# Patient Record
Sex: Male | Born: 1945 | Race: White | Hispanic: No | Marital: Married | Smoking: Never smoker
Health system: Southern US, Community
[De-identification: ages and names within clinical notes are randomized; demographics above are authoritative.]

## PROBLEM LIST (undated history)

## (undated) DIAGNOSIS — I10 Essential (primary) hypertension: Secondary | ICD-10-CM

## (undated) DIAGNOSIS — G4730 Sleep apnea, unspecified: Secondary | ICD-10-CM

## (undated) DIAGNOSIS — M1990 Unspecified osteoarthritis, unspecified site: Secondary | ICD-10-CM

## (undated) DIAGNOSIS — E119 Type 2 diabetes mellitus without complications: Secondary | ICD-10-CM

## (undated) DIAGNOSIS — J45909 Unspecified asthma, uncomplicated: Secondary | ICD-10-CM

## (undated) DIAGNOSIS — E785 Hyperlipidemia, unspecified: Secondary | ICD-10-CM

## (undated) HISTORY — DX: Essential (primary) hypertension: I10

## (undated) HISTORY — DX: Type 2 diabetes mellitus without complications: E11.9

## (undated) HISTORY — PX: COLONOSCOPY: SHX174

## (undated) HISTORY — DX: Sleep apnea, unspecified: G47.30

## (undated) HISTORY — PX: HERNIA REPAIR: SHX51

## (undated) HISTORY — DX: Unspecified asthma, uncomplicated: J45.909

## (undated) HISTORY — DX: Hyperlipidemia, unspecified: E78.5

---

## 2011-06-29 ENCOUNTER — Ambulatory Visit: Payer: Self-pay | Admitting: Gastroenterology

## 2011-06-29 LAB — HM COLONOSCOPY

## 2013-01-19 DIAGNOSIS — Z23 Encounter for immunization: Secondary | ICD-10-CM | POA: Diagnosis not present

## 2013-02-12 DIAGNOSIS — E7800 Pure hypercholesterolemia, unspecified: Secondary | ICD-10-CM | POA: Diagnosis not present

## 2013-02-12 DIAGNOSIS — Z0000 Encounter for general adult medical examination without abnormal findings: Secondary | ICD-10-CM | POA: Diagnosis not present

## 2013-02-12 DIAGNOSIS — Z125 Encounter for screening for malignant neoplasm of prostate: Secondary | ICD-10-CM | POA: Diagnosis not present

## 2013-02-12 DIAGNOSIS — I10 Essential (primary) hypertension: Secondary | ICD-10-CM | POA: Diagnosis not present

## 2013-02-12 DIAGNOSIS — E119 Type 2 diabetes mellitus without complications: Secondary | ICD-10-CM | POA: Diagnosis not present

## 2013-02-26 DIAGNOSIS — J019 Acute sinusitis, unspecified: Secondary | ICD-10-CM | POA: Diagnosis not present

## 2013-06-03 DIAGNOSIS — E119 Type 2 diabetes mellitus without complications: Secondary | ICD-10-CM | POA: Diagnosis not present

## 2013-06-03 DIAGNOSIS — E785 Hyperlipidemia, unspecified: Secondary | ICD-10-CM | POA: Diagnosis not present

## 2013-12-29 DIAGNOSIS — R062 Wheezing: Secondary | ICD-10-CM | POA: Diagnosis not present

## 2013-12-29 DIAGNOSIS — Z23 Encounter for immunization: Secondary | ICD-10-CM | POA: Diagnosis not present

## 2013-12-29 DIAGNOSIS — J209 Acute bronchitis, unspecified: Secondary | ICD-10-CM | POA: Diagnosis not present

## 2014-05-13 DIAGNOSIS — I10 Essential (primary) hypertension: Secondary | ICD-10-CM | POA: Diagnosis not present

## 2014-05-13 DIAGNOSIS — E785 Hyperlipidemia, unspecified: Secondary | ICD-10-CM | POA: Diagnosis not present

## 2014-05-13 DIAGNOSIS — E119 Type 2 diabetes mellitus without complications: Secondary | ICD-10-CM | POA: Diagnosis not present

## 2014-08-31 ENCOUNTER — Other Ambulatory Visit: Payer: Self-pay | Admitting: Family Medicine

## 2014-09-01 NOTE — Telephone Encounter (Signed)
Shouldn't be due until September- can we check with pharmacy?

## 2014-09-01 NOTE — Telephone Encounter (Signed)
Called and spoke with pharmacy. Patients prescription had [facility], they are calling to get it from the other pharmacy.

## 2014-11-13 ENCOUNTER — Telehealth: Payer: Self-pay

## 2014-11-13 DIAGNOSIS — I152 Hypertension secondary to endocrine disorders: Secondary | ICD-10-CM | POA: Insufficient documentation

## 2014-11-13 DIAGNOSIS — E1169 Type 2 diabetes mellitus with other specified complication: Secondary | ICD-10-CM | POA: Insufficient documentation

## 2014-11-13 DIAGNOSIS — E1165 Type 2 diabetes mellitus with hyperglycemia: Secondary | ICD-10-CM | POA: Insufficient documentation

## 2014-11-13 DIAGNOSIS — E785 Hyperlipidemia, unspecified: Secondary | ICD-10-CM | POA: Insufficient documentation

## 2014-11-13 DIAGNOSIS — E119 Type 2 diabetes mellitus without complications: Secondary | ICD-10-CM | POA: Insufficient documentation

## 2014-11-13 DIAGNOSIS — I10 Essential (primary) hypertension: Secondary | ICD-10-CM | POA: Insufficient documentation

## 2014-11-13 NOTE — Telephone Encounter (Signed)
[facility] [address] requesting refill Benazepril 40mg  Tab

## 2014-11-13 NOTE — Telephone Encounter (Signed)
Needs an appointment. Will get him enough medicine to make it to appointment when it's booked.   

## 2014-11-13 NOTE — Telephone Encounter (Signed)
No answer, voicemail left for patient to call and schedule an appointment, tell person scheduling to notify clinical staff that medication has been sent

## 2014-11-16 NOTE — Telephone Encounter (Signed)
Spoke with patients wife, she told patient to call and schedule an appointment.

## 2014-11-17 MED ORDER — BENAZEPRIL HCL 40 MG PO TABS: 40.0000 mg | ORAL_TABLET | Freq: Every day | ORAL | Status: DC

## 2014-11-17 NOTE — Telephone Encounter (Signed)
Rx sent to his pharmacy

## 2014-11-17 NOTE — Addendum Note (Signed)
Addended by: Valerie Roys on: 11/17/2014 02:37 PM   Modules accepted: Orders

## 2014-11-17 NOTE — Telephone Encounter (Signed)
Appointment made for 12/17/14

## 2014-12-17 ENCOUNTER — Encounter: Payer: Medicare Other | Admitting: Family Medicine

## 2014-12-22 ENCOUNTER — Other Ambulatory Visit: Payer: Self-pay | Admitting: Family Medicine

## 2014-12-22 NOTE — Telephone Encounter (Signed)
Your patient 

## 2014-12-30 ENCOUNTER — Encounter: Payer: Self-pay | Admitting: Family Medicine

## 2014-12-30 ENCOUNTER — Ambulatory Visit (INDEPENDENT_AMBULATORY_CARE_PROVIDER_SITE_OTHER): Payer: Medicare Other | Admitting: Family Medicine

## 2014-12-30 VITALS — BP 118/77 | HR 76 | Temp 98.1°F | Ht 70.0 in | Wt 245.0 lb

## 2014-12-30 DIAGNOSIS — E785 Hyperlipidemia, unspecified: Secondary | ICD-10-CM

## 2014-12-30 DIAGNOSIS — E119 Type 2 diabetes mellitus without complications: Secondary | ICD-10-CM | POA: Diagnosis not present

## 2014-12-30 DIAGNOSIS — N400 Enlarged prostate without lower urinary tract symptoms: Secondary | ICD-10-CM | POA: Diagnosis not present

## 2014-12-30 DIAGNOSIS — Z23 Encounter for immunization: Secondary | ICD-10-CM

## 2014-12-30 DIAGNOSIS — I10 Essential (primary) hypertension: Secondary | ICD-10-CM

## 2014-12-30 DIAGNOSIS — L821 Other seborrheic keratosis: Secondary | ICD-10-CM | POA: Diagnosis not present

## 2014-12-30 DIAGNOSIS — Z0000 Encounter for general adult medical examination without abnormal findings: Secondary | ICD-10-CM

## 2014-12-30 DIAGNOSIS — Z125 Encounter for screening for malignant neoplasm of prostate: Secondary | ICD-10-CM

## 2014-12-30 DIAGNOSIS — L570 Actinic keratosis: Secondary | ICD-10-CM | POA: Diagnosis not present

## 2014-12-30 LAB — URINALYSIS, ROUTINE W REFLEX MICROSCOPIC
Bilirubin, UA: NEGATIVE
Ketones, UA: NEGATIVE
Leukocytes, UA: NEGATIVE
Nitrite, UA: NEGATIVE
PH UA: 5 (ref 5.0–7.5)
Protein, UA: NEGATIVE
RBC, UA: NEGATIVE
Specific Gravity, UA: 1.025 (ref 1.005–1.030)
Urobilinogen, Ur: 0.2 mg/dL (ref 0.2–1.0)

## 2014-12-30 LAB — MICROALBUMIN, URINE WAIVED
CREATININE, URINE WAIVED: 200 mg/dL (ref 10–300)
Microalb, Ur Waived: 30 mg/L — ABNORMAL HIGH (ref 0–19)
Microalb/Creat Ratio: <30 mg/g (ref <30)

## 2014-12-30 LAB — BAYER DCA HB A1C WAIVED: HB A1C (BAYER DCA - WAIVED): 11.9 % — ABNORMAL HIGH (ref <7.0)

## 2014-12-30 MED ORDER — CANAGLIFLOZIN 300 MG PO TABS: 300.0000 mg | ORAL_TABLET | Freq: Every day | ORAL | Status: DC

## 2014-12-30 MED ORDER — BENAZEPRIL HCL 40 MG PO TABS: ORAL_TABLET | ORAL | Status: DC

## 2014-12-30 MED ORDER — SITAGLIPTIN PHOSPHATE 100 MG PO TABS: 100.0000 mg | ORAL_TABLET | Freq: Every day | ORAL | Status: DC

## 2014-12-30 MED ORDER — SAXAGLIPTIN HCL 5 MG PO TABS: 5.0000 mg | ORAL_TABLET | Freq: Every day | ORAL | Status: DC

## 2014-12-30 MED ORDER — ATORVASTATIN CALCIUM 20 MG PO TABS: 20.0000 mg | ORAL_TABLET | Freq: Every day | ORAL | Status: DC

## 2014-12-30 MED ORDER — METFORMIN HCL 500 MG PO TABS: 1000.0000 mg | ORAL_TABLET | Freq: Two times a day (BID) | ORAL | Status: DC

## 2014-12-30 MED ORDER — DAPAGLIFLOZIN PROPANEDIOL 10 MG PO TABS: 10.0000 mg | ORAL_TABLET | Freq: Every day | ORAL | Status: DC

## 2014-12-30 NOTE — Assessment & Plan Note (Addendum)
Discuss hemoglobin A1c terribly high at 11.9 extreme danger. Patient will begin aggressive dieting We'll start Onglyza and [place] Patient education given on May need to use alternative brands depending on cost patient will check with pharmacy Patient will check blood sugar once a day very and times for the next month Will recheck 1 month to assess improvement and control.

## 2014-12-30 NOTE — Addendum Note (Signed)
Addended byGolden Pop on: 12/30/2014 11:34 AM   Modules accepted: Orders, Medications, SmartSet

## 2014-12-30 NOTE — Addendum Note (Signed)
Addended byGolden Pop on: 12/30/2014 09:24 AM   Modules accepted: Orders, SmartSet

## 2014-12-30 NOTE — Addendum Note (Signed)
Addended byGolden Pop on: 12/30/2014 01:58 PM   Modules accepted: Orders, SmartSet

## 2014-12-30 NOTE — Progress Notes (Addendum)
BP 118/77 mmHg  Pulse 76  Temp(Src) 98.1 F (36.7 C)  Ht 5\' 10"  (1.778 m)  Wt 245 lb (111.131 kg)  BMI 35.15 kg/m2  SpO2 96%   Subjective:    Patient ID: Matthew [name], male    DOB: 04/05/1945, 69 y.o.   MRN: [id number]  HPI: Matthew [name] is a 69 y.o. male  Chief Complaint  Patient presents with  . Annual Exam   patient also for recheck of hypercholesterol taking Lipitor with no complaints takes medicines every day Takes Benzapril for hypertension doing well with good control of blood pressure no side effects Takes metformin to 500 mg tablets twice a day with no side effects noted low blood sugar spells did not get Januvia because it was too expensive and has noticed blood sugars have been elevated. Patient is lost 20 pounds but gained some back and is overall 11 pounds down so far this year.   Relevant past medical, surgical, family and social history reviewed and updated as indicated. Interim medical history since our last visit reviewed. Allergies and medications reviewed and updated.  Review of Systems  Constitutional: Negative.   HENT: Negative.        Wife complaints of patient not hearing well Patient uses Q-tips to clean ears  Eyes: Negative.   Respiratory: Negative.   Cardiovascular: Negative.   Gastrointestinal: Negative.   Endocrine: Negative.   Genitourinary: Negative.   Musculoskeletal: Negative.   Skin: Negative.        Patient with multiple actinic keratosis and seborrheic keratosis will occasionally scratching bleed primarily on patient's face  Allergic/Immunologic: Negative.   Neurological: Negative.   Hematological: Negative.   Psychiatric/Behavioral: Negative.     Per HPI unless specifically indicated above     Objective:    BP 118/77 mmHg  Pulse 76  Temp(Src) 98.1 F (36.7 C)  Ht 5\' 10"  (1.778 m)  Wt 245 lb (111.131 kg)  BMI 35.15 kg/m2  SpO2 96%  Wt Readings from Last 3 Encounters:  12/30/14 245 lb (111.131 kg)  05/13/14 254  lb (115.214 kg)    Physical Exam  Constitutional: He is oriented to person, place, and time. He appears well-developed and well-nourished.  HENT:  Head: Normocephalic and atraumatic.  Right Ear: External ear normal.  Left Ear: External ear normal.  Patient with impacted cerumen both ears Removed with water and instruments revealing normal canal and TM and relief of hearing symptoms.  Eyes: Conjunctivae and EOM are normal. Pupils are equal, round, and reactive to light.  Neck: Normal range of motion. Neck supple.  Cardiovascular: Normal rate, regular rhythm, normal heart sounds and intact distal pulses.   Pulmonary/Chest: Effort normal and breath sounds normal.  Abdominal: Soft. Bowel sounds are normal. There is no splenomegaly or hepatomegaly.  Genitourinary: Rectum normal and penis normal.  Prostate enlarged  Musculoskeletal: Normal range of motion.  Neurological: He is alert and oriented to person, place, and time. He has normal reflexes.  Skin: No rash noted. No erythema.  20+ actinic keratosis seborrheic keratosis destroyed on patient's face cryo-was used for destruction   Psychiatric: He has a normal mood and affect. His behavior is normal. Judgment and thought content normal.    No results found for this or any previous visit.    Assessment & Plan:   Problem List Items Addressed This Visit      Cardiovascular and Mediastinum   Hypertension   Relevant Medications   aspirin EC 325 MG tablet  atorvastatin (LIPITOR) 20 MG tablet   benazepril (LOTENSIN) 40 MG tablet   Other Relevant Orders   Comprehensive metabolic panel   CBC with Differential/Platelet   TSH     Endocrine   Diabetes mellitus without complication (HCC)    Discuss hemoglobin A1c terribly high at 11.9 extreme danger. Patient will begin aggressive dieting We'll start Onglyza and [place] Patient education given on May need to use alternative brands depending on cost patient will check with  pharmacy Patient will check blood sugar once a day very and times for the next month Will recheck 1 month to assess improvement and control.      Relevant Medications   aspirin EC 325 MG tablet   saxagliptin HCl (ONGLYZA) 5 MG TABS tablet   dapagliflozin propanediol (FARXIGA) 10 MG TABS tablet   atorvastatin (LIPITOR) 20 MG tablet   benazepril (LOTENSIN) 40 MG tablet   metFORMIN (GLUCOPHAGE) 500 MG tablet   Other Relevant Orders   Bayer DCA Hb A1c Waived   Comprehensive metabolic panel   CBC with Differential/Platelet   Urinalysis, Routine w reflex microscopic (not at [facility])   TSH   Microalbumin, Urine Waived     Genitourinary   BPH (benign prostatic hyperplasia)   Relevant Orders   PSA   Urinalysis, Routine w reflex microscopic (not at [facility])     Other   Hyperlipidemia   Relevant Medications   aspirin EC 325 MG tablet   atorvastatin (LIPITOR) 20 MG tablet   benazepril (LOTENSIN) 40 MG tablet   Other Relevant Orders   Comprehensive metabolic panel   Lipid panel   CBC with Differential/Platelet   Urinalysis, Routine w reflex microscopic (not at [facility])   TSH    Other Visit Diagnoses    Immunization due    -  Primary    Relevant Orders    Flu Vaccine QUAD 36+ mos PF IM (Fluarix & Fluzone Quad PF) (Completed)    Pneumococcal conjugate vaccine 13-valent IM (Completed)    PE (physical exam), annual            Follow up plan: Return in about 4 weeks (around 01/27/2015), or if symptoms worsen or fail to improve, for Recheck glucose log.

## 2014-12-30 NOTE — Addendum Note (Signed)
Addended byGolden Pop on: 12/30/2014 10:38 AM   Modules accepted: Miquel Dunn

## 2014-12-31 ENCOUNTER — Encounter: Payer: Self-pay | Admitting: Family Medicine

## 2014-12-31 LAB — CBC WITH DIFFERENTIAL/PLATELET
BASOS ABS: 0 10*3/uL (ref 0.0–0.2)
Basos: 0 %
EOS (ABSOLUTE): 0.2 10*3/uL (ref 0.0–0.4)
Eos: 3 %
Hematocrit: 43.5 % (ref 37.5–51.0)
Hemoglobin: 14.4 g/dL (ref 12.6–17.7)
IMMATURE GRANS (ABS): 0 10*3/uL (ref 0.0–0.1)
IMMATURE GRANULOCYTES: 0 %
LYMPHS: 32 %
Lymphocytes Absolute: 2 10*3/uL (ref 0.7–3.1)
MCH: 28.3 pg (ref 26.6–33.0)
MCHC: 33.1 g/dL (ref 31.5–35.7)
MCV: 86 fL (ref 79–97)
Monocytes Absolute: 0.6 10*3/uL (ref 0.1–0.9)
Monocytes: 10 %
NEUTROS PCT: 55 %
Neutrophils Absolute: 3.4 10*3/uL (ref 1.4–7.0)
PLATELETS: 232 10*3/uL (ref 150–379)
RBC: 5.09 x10E6/uL (ref 4.14–5.80)
RDW: 13.6 % (ref 12.3–15.4)
WBC: 6.2 10*3/uL (ref 3.4–10.8)

## 2014-12-31 LAB — LIPID PANEL
CHOL/HDL RATIO: 3.3 ratio (ref 0.0–5.0)
CHOLESTEROL TOTAL: 151 mg/dL (ref 100–199)
HDL: 46 mg/dL (ref >39)
LDL CALC: 81 mg/dL (ref 0–99)
Triglycerides: 118 mg/dL (ref 0–149)
VLDL Cholesterol Cal: 24 mg/dL (ref 5–40)

## 2014-12-31 LAB — COMPREHENSIVE METABOLIC PANEL
A/G RATIO: 2 (ref 1.1–2.5)
ALK PHOS: 75 IU/L (ref 39–117)
ALT: 28 IU/L (ref 0–44)
AST: 19 IU/L (ref 0–40)
Albumin: 4.3 g/dL (ref 3.6–4.8)
BILIRUBIN TOTAL: 0.4 mg/dL (ref 0.0–1.2)
BUN/Creatinine Ratio: 20 (ref 10–22)
BUN: 19 mg/dL (ref 8–27)
CHLORIDE: 98 mmol/L (ref 97–106)
CO2: 22 mmol/L (ref 18–29)
Calcium: 9.5 mg/dL (ref 8.6–10.2)
Creatinine, Ser: 0.94 mg/dL (ref 0.76–1.27)
GFR calc Af Amer: 97 mL/min/{1.73_m2} (ref >59)
GFR calc non Af Amer: 84 mL/min/{1.73_m2} (ref >59)
GLUCOSE: 265 mg/dL — AB (ref 65–99)
Globulin, Total: 2.2 g/dL (ref 1.5–4.5)
POTASSIUM: 4.5 mmol/L (ref 3.5–5.2)
Sodium: 136 mmol/L (ref 136–144)
Total Protein: 6.5 g/dL (ref 6.0–8.5)

## 2014-12-31 LAB — PSA: PROSTATE SPECIFIC AG, SERUM: 1 ng/mL (ref 0.0–4.0)

## 2014-12-31 LAB — TSH: TSH: 2.1 u[IU]/mL (ref 0.450–4.500)

## 2015-01-26 ENCOUNTER — Encounter: Payer: Self-pay | Admitting: Family Medicine

## 2015-01-26 ENCOUNTER — Ambulatory Visit (INDEPENDENT_AMBULATORY_CARE_PROVIDER_SITE_OTHER): Payer: Medicare Other | Admitting: Family Medicine

## 2015-01-26 VITALS — BP 115/69 | HR 76 | Temp 98.6°F | Ht 70.0 in | Wt 238.0 lb

## 2015-01-26 DIAGNOSIS — E785 Hyperlipidemia, unspecified: Secondary | ICD-10-CM

## 2015-01-26 DIAGNOSIS — I10 Essential (primary) hypertension: Secondary | ICD-10-CM | POA: Diagnosis not present

## 2015-01-26 DIAGNOSIS — E119 Type 2 diabetes mellitus without complications: Secondary | ICD-10-CM

## 2015-01-26 NOTE — Progress Notes (Signed)
BP 115/69 mmHg  Pulse 76  Temp(Src) 98.6 F (37 C)  Ht 5\' 10"  (1.778 m)  Wt 238 lb (107.956 kg)  BMI 34.15 kg/m2  SpO2 96%   Subjective:    Patient ID: Matthew [name], male    DOB: [birth year], 69 y.o.   MRN: [id number]  HPI: Matthew [name] is a 69 y.o. male  Chief Complaint  Patient presents with  . Diabetes   agents been working with pharmacist gets list of medicines he can afford has bought insurance that will start January 1 in the meantime she is to free coupon but is running out Has lost 7 pounds and blood sugars dropped dramatically into the low 200s and mid 100s. No complaints from medications Patient also getting blood pressure cholesterol medicine and has a savings on that.   Relevant past medical, surgical, family and social history reviewed and updated as indicated. Interim medical history since our last visit reviewed. Allergies and medications reviewed and updated.  Review of Systems  Constitutional: Negative.   Respiratory: Negative.   Cardiovascular: Negative.     Per HPI unless specifically indicated above     Objective:    BP 115/69 mmHg  Pulse 76  Temp(Src) 98.6 F (37 C)  Ht 5\' 10"  (1.778 m)  Wt 238 lb (107.956 kg)  BMI 34.15 kg/m2  SpO2 96%  Wt Readings from Last 3 Encounters:  01/26/15 238 lb (107.956 kg)  12/30/14 245 lb (111.131 kg)  05/13/14 254 lb (115.214 kg)    Physical Exam  Constitutional: He is oriented to person, place, and time. He appears well-developed and well-nourished. No distress.  HENT:  Head: Normocephalic and atraumatic.  Right Ear: Hearing normal.  Left Ear: Hearing normal.  Nose: Nose normal.  Eyes: Conjunctivae and lids are normal. Right eye exhibits no discharge. Left eye exhibits no discharge. No scleral icterus.  Cardiovascular: Normal rate, regular rhythm and normal heart sounds.   Pulmonary/Chest: Effort normal and breath sounds normal. No respiratory distress.  Musculoskeletal: Normal range of motion.   Neurological: He is alert and oriented to person, place, and time.  Skin: Skin is intact. No rash noted.  Psychiatric: He has a normal mood and affect. His speech is normal and behavior is normal. Judgment and thought content normal. Cognition and memory are normal.    Results for orders placed or performed in visit on 12/30/14  Bayer DCA Hb A1c Waived  Result Value Ref Range   Bayer DCA Hb A1c Waived 11.9 (H) <7.0 %  Comprehensive metabolic panel  Result Value Ref Range   Glucose 265 (H) 65 - 99 mg/dL   BUN 19 8 - 27 mg/dL   Creatinine, Ser 0.94 0.76 - 1.27 mg/dL   GFR calc non Af Amer 84 >59 mL/min/1.73   GFR calc Af Amer 97 >59 mL/min/1.73   BUN/Creatinine Ratio 20 10 - 22   Sodium 136 136 - 144 mmol/L   Potassium 4.5 3.5 - 5.2 mmol/L   Chloride 98 97 - 106 mmol/L   CO2 22 18 - 29 mmol/L   Calcium 9.5 8.6 - 10.2 mg/dL   Total Protein 6.5 6.0 - 8.5 g/dL   Albumin 4.3 3.6 - 4.8 g/dL   Globulin, Total 2.2 1.5 - 4.5 g/dL   Albumin/Globulin Ratio 2.0 1.1 - 2.5   Bilirubin Total 0.4 0.0 - 1.2 mg/dL   Alkaline Phosphatase 75 39 - 117 IU/L   AST 19 0 - 40 IU/L   ALT 28  0 - 44 IU/L  Lipid panel  Result Value Ref Range   Cholesterol, Total 151 100 - 199 mg/dL   Triglycerides 118 0 - 149 mg/dL   HDL 46 >39 mg/dL   VLDL Cholesterol Cal 24 5 - 40 mg/dL   LDL Calculated 81 0 - 99 mg/dL   Chol/HDL Ratio 3.3 0.0 - 5.0 ratio units  CBC with Differential/Platelet  Result Value Ref Range   WBC 6.2 3.4 - 10.8 x10E3/uL   RBC 5.09 4.14 - 5.80 x10E6/uL   Hemoglobin 14.4 12.6 - 17.7 g/dL   Hematocrit 43.5 37.5 - 51.0 %   MCV 86 79 - 97 fL   MCH 28.3 26.6 - 33.0 pg   MCHC 33.1 31.5 - 35.7 g/dL   RDW 13.6 12.3 - 15.4 %   Platelets 232 150 - 379 x10E3/uL   Neutrophils 55 %   Lymphs 32 %   Monocytes 10 %   Eos 3 %   Basos 0 %   Neutrophils Absolute 3.4 1.4 - 7.0 x10E3/uL   Lymphocytes Absolute 2.0 0.7 - 3.1 x10E3/uL   Monocytes Absolute 0.6 0.1 - 0.9 x10E3/uL   EOS (ABSOLUTE) 0.2  0.0 - 0.4 x10E3/uL   Basophils Absolute 0.0 0.0 - 0.2 x10E3/uL   Immature Granulocytes 0 %   Immature Grans (Abs) 0.0 0.0 - 0.1 x10E3/uL  PSA  Result Value Ref Range   Prostate Specific Ag, Serum 1.0 0.0 - 4.0 ng/mL  Urinalysis, Routine w reflex microscopic (not at [facility])  Result Value Ref Range   Specific Gravity, UA 1.025 1.005 - 1.030   pH, UA 5.0 5.0 - 7.5   Color, UA Yellow Yellow   Appearance Ur Clear Clear   Leukocytes, UA Negative Negative   Protein, UA Negative Negative/Trace   Glucose, UA 3+ (A) Negative   Ketones, UA Negative Negative   RBC, UA Negative Negative   Bilirubin, UA Negative Negative   Urobilinogen, Ur 0.2 0.2 - 1.0 mg/dL   Nitrite, UA Negative Negative  TSH  Result Value Ref Range   TSH 2.100 0.450 - 4.500 uIU/mL  Microalbumin, Urine Waived  Result Value Ref Range   Microalb, Ur Waived 30 (H) 0 - 19 mg/L   Creatinine, Urine Waived 200 10 - 300 mg/dL   Microalb/Creat Ratio <30 <30 mg/g      Assessment & Plan:   Problem List Items Addressed This Visit      Cardiovascular and Mediastinum   Essential hypertension    The current medical regimen is effective;  continue present plan and medications.         Endocrine   Diabetes mellitus without complication ([place]) - Primary    Discuss due to free [place] running out insurance not starting to January 1 will start Bydureon pen today 4 samples        Other   Hyperlipidemia    The current medical regimen is effective;  continue present plan and medications.           Follow up plan: Return in about 2 months (around 03/28/2015) for Repeat hemoglobin A1c to evaluate weight loss and medications.

## 2015-01-26 NOTE — Assessment & Plan Note (Signed)
Discuss due to free Invok[employer] running out insurance not starting to January 1 will start Bydureon pen today 4 samples

## 2015-01-26 NOTE — Assessment & Plan Note (Signed)
The current medical regimen is effective;  continue present plan and medications.  

## 2015-03-11 DIAGNOSIS — M25512 Pain in left shoulder: Secondary | ICD-10-CM | POA: Diagnosis not present

## 2015-03-11 DIAGNOSIS — R05 Cough: Secondary | ICD-10-CM | POA: Diagnosis not present

## 2015-03-19 DIAGNOSIS — M25512 Pain in left shoulder: Secondary | ICD-10-CM | POA: Diagnosis not present

## 2015-03-20 DIAGNOSIS — M25512 Pain in left shoulder: Secondary | ICD-10-CM | POA: Diagnosis not present

## 2015-03-29 DIAGNOSIS — M7582 Other shoulder lesions, left shoulder: Secondary | ICD-10-CM | POA: Diagnosis not present

## 2015-03-29 DIAGNOSIS — M75122 Complete rotator cuff tear or rupture of left shoulder, not specified as traumatic: Secondary | ICD-10-CM | POA: Diagnosis not present

## 2015-04-06 ENCOUNTER — Ambulatory Visit: Payer: Medicare Other | Admitting: Family Medicine

## 2015-05-06 ENCOUNTER — Encounter: Payer: Self-pay | Admitting: *Deleted

## 2015-05-25 ENCOUNTER — Ambulatory Visit: Payer: Medicare HMO | Admitting: Family Medicine

## 2015-06-23 ENCOUNTER — Ambulatory Visit: Payer: Medicare HMO | Admitting: Family Medicine

## 2015-10-01 ENCOUNTER — Telehealth: Payer: Self-pay | Admitting: Family Medicine

## 2015-10-01 NOTE — Telephone Encounter (Signed)
Called pt stated he is supposed to be getting lancets and a meter from the a diabetic supply company. Pt scheduled an appt for 10/2015. Please advise if pt needs to be seen sooner to get these orders signed. Thanks.

## 2015-10-01 NOTE — Telephone Encounter (Signed)
As patient has confirmed request and has appointment scheduled we can sign order.

## 2015-11-15 ENCOUNTER — Encounter: Payer: Self-pay | Admitting: Family Medicine

## 2015-11-15 ENCOUNTER — Ambulatory Visit (INDEPENDENT_AMBULATORY_CARE_PROVIDER_SITE_OTHER): Payer: Medicare HMO | Admitting: Family Medicine

## 2015-11-15 VITALS — BP 113/71 | HR 80 | Temp 98.1°F | Ht 70.2 in | Wt 239.0 lb

## 2015-11-15 DIAGNOSIS — E119 Type 2 diabetes mellitus without complications: Secondary | ICD-10-CM

## 2015-11-15 DIAGNOSIS — E785 Hyperlipidemia, unspecified: Secondary | ICD-10-CM | POA: Diagnosis not present

## 2015-11-15 DIAGNOSIS — I10 Essential (primary) hypertension: Secondary | ICD-10-CM

## 2015-11-15 DIAGNOSIS — Z23 Encounter for immunization: Secondary | ICD-10-CM

## 2015-11-15 LAB — LP+ALT+AST PICCOLO, WAIVED
ALT (SGPT) Piccolo, Waived: 30 U/L (ref 10–47)
AST (SGOT) PICCOLO, WAIVED: 33 U/L (ref 11–38)
CHOL/HDL RATIO PICCOLO,WAIVE: 2.8 mg/dL
CHOLESTEROL PICCOLO, WAIVED: 154 mg/dL (ref <200)
HDL Chol Piccolo, Waived: 55 mg/dL — ABNORMAL LOW (ref >59)
LDL Chol Calc Piccolo Waived: 71 mg/dL (ref <100)
Triglycerides Piccolo,Waived: 145 mg/dL (ref <150)
VLDL Chol Calc Piccolo,Waive: 29 mg/dL (ref <30)

## 2015-11-15 LAB — BAYER DCA HB A1C WAIVED: HB A1C (BAYER DCA - WAIVED): 7.4 % — ABNORMAL HIGH (ref <7.0)

## 2015-11-15 NOTE — Assessment & Plan Note (Signed)
The current medical regimen is effective;  continue present plan and medications.  

## 2015-11-15 NOTE — Progress Notes (Signed)
BP 113/71 (BP Location: Left Arm, Patient Position: Sitting, Cuff Size: Normal)   Pulse 80   Temp 98.1 F (36.7 C)   Ht 5' 10.2" (1.783 m)   Wt 239 lb (108.4 kg)   SpO2 98%   BMI 34.10 kg/m    Subjective:    Patient ID: Matthew [name], male    DOB: 1945-08-13, 70 y.o.   MRN: [id number]  HPI: Matthew [name] is a 70 y.o. male  Chief Complaint  Patient presents with  . Diabetes  . Hypertension  . Hyperlipidemia  Patient follow-up doing well all in all no complaints with medications has had some compliance issues because had a 2 week trip and only had metformin with him. Blood sugar did go up during this time but was walking more. Blood pressure doing well no complaints Cholesterol is also been doing well.  Relevant past medical, surgical, family and social history reviewed and updated as indicated. Interim medical history since our last visit reviewed. Allergies and medications reviewed and updated.  Review of Systems  Constitutional: Negative.   Respiratory: Negative.   Cardiovascular: Negative.     Per HPI unless specifically indicated above     Objective:    BP 113/71 (BP Location: Left Arm, Patient Position: Sitting, Cuff Size: Normal)   Pulse 80   Temp 98.1 F (36.7 C)   Ht 5' 10.2" (1.783 m)   Wt 239 lb (108.4 kg)   SpO2 98%   BMI 34.10 kg/m   Wt Readings from Last 3 Encounters:  11/15/15 239 lb (108.4 kg)  01/26/15 238 lb (108 kg)  12/30/14 245 lb (111.1 kg)    Physical Exam  Constitutional: He is oriented to person, place, and time. He appears well-developed and well-nourished. No distress.  HENT:  Head: Normocephalic and atraumatic.  Right Ear: Hearing normal.  Left Ear: Hearing normal.  Nose: Nose normal.  Eyes: Conjunctivae and lids are normal. Right eye exhibits no discharge. Left eye exhibits no discharge. No scleral icterus.  Cardiovascular: Normal rate, regular rhythm and normal heart sounds.   Pulmonary/Chest: Effort normal and breath  sounds normal. No respiratory distress.  Musculoskeletal: Normal range of motion.  Neurological: He is alert and oriented to person, place, and time.  Skin: Skin is intact. No rash noted.  Psychiatric: He has a normal mood and affect. His speech is normal and behavior is normal. Judgment and thought content normal. Cognition and memory are normal.    Results for orders placed or performed in visit on 12/30/14  Bayer DCA Hb A1c Waived  Result Value Ref Range   Bayer DCA Hb A1c Waived 11.9 (H) <7.0 %  Comprehensive metabolic panel  Result Value Ref Range   Glucose 265 (H) 65 - 99 mg/dL   BUN 19 8 - 27 mg/dL   Creatinine, Ser 0.94 0.76 - 1.27 mg/dL   GFR calc non Af Amer 84 >59 mL/min/1.73   GFR calc Af Amer 97 >59 mL/min/1.73   BUN/Creatinine Ratio 20 10 - 22   Sodium 136 136 - 144 mmol/L   Potassium 4.5 3.5 - 5.2 mmol/L   Chloride 98 97 - 106 mmol/L   CO2 22 18 - 29 mmol/L   Calcium 9.5 8.6 - 10.2 mg/dL   Total Protein 6.5 6.0 - 8.5 g/dL   Albumin 4.3 3.6 - 4.8 g/dL   Globulin, Total 2.2 1.5 - 4.5 g/dL   Albumin/Globulin Ratio 2.0 1.1 - 2.5   Bilirubin Total 0.4 0.0 -  1.2 mg/dL   Alkaline Phosphatase 75 39 - 117 IU/L   AST 19 0 - 40 IU/L   ALT 28 0 - 44 IU/L  Lipid panel  Result Value Ref Range   Cholesterol, Total 151 100 - 199 mg/dL   Triglycerides 118 0 - 149 mg/dL   HDL 46 >39 mg/dL   VLDL Cholesterol Cal 24 5 - 40 mg/dL   LDL Calculated 81 0 - 99 mg/dL   Chol/HDL Ratio 3.3 0.0 - 5.0 ratio units  CBC with Differential/Platelet  Result Value Ref Range   WBC 6.2 3.4 - 10.8 x10E3/uL   RBC 5.09 4.14 - 5.80 x10E6/uL   Hemoglobin 14.4 12.6 - 17.7 g/dL   Hematocrit 43.5 37.5 - 51.0 %   MCV 86 79 - 97 fL   MCH 28.3 26.6 - 33.0 pg   MCHC 33.1 31.5 - 35.7 g/dL   RDW 13.6 12.3 - 15.4 %   Platelets 232 150 - 379 x10E3/uL   Neutrophils 55 %   Lymphs 32 %   Monocytes 10 %   Eos 3 %   Basos 0 %   Neutrophils Absolute 3.4 1.4 - 7.0 x10E3/uL   Lymphocytes Absolute 2.0 0.7 -  3.1 x10E3/uL   Monocytes Absolute 0.6 0.1 - 0.9 x10E3/uL   EOS (ABSOLUTE) 0.2 0.0 - 0.4 x10E3/uL   Basophils Absolute 0.0 0.0 - 0.2 x10E3/uL   Immature Granulocytes 0 %   Immature Grans (Abs) 0.0 0.0 - 0.1 x10E3/uL  PSA  Result Value Ref Range   Prostate Specific Ag, Serum 1.0 0.0 - 4.0 ng/mL  Urinalysis, Routine w reflex microscopic (not at [facility])  Result Value Ref Range   Specific Gravity, UA 1.025 1.005 - 1.030   pH, UA 5.0 5.0 - 7.5   Color, UA Yellow Yellow   Appearance Ur Clear Clear   Leukocytes, UA Negative Negative   Protein, UA Negative Negative/Trace   Glucose, UA 3+ (A) Negative   Ketones, UA Negative Negative   RBC, UA Negative Negative   Bilirubin, UA Negative Negative   Urobilinogen, Ur 0.2 0.2 - 1.0 mg/dL   Nitrite, UA Negative Negative  TSH  Result Value Ref Range   TSH 2.100 0.450 - 4.500 uIU/mL  Microalbumin, Urine Waived  Result Value Ref Range   Microalb, Ur Waived 30 (H) 0 - 19 mg/L   Creatinine, Urine Waived 200 10 - 300 mg/dL   Microalb/Creat Ratio <30 <30 mg/g      Assessment & Plan:   Problem List Items Addressed This Visit      Cardiovascular and Mediastinum   Essential hypertension    The current medical regimen is effective;  continue present plan and medications.       Relevant Orders   Bayer DCA Hb A1c Waived   LP+ALT+AST [place], Waived   Basic metabolic panel     Endocrine   Diabetes mellitus without complication ([place]) - Primary    Tremendously improved blood sugars Has had 6 pound weight loss Discuss compliance with medications discuss continued weight loss Give glucometer prescription and test strip prescription      Relevant Orders   Bayer DCA Hb A1c Waived   LP+ALT+AST [place], Waived   Basic metabolic panel     Other   Hyperlipidemia    The current medical regimen is effective;  continue present plan and medications.       Relevant Orders   Bayer DCA Hb A1c Waived   LP+ALT+AST [place], [place]   Basic  metabolic  panel    Other Visit Diagnoses    Immunization due       Relevant Orders   Flu vaccine HIGH DOSE PF (Fluzone High dose) (Completed)       Follow up plan: Return in about 3 months (around 02/14/2016) for Physical Exam, Hemoglobin A1c.

## 2015-11-15 NOTE — Assessment & Plan Note (Signed)
Tremendously improved blood sugars Has had 6 pound weight loss Discuss compliance with medications discuss continued weight loss Give glucometer prescription and test strip prescription

## 2015-11-15 NOTE — Patient Instructions (Addendum)

## 2015-11-16 ENCOUNTER — Encounter: Payer: Self-pay | Admitting: Family Medicine

## 2015-11-16 LAB — BASIC METABOLIC PANEL
BUN / CREAT RATIO: 22 (ref 10–24)
BUN: 23 mg/dL (ref 8–27)
CHLORIDE: 101 mmol/L (ref 96–106)
CO2: 20 mmol/L (ref 18–29)
Calcium: 9.5 mg/dL (ref 8.6–10.2)
Creatinine, Ser: 1.04 mg/dL (ref 0.76–1.27)
GFR, EST AFRICAN AMERICAN: 85 mL/min/{1.73_m2} (ref >59)
GFR, EST NON AFRICAN AMERICAN: 73 mL/min/{1.73_m2} (ref >59)
Glucose: 107 mg/dL — ABNORMAL HIGH (ref 65–99)
POTASSIUM: 4.9 mmol/L (ref 3.5–5.2)
Sodium: 139 mmol/L (ref 134–144)

## 2016-01-13 DIAGNOSIS — Z6831 Body mass index (BMI) 31.0-31.9, adult: Secondary | ICD-10-CM | POA: Diagnosis not present

## 2016-01-13 DIAGNOSIS — I10 Essential (primary) hypertension: Secondary | ICD-10-CM | POA: Diagnosis not present

## 2016-01-13 DIAGNOSIS — Z0000 Encounter for general adult medical examination without abnormal findings: Secondary | ICD-10-CM | POA: Diagnosis not present

## 2016-01-13 DIAGNOSIS — E119 Type 2 diabetes mellitus without complications: Secondary | ICD-10-CM | POA: Diagnosis not present

## 2016-01-13 DIAGNOSIS — E785 Hyperlipidemia, unspecified: Secondary | ICD-10-CM | POA: Diagnosis not present

## 2016-02-05 ENCOUNTER — Other Ambulatory Visit: Payer: Self-pay | Admitting: Family Medicine

## 2016-02-05 DIAGNOSIS — E119 Type 2 diabetes mellitus without complications: Secondary | ICD-10-CM

## 2016-02-07 NOTE — Telephone Encounter (Signed)
Routing to provider. Appt on 03/13/16.

## 2016-03-13 ENCOUNTER — Encounter: Payer: Medicare HMO | Admitting: Family Medicine

## 2016-04-19 ENCOUNTER — Encounter: Payer: Medicare HMO | Admitting: Family Medicine

## 2016-05-08 ENCOUNTER — Other Ambulatory Visit: Payer: Self-pay | Admitting: Family Medicine

## 2016-05-08 DIAGNOSIS — I10 Essential (primary) hypertension: Secondary | ICD-10-CM

## 2016-05-08 DIAGNOSIS — E785 Hyperlipidemia, unspecified: Secondary | ICD-10-CM

## 2016-05-08 DIAGNOSIS — E119 Type 2 diabetes mellitus without complications: Secondary | ICD-10-CM

## 2016-05-08 NOTE — Telephone Encounter (Signed)
Last OV: 11/15/15 Next OV: 05/23/16  BMP Latest Ref Rng & Units 11/15/2015 12/30/2014  Glucose 65 - 99 mg/dL 107(H) 265(H)  BUN 8 - 27 mg/dL 23 19  Creatinine 0.76 - 1.27 mg/dL 1.04 0.94  BUN/Creat Ratio 10 - 24 22 20   Sodium 134 - 144 mmol/L 139 136  Potassium 3.5 - 5.2 mmol/L 4.9 4.5  Chloride 96 - 106 mmol/L 101 98  CO2 18 - 29 mmol/L 20 22  Calcium 8.6 - 10.2 mg/dL 9.5 9.5    Lab Results  Component Value Date   CHOL 154 11/15/2015   HDL 46 12/30/2014   LDLCALC 81 12/30/2014   TRIG 145 11/15/2015   CHOLHDL 3.3 12/30/2014   Lab Results  Component Value Date   CREATININE 1.04 11/15/2015   BUN 23 11/15/2015   NA 139 11/15/2015   K 4.9 11/15/2015   CL 101 11/15/2015   CO2 20 11/15/2015

## 2016-05-23 ENCOUNTER — Ambulatory Visit (INDEPENDENT_AMBULATORY_CARE_PROVIDER_SITE_OTHER): Payer: Medicare HMO | Admitting: Family Medicine

## 2016-05-23 ENCOUNTER — Encounter: Payer: Self-pay | Admitting: Family Medicine

## 2016-05-23 VITALS — BP 124/74 | HR 95 | Ht 69.41 in | Wt 249.1 lb

## 2016-05-23 DIAGNOSIS — N400 Enlarged prostate without lower urinary tract symptoms: Secondary | ICD-10-CM

## 2016-05-23 DIAGNOSIS — E119 Type 2 diabetes mellitus without complications: Secondary | ICD-10-CM | POA: Diagnosis not present

## 2016-05-23 DIAGNOSIS — E7800 Pure hypercholesterolemia, unspecified: Secondary | ICD-10-CM | POA: Diagnosis not present

## 2016-05-23 DIAGNOSIS — Z0000 Encounter for general adult medical examination without abnormal findings: Secondary | ICD-10-CM | POA: Diagnosis not present

## 2016-05-23 DIAGNOSIS — E785 Hyperlipidemia, unspecified: Secondary | ICD-10-CM

## 2016-05-23 DIAGNOSIS — Z125 Encounter for screening for malignant neoplasm of prostate: Secondary | ICD-10-CM

## 2016-05-23 DIAGNOSIS — Z1329 Encounter for screening for other suspected endocrine disorder: Secondary | ICD-10-CM

## 2016-05-23 DIAGNOSIS — I10 Essential (primary) hypertension: Secondary | ICD-10-CM | POA: Diagnosis not present

## 2016-05-23 LAB — URINALYSIS, ROUTINE W REFLEX MICROSCOPIC
Bilirubin, UA: NEGATIVE
KETONES UA: NEGATIVE
Leukocytes, UA: NEGATIVE
Nitrite, UA: NEGATIVE
PROTEIN UA: NEGATIVE
RBC UA: NEGATIVE
SPEC GRAV UA: 1.015 (ref 1.005–1.030)
Urobilinogen, Ur: 0.2 mg/dL (ref 0.2–1.0)
pH, UA: 5.5 (ref 5.0–7.5)

## 2016-05-23 LAB — MICROSCOPIC EXAMINATION
BACTERIA UA: NONE SEEN
RBC MICROSCOPIC, UA: NONE SEEN /HPF (ref 0–?)
WBC, UA: NONE SEEN /hpf (ref 0–?)

## 2016-05-23 LAB — HEMOGLOBIN A1C
ESTIMATED AVERAGE GLUCOSE: 194 mg/dL
HEMOGLOBIN A1C: 8.4 % — AB (ref 4.8–5.6)

## 2016-05-23 MED ORDER — ATORVASTATIN CALCIUM 20 MG PO TABS: 20.0000 mg | ORAL_TABLET | Freq: Every day | ORAL | 4 refills | Status: DC

## 2016-05-23 MED ORDER — SITAGLIPTIN PHOSPHATE 100 MG PO TABS: 100.0000 mg | ORAL_TABLET | Freq: Every day | ORAL | 4 refills | Status: DC

## 2016-05-23 MED ORDER — CANAGLIFLOZIN 300 MG PO TABS: 300.0000 mg | ORAL_TABLET | Freq: Every day | ORAL | 4 refills | Status: DC

## 2016-05-23 MED ORDER — METFORMIN HCL 500 MG PO TABS: 1000.0000 mg | ORAL_TABLET | Freq: Two times a day (BID) | ORAL | 4 refills | Status: DC

## 2016-05-23 MED ORDER — BENAZEPRIL HCL 40 MG PO TABS: 40.0000 mg | ORAL_TABLET | Freq: Every day | ORAL | 4 refills | Status: DC

## 2016-05-23 NOTE — Progress Notes (Signed)
BP 124/74 (BP Location: Left Arm)   Pulse 95   Ht 5' 9.41" (1.763 m)   Wt 249 lb 1.6 oz (113 kg)   SpO2 96%   BMI 36.35 kg/m    Subjective:    Patient ID: Matthew [name], male    DOB: 06-Oct-1945, 71 y.o.   MRN: [id number]  HPI: Matthew [name] is a 71 y.o. male  Chief Complaint  Patient presents with  . Annual Exam  AWV metrics met Patient concerned has some itching type sensation in both the ears left ear especially Reviewed on patient's diabetes and A1c's backup at 8.4. Reviewed patient's weight and is up 10 pounds over this last winter. Patient taking medications faithfully. Cholesterol blood pressure doing well no complaints from medications.  Releva cholesterol blood pressure doing nt past medical, surgical, family and social history reviewed and updated as indicated. Interim medical history since our last visit reviewed. Allergies and medications reviewed and updated.  Review of Systems  Constitutional: Negative.   HENT: Negative.   Eyes: Negative.   Respiratory: Negative.   Cardiovascular: Negative.   Gastrointestinal: Negative.   Endocrine: Negative.   Genitourinary: Negative.   Musculoskeletal: Negative.   Skin: Negative.   Allergic/Immunologic: Negative.   Neurological: Negative.   Hematological: Negative.   Psychiatric/Behavioral: Negative.     Per HPI unless specifically indicated above     Objective:    BP 124/74 (BP Location: Left Arm)   Pulse 95   Ht 5' 9.41" (1.763 m)   Wt 249 lb 1.6 oz (113 kg)   SpO2 96%   BMI 36.35 kg/m   Wt Readings from Last 3 Encounters:  05/23/16 249 lb 1.6 oz (113 kg)  11/15/15 239 lb (108.4 kg)  01/26/15 238 lb (108 kg)    Physical Exam  Constitutional: He is oriented to person, place, and time. He appears well-developed and well-nourished.  HENT:  Head: Normocephalic and atraumatic.  Right Ear: External ear normal.  Left Ear: External ear normal.  Eyes: Conjunctivae and EOM are normal. Pupils are equal,  round, and reactive to light.  Neck: Normal range of motion. Neck supple.  Cardiovascular: Normal rate, regular rhythm, normal heart sounds and intact distal pulses.   Pulmonary/Chest: Effort normal and breath sounds normal.  Abdominal: Soft. Bowel sounds are normal. There is no splenomegaly or hepatomegaly.  Genitourinary: Rectum normal and penis normal.  Genitourinary Comments: BPH changes  Musculoskeletal: Normal range of motion.  Neurological: He is alert and oriented to person, place, and time. He has normal reflexes.  Skin: No rash noted. No erythema.  Psychiatric: He has a normal mood and affect. His behavior is normal. Judgment and thought content normal.    Results for orders placed or performed in visit on 11/15/15  Bayer DCA Hb A1c Waived  Result Value Ref Range   Bayer DCA Hb A1c Waived 7.4 (H) <7.0 %  LP+ALT+AST Piccolo, Waived  Result Value Ref Range   ALT (SGPT) Piccolo, Waived 30 10 - 47 U/L   AST (SGOT) Piccolo, Waived 33 11 - 38 U/L   Cholesterol Piccolo, Waived 154 <200 mg/dL   HDL Chol Piccolo, Waived 55 (L) >59 mg/dL   Triglycerides Piccolo,Waived 145 <150 mg/dL   Chol/HDL Ratio Piccolo,Waive 2.8 mg/dL   LDL Chol Calc Piccolo Waived 71 <100 mg/dL   VLDL Chol Calc Piccolo,Waive 29 <30 mg/dL  Basic metabolic panel  Result Value Ref Range   Glucose 107 (H) 65 - 99 mg/dL  BUN 23 8 - 27 mg/dL   Creatinine, Ser 1.04 0.76 - 1.27 mg/dL   GFR calc non Af Amer 73 >59 mL/min/1.73   GFR calc Af Amer 85 >59 mL/min/1.73   BUN/Creatinine Ratio 22 10 - 24   Sodium 139 134 - 144 mmol/L   Potassium 4.9 3.5 - 5.2 mmol/L   Chloride 101 96 - 106 mmol/L   CO2 20 18 - 29 mmol/L   Calcium 9.5 8.6 - 10.2 mg/dL      Assessment & Plan:   Problem List Items Addressed This Visit      Cardiovascular and Mediastinum   Essential hypertension    The current medical regimen is effective;  continue present plan and medications.       Relevant Medications   atorvastatin  (LIPITOR) 20 MG tablet   benazepril (LOTENSIN) 40 MG tablet   Other Relevant Orders   CBC with Differential/Platelet   Comprehensive metabolic panel   Urinalysis, Routine w reflex microscopic   Hemoglobin A1c     Endocrine   Diabetes mellitus without complication ([place])    Discussed diabetes poor control with especially weight gain. Patient wants to be given 3 months to lose weight and regained control of diabetes. Patient will also start walking exercising.      Relevant Medications   atorvastatin (LIPITOR) 20 MG tablet   benazepril (LOTENSIN) 40 MG tablet   canagliflozin (INVOKANA) 300 MG TABS tablet   metFORMIN (GLUCOPHAGE) 500 MG tablet   sitaGLIPtin (JANUVIA) 100 MG tablet   Other Relevant Orders   CBC with Differential/Platelet   Comprehensive metabolic panel   Urinalysis, Routine w reflex microscopic   Hemoglobin A1c     Genitourinary   BPH (benign prostatic hyperplasia)   Relevant Orders   PSA     Other   Hyperlipidemia    The current medical regimen is effective;  continue present plan and medications.       Relevant Medications   atorvastatin (LIPITOR) 20 MG tablet   benazepril (LOTENSIN) 40 MG tablet   Other Relevant Orders   CBC with Differential/Platelet   Comprehensive metabolic panel   Lipid panel   Urinalysis, Routine w reflex microscopic   Hemoglobin A1c    Other Visit Diagnoses    Annual physical exam    -  Primary   Relevant Orders   CBC with Differential/Platelet   Comprehensive metabolic panel   Lipid panel   TSH   PSA   Urinalysis, Routine w reflex microscopic   Hemoglobin A1c   Thyroid disorder screen       Relevant Orders   TSH   Prostate cancer screening       Relevant Orders   PSA       Follow up plan: Return in about 3 months (around 08/23/2016) for Hemoglobin A1c.

## 2016-05-23 NOTE — Assessment & Plan Note (Signed)
The current medical regimen is effective;  continue present plan and medications.  

## 2016-05-23 NOTE — Assessment & Plan Note (Signed)
Discussed diabetes poor control with especially weight gain. Patient wants to be given 3 months to lose weight and regained control of diabetes. Patient will also start walking exercising.

## 2016-05-24 LAB — LIPID PANEL
Chol/HDL Ratio: 3.1 ratio units (ref 0.0–5.0)
Cholesterol, Total: 138 mg/dL (ref 100–199)
HDL: 44 mg/dL (ref >39)
LDL Calculated: 72 mg/dL (ref 0–99)
Triglycerides: 110 mg/dL (ref 0–149)
VLDL Cholesterol Cal: 22 mg/dL (ref 5–40)

## 2016-05-24 LAB — CBC WITH DIFFERENTIAL/PLATELET
Basophils Absolute: 0 10*3/uL (ref 0.0–0.2)
Basos: 0 %
EOS (ABSOLUTE): 0.1 10*3/uL (ref 0.0–0.4)
EOS: 2 %
HEMATOCRIT: 44.2 % (ref 37.5–51.0)
Hemoglobin: 15.1 g/dL (ref 13.0–17.7)
IMMATURE GRANS (ABS): 0 10*3/uL (ref 0.0–0.1)
IMMATURE GRANULOCYTES: 0 %
Lymphocytes Absolute: 2.1 10*3/uL (ref 0.7–3.1)
Lymphs: 30 %
MCH: 29.4 pg (ref 26.6–33.0)
MCHC: 34.2 g/dL (ref 31.5–35.7)
MCV: 86 fL (ref 79–97)
MONOS ABS: 0.5 10*3/uL (ref 0.1–0.9)
Monocytes: 8 %
NEUTROS ABS: 4.1 10*3/uL (ref 1.4–7.0)
NEUTROS PCT: 60 %
Platelets: 243 10*3/uL (ref 150–379)
RBC: 5.13 x10E6/uL (ref 4.14–5.80)
RDW: 14.1 % (ref 12.3–15.4)
WBC: 6.9 10*3/uL (ref 3.4–10.8)

## 2016-05-24 LAB — COMPREHENSIVE METABOLIC PANEL
A/G RATIO: 1.7 (ref 1.2–2.2)
ALT: 42 IU/L (ref 0–44)
AST: 25 IU/L (ref 0–40)
Albumin: 4.3 g/dL (ref 3.6–4.8)
Alkaline Phosphatase: 58 IU/L (ref 39–117)
BILIRUBIN TOTAL: 0.4 mg/dL (ref 0.0–1.2)
BUN/Creatinine Ratio: 17 (ref 10–24)
BUN: 18 mg/dL (ref 8–27)
CALCIUM: 8.9 mg/dL (ref 8.6–10.2)
CHLORIDE: 98 mmol/L (ref 96–106)
CO2: 21 mmol/L (ref 18–29)
Creatinine, Ser: 1.09 mg/dL (ref 0.76–1.27)
GFR, EST AFRICAN AMERICAN: 80 mL/min/{1.73_m2} (ref >59)
GFR, EST NON AFRICAN AMERICAN: 69 mL/min/{1.73_m2} (ref >59)
GLOBULIN, TOTAL: 2.5 g/dL (ref 1.5–4.5)
Glucose: 123 mg/dL — ABNORMAL HIGH (ref 65–99)
POTASSIUM: 4.3 mmol/L (ref 3.5–5.2)
SODIUM: 136 mmol/L (ref 134–144)
TOTAL PROTEIN: 6.8 g/dL (ref 6.0–8.5)

## 2016-05-24 LAB — PSA: PROSTATE SPECIFIC AG, SERUM: 0.8 ng/mL (ref 0.0–4.0)

## 2016-05-24 LAB — TSH: TSH: 1.46 u[IU]/mL (ref 0.450–4.500)

## 2016-05-25 ENCOUNTER — Encounter: Payer: Self-pay | Admitting: Family Medicine

## 2016-08-24 ENCOUNTER — Ambulatory Visit: Payer: Medicare HMO | Admitting: Family Medicine

## 2016-08-25 DIAGNOSIS — Z7982 Long term (current) use of aspirin: Secondary | ICD-10-CM | POA: Diagnosis not present

## 2016-08-25 DIAGNOSIS — Z0000 Encounter for general adult medical examination without abnormal findings: Secondary | ICD-10-CM | POA: Diagnosis not present

## 2016-08-25 DIAGNOSIS — Z973 Presence of spectacles and contact lenses: Secondary | ICD-10-CM | POA: Diagnosis not present

## 2016-08-25 DIAGNOSIS — I10 Essential (primary) hypertension: Secondary | ICD-10-CM | POA: Diagnosis not present

## 2016-08-25 DIAGNOSIS — E119 Type 2 diabetes mellitus without complications: Secondary | ICD-10-CM | POA: Diagnosis not present

## 2016-08-25 DIAGNOSIS — Z7984 Long term (current) use of oral hypoglycemic drugs: Secondary | ICD-10-CM | POA: Diagnosis not present

## 2016-08-25 DIAGNOSIS — E785 Hyperlipidemia, unspecified: Secondary | ICD-10-CM | POA: Diagnosis not present

## 2016-08-25 DIAGNOSIS — E669 Obesity, unspecified: Secondary | ICD-10-CM | POA: Diagnosis not present

## 2016-08-25 DIAGNOSIS — Z6832 Body mass index (BMI) 32.0-32.9, adult: Secondary | ICD-10-CM | POA: Diagnosis not present

## 2016-11-21 ENCOUNTER — Ambulatory Visit: Payer: Medicare HMO | Admitting: Family Medicine

## 2016-12-15 DIAGNOSIS — Z23 Encounter for immunization: Secondary | ICD-10-CM | POA: Diagnosis not present

## 2016-12-24 ENCOUNTER — Other Ambulatory Visit: Payer: Self-pay | Admitting: Family Medicine

## 2016-12-24 DIAGNOSIS — E119 Type 2 diabetes mellitus without complications: Secondary | ICD-10-CM

## 2016-12-26 ENCOUNTER — Ambulatory Visit: Payer: Medicare HMO | Admitting: Family Medicine

## 2016-12-26 DIAGNOSIS — R69 Illness, unspecified: Secondary | ICD-10-CM | POA: Diagnosis not present

## 2017-05-17 ENCOUNTER — Ambulatory Visit: Payer: Self-pay

## 2017-05-17 ENCOUNTER — Ambulatory Visit: Payer: Self-pay | Admitting: Family Medicine

## 2017-07-18 ENCOUNTER — Ambulatory Visit: Payer: Self-pay

## 2017-07-31 ENCOUNTER — Ambulatory Visit: Payer: Self-pay | Admitting: Family Medicine

## 2017-08-08 ENCOUNTER — Ambulatory Visit (INDEPENDENT_AMBULATORY_CARE_PROVIDER_SITE_OTHER): Payer: Medicare HMO | Admitting: Family Medicine

## 2017-08-08 ENCOUNTER — Ambulatory Visit (INDEPENDENT_AMBULATORY_CARE_PROVIDER_SITE_OTHER): Payer: Medicare HMO

## 2017-08-08 ENCOUNTER — Encounter: Payer: Self-pay | Admitting: Family Medicine

## 2017-08-08 VITALS — BP 118/60 | HR 94 | Temp 97.9°F | Ht 70.0 in | Wt 211.0 lb

## 2017-08-08 VITALS — BP 118/60 | HR 94 | Temp 97.9°F | Resp 17 | Ht 70.0 in | Wt 211.0 lb

## 2017-08-08 DIAGNOSIS — E785 Hyperlipidemia, unspecified: Secondary | ICD-10-CM | POA: Diagnosis not present

## 2017-08-08 DIAGNOSIS — E7800 Pure hypercholesterolemia, unspecified: Secondary | ICD-10-CM | POA: Diagnosis not present

## 2017-08-08 DIAGNOSIS — Z0000 Encounter for general adult medical examination without abnormal findings: Secondary | ICD-10-CM | POA: Diagnosis not present

## 2017-08-08 DIAGNOSIS — E119 Type 2 diabetes mellitus without complications: Secondary | ICD-10-CM | POA: Diagnosis not present

## 2017-08-08 DIAGNOSIS — Z1159 Encounter for screening for other viral diseases: Secondary | ICD-10-CM | POA: Diagnosis not present

## 2017-08-08 DIAGNOSIS — Z23 Encounter for immunization: Secondary | ICD-10-CM

## 2017-08-08 DIAGNOSIS — I10 Essential (primary) hypertension: Secondary | ICD-10-CM | POA: Diagnosis not present

## 2017-08-08 LAB — BAYER DCA HB A1C WAIVED: HB A1C: 6.8 % (ref <7.0)

## 2017-08-08 MED ORDER — ATORVASTATIN CALCIUM 20 MG PO TABS: 20.0000 mg | ORAL_TABLET | Freq: Every day | ORAL | 2 refills | Status: DC

## 2017-08-08 MED ORDER — BENAZEPRIL HCL 40 MG PO TABS: 40.0000 mg | ORAL_TABLET | Freq: Every day | ORAL | 2 refills | Status: DC

## 2017-08-08 MED ORDER — METFORMIN HCL 500 MG PO TABS: 1000.0000 mg | ORAL_TABLET | Freq: Two times a day (BID) | ORAL | 2 refills | Status: DC

## 2017-08-08 NOTE — Assessment & Plan Note (Signed)
The current medical regimen is effective;  continue present plan and medications.  

## 2017-08-08 NOTE — Progress Notes (Signed)
Subjective:   Matthew [name] is a 72 y.o. male who presents for Medicare Annual/Subsequent preventive examination.  Review of Systems:  Cardiac Risk Factors include: diabetes mellitus;advanced age (>89men, >8 women);hypertension;dyslipidemia;obesity (BMI >30kg/m2)     Objective:    Vitals: BP 118/60 (BP Location: Left Arm, Patient Position: Sitting)   Pulse 94   Temp 97.9 F (36.6 C) (Temporal)   Resp 17   Ht 5\' 10"  (1.778 m)   Wt 211 lb (95.7 kg)   SpO2 98%   BMI 30.28 kg/m   Body mass index is 30.28 kg/m.  No flowsheet data found.  Tobacco Social History   Tobacco Use  Smoking Status Never Smoker  Smokeless Tobacco Never Used     Counseling given: Not Answered   Clinical Intake:  Pre-visit preparation completed: Yes  Pain : No/denies pain     Nutritional Status: BMI > 30  Obese Nutritional Risks: None Diabetes: Yes CBG done?: No Did pt. bring in CBG monitor from home?: No  How often do you need to have someone help you when you read instructions, pamphlets, or other written materials from your doctor or pharmacy?: 1 - Never What is the last grade level you completed in school?: college   Interpreter Needed?: No  Information entered by :: Keziah Avis,LPN  Past Medical History:  Diagnosis Date  . Asthma   . Diabetes mellitus without complication ([place])   . Hyperlipidemia   . Sleep apnea    Past Surgical History:  Procedure Laterality Date  . HERNIA REPAIR     Family History  Problem Relation Age of Onset  . Diabetes Mother   . Hypertension Father   . Diabetes Sister   . Diabetes Brother   . Diabetes Maternal Grandmother    Social History   Socioeconomic History  . Marital status: Married    Spouse name: Not on file  . Number of children: Not on file  . Years of education: Not on file  . Highest education level: Not on file  Occupational History  . Not on file  Social Needs  . Financial resource strain: Not hard at all  . Food  insecurity:    Worry: Never true    Inability: Never true  . Transportation needs:    Medical: No    Non-medical: No  Tobacco Use  . Smoking status: Never Smoker  . Smokeless tobacco: Never Used  Substance and Sexual Activity  . Alcohol use: No    Comment: rare  . Drug use: No  . Sexual activity: Not on file  Lifestyle  . Physical activity:    Days per week: 6 days    Minutes per session: 20 min  . Stress: Not at all  Relationships  . Social connections:    Talks on phone: More than three times a week    Gets together: More than three times a week    Attends religious service: More than 4 times per year    Active member of club or organization: Yes    Attends meetings of clubs or organizations: More than 4 times per year    Relationship status: Married  Other Topics Concern  . 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 aspirin EC 325 MG tablet Take 325 mg by mouth daily.  Marland Kitchen atorvastatin (LIPITOR) 20 MG  tablet Take 1 tablet (20 mg total) by mouth daily.  . benazepril (LOTENSIN) 40 MG tablet Take 1 tablet (40 mg total) by mouth daily.  . metFORMIN (GLUCOPHAGE) 500 MG tablet Take 2 tablets (1,000 mg total) by mouth 2 (two) times daily with a meal.  . Multiple Vitamins-Minerals (CENTRUM SILVER 50+MEN) TABS Take by mouth.  . canagliflozin (INVOKANA) 300 MG TABS tablet Take 1 tablet (300 mg total) by mouth daily before breakfast. (Patient not taking: Reported on 08/08/2017)  . JANUVIA 100 MG tablet Take 1 tablet (100 mg total) by mouth daily. (Patient not taking: Reported on 08/08/2017)   No facility-administered encounter medications on file as of 08/08/2017.     Activities of Daily Living In your present state of health, do you have any difficulty performing the following activities: 08/08/2017  Hearing? N  Vision? N  Difficulty concentrating  or making decisions? N  Walking or climbing stairs? N  Dressing or bathing? N  Doing errands, shopping? N  Preparing Food and eating ? N  Using the Toilet? N  In the past six months, have you accidently leaked urine? N  Do you have problems with loss of bowel control? N  Managing your Medications? N  Managing your Finances? N  Housekeeping or managing your Housekeeping? N  Some recent data might be hidden    Patient Care Team: Guadalupe Maple, MD as PCP - General (Family Medicine)   Assessment:   This is a routine wellness examination for Matthew [name].  Exercise Activities and Dietary recommendations Current Exercise Habits: Home exercise routine, Time (Minutes): 40, Frequency (Times/Week): 6(twice a day for 20 min each ), Weekly Exercise (Minutes/Week): 240, Intensity: Mild, Exercise limited by: None identified  Goals    . DIET - INCREASE WATER INTAKE     Recommend drinking at least 6-8 glasses of water a day        Fall Risk Fall Risk  08/08/2017 05/23/2016 02/19/2015 12/30/2014  Falls in the past year? No No No No   Is the patient's home free of loose throw rugs in walkways, pet beds, electrical cords, etc?   yes      Grab bars in the bathroom? no      Handrails on the stairs?   yes      Adequate lighting?   yes  Timed Get Up and Go Performed: Completed in 8 seconds with no use of assistive devices, steady gait. No intervention needed at this time.   Depression Screen PHQ 2/9 Scores 08/08/2017 05/23/2016 12/30/2014  PHQ - 2 Score 0 0 0    Cognitive Function     6CIT Screen 08/08/2017  What Year? 0 points  What month? 0 points  What time? 0 points  Count back from 20 0 points  Months in reverse 0 points  Repeat phrase 0 points  Total Score 0    Immunization History  Administered Date(s) Administered  . Influenza, High Dose Seasonal PF 12/29/2013, 11/15/2015  . Influenza,inj,Quad PF,6+ Mos 12/30/2014  . Influenza-Unspecified 12/25/2016  . Pneumococcal Conjugate-13  12/30/2014  . Pneumococcal-Unspecified 06/16/2011  . Tdap 03/15/2011  . Zoster 06/16/2011    Pneumovax 23 due, postponed due to not having it at pcp office today.  Qualifies for Shingles Vaccine? Yes, discussed shingrix vaccine   Screening Tests Health Maintenance  Topic Date Due  . Hepatitis C Screening  1945-08-21  . OPHTHALMOLOGY EXAM  12/02/2015  . PNA vac Low Risk Adult (2 of 2 - PPSV23) 06/15/2016  .  HEMOGLOBIN A1C  11/23/2016  . FOOT EXAM  05/23/2017  . INFLUENZA VACCINE  09/27/2017  . TETANUS/TDAP  03/14/2021  . COLONOSCOPY  06/28/2021   Cancer Screenings: Lung: Low Dose CT Chest recommended if Age 70-80 years, 30 pack-year currently smoking OR have quit w/in 15years. Patient does not qualify. Colorectal: completed 06/29/2011  Additional Screenings:  Hepatitis C Screening: due now, ordered      Plan:    I have personally reviewed and addressed the Medicare Annual Wellness questionnaire and have noted the following in the patient's chart:  A. Medical and social history B. Use of alcohol, tobacco or illicit drugs  C. Current medications and supplements D. Functional ability and status E.  Nutritional status F.  Physical activity G. Advance directives H. List of other physicians I.  Hospitalizations, surgeries, and ER visits in previous 12 months J.  Corder such as hearing and vision if needed, cognitive and depression L. Referrals and appointments   In addition, I have reviewed and discussed with patient certain preventive protocols, quality metrics, and best practice recommendations. A written personalized care plan for preventive services as well as general preventive health recommendations were provided to patient.   Signed,  Tyler Aas, LPN Nurse Health Advisor   Nurse Notes:none

## 2017-08-08 NOTE — Progress Notes (Signed)
BP 118/60   Pulse 94   Temp 97.9 F (36.6 C) (Oral)   Ht 5\' 10"  (1.778 m)   Wt 211 lb (95.7 kg)   SpO2 98%   BMI 30.28 kg/m    Subjective:    Patient ID: Matthew [name], male    DOB: [birth year], 72 y.o.   MRN: [id number]  HPI: Matthew [name] is a 72 y.o. male  Patient with diabetes concerns stopped all medication except for metformin.  This was in response to a change in his insurance which did not pay for any tier 3 medications in particular Invokana and Januvia. Patient is a consequence is decided to get serious about self-care and is lost 38 pounds. Patient still taking benazepril 40 mg 1 a day and atorvastatin 20 mg 1 a day taking these medications without problems or side effects and good control.  Relevant past medical, surgical, family and social history reviewed and updated as indicated. Interim medical history since our last visit reviewed. Allergies and medications reviewed and updated.  Review of Systems  Constitutional: Negative.   Respiratory: Negative.   Cardiovascular: Negative.     Per HPI unless specifically indicated above     Objective:    BP 118/60   Pulse 94   Temp 97.9 F (36.6 C) (Oral)   Ht 5\' 10"  (1.778 m)   Wt 211 lb (95.7 kg)   SpO2 98%   BMI 30.28 kg/m   Wt Readings from Last 3 Encounters:  08/08/17 211 lb (95.7 kg)  08/08/17 211 lb (95.7 kg)  05/23/16 249 lb 1.6 oz (113 kg)    Physical Exam  Constitutional: He is oriented to person, place, and time. He appears well-developed and well-nourished.  HENT:  Head: Normocephalic and atraumatic.  Eyes: Conjunctivae and EOM are normal.  Neck: Normal range of motion.  Cardiovascular: Normal rate, regular rhythm and normal heart sounds.  Pulmonary/Chest: Effort normal and breath sounds normal.  Musculoskeletal: Normal range of motion.  Neurological: He is alert and oriented to person, place, and time.  Skin: No erythema.  Psychiatric: He has a normal mood and affect. His behavior is  normal. Judgment and thought content normal.    Results for orders placed or performed in visit on 05/23/16  Microscopic Examination  Result Value Ref Range   WBC, UA None seen 0 - 5 /hpf   RBC, UA None seen 0 - 2 /hpf   Epithelial Cells (non renal) CANCELED    Bacteria, UA None seen None seen/Few  CBC with Differential/Platelet  Result Value Ref Range   WBC 6.9 3.4 - 10.8 x10E3/uL   RBC 5.13 4.14 - 5.80 x10E6/uL   Hemoglobin 15.1 13.0 - 17.7 g/dL   Hematocrit 44.2 37.5 - 51.0 %   MCV 86 79 - 97 fL   MCH 29.4 26.6 - 33.0 pg   MCHC 34.2 31.5 - 35.7 g/dL   RDW 14.1 12.3 - 15.4 %   Platelets 243 150 - 379 x10E3/uL   Neutrophils 60 Not Estab. %   Lymphs 30 Not Estab. %   Monocytes 8 Not Estab. %   Eos 2 Not Estab. %   Basos 0 Not Estab. %   Neutrophils Absolute 4.1 1.4 - 7.0 x10E3/uL   Lymphocytes Absolute 2.1 0.7 - 3.1 x10E3/uL   Monocytes Absolute 0.5 0.1 - 0.9 x10E3/uL   EOS (ABSOLUTE) 0.1 0.0 - 0.4 x10E3/uL   Basophils Absolute 0.0 0.0 - 0.2 x10E3/uL   Immature Granulocytes 0 Not  Estab. %   Immature Grans (Abs) 0.0 0.0 - 0.1 x10E3/uL  Comprehensive metabolic panel  Result Value Ref Range   Glucose 123 (H) 65 - 99 mg/dL   BUN 18 8 - 27 mg/dL   Creatinine, Ser 1.09 0.76 - 1.27 mg/dL   GFR calc non Af Amer 69 >59 mL/min/1.73   GFR calc Af Amer 80 >59 mL/min/1.73   BUN/Creatinine Ratio 17 10 - 24   Sodium 136 134 - 144 mmol/L   Potassium 4.3 3.5 - 5.2 mmol/L   Chloride 98 96 - 106 mmol/L   CO2 21 18 - 29 mmol/L   Calcium 8.9 8.6 - 10.2 mg/dL   Total Protein 6.8 6.0 - 8.5 g/dL   Albumin 4.3 3.6 - 4.8 g/dL   Globulin, Total 2.5 1.5 - 4.5 g/dL   Albumin/Globulin Ratio 1.7 1.2 - 2.2   Bilirubin Total 0.4 0.0 - 1.2 mg/dL   Alkaline Phosphatase 58 39 - 117 IU/L   AST 25 0 - 40 IU/L   ALT 42 0 - 44 IU/L  Lipid panel  Result Value Ref Range   Cholesterol, Total 138 100 - 199 mg/dL   Triglycerides 110 0 - 149 mg/dL   HDL 44 >39 mg/dL   VLDL Cholesterol Cal 22 5 - 40  mg/dL   LDL Calculated 72 0 - 99 mg/dL   Chol/HDL Ratio 3.1 0.0 - 5.0 ratio units  TSH  Result Value Ref Range   TSH 1.460 0.450 - 4.500 uIU/mL  PSA  Result Value Ref Range   Prostate Specific Ag, Serum 0.8 0.0 - 4.0 ng/mL  Urinalysis, Routine w reflex microscopic  Result Value Ref Range   Specific Gravity, UA 1.015 1.005 - 1.030   pH, UA 5.5 5.0 - 7.5   Color, UA Yellow Yellow   Appearance Ur Clear Clear   Leukocytes, UA Negative Negative   Protein, UA Negative Negative/Trace   Glucose, UA 3+ (A) Negative   Ketones, UA Negative Negative   RBC, UA Negative Negative   Bilirubin, UA Negative Negative   Urobilinogen, Ur 0.2 0.2 - 1.0 mg/dL   Nitrite, UA Negative Negative   Microscopic Examination See below:   Hemoglobin A1c  Result Value Ref Range   Hgb A1c MFr Bld 8.4 (H) 4.8 - 5.6 %   Est. average glucose Bld gHb Est-mCnc 194 mg/dL      Assessment & Plan:   Problem List Items Addressed This Visit      Cardiovascular and Mediastinum   Essential hypertension    The current medical regimen is effective;  continue present plan and medications.       Relevant Medications   benazepril (LOTENSIN) 40 MG tablet   atorvastatin (LIPITOR) 20 MG tablet     Endocrine   Diabetes mellitus without complication ([facility]) - Primary   Relevant Medications   metFORMIN (GLUCOPHAGE) 500 MG tablet   benazepril (LOTENSIN) 40 MG tablet   atorvastatin (LIPITOR) 20 MG tablet   Other Relevant Orders   Bayer DCA Hb A1c Waived     Other   Hyperlipidemia    The current medical regimen is effective;  continue present plan and medications.       Relevant Medications   benazepril (LOTENSIN) 40 MG tablet   atorvastatin (LIPITOR) 20 MG tablet       Follow up plan: Return in about 3 months (around 11/08/2017) for Physical Exam, Hemoglobin A1c.

## 2017-08-08 NOTE — Patient Instructions (Addendum)
Matthew [name] , Thank you for taking time to come for your Medicare Wellness Visit. I appreciate your ongoing commitment to your health goals. Please review the following plan we discussed and let me know if I can assist you in the future.   Screening recommendations/referrals: Colonoscopy: completed 06/29/2011 Recommended yearly ophthalmology/optometry visit for glaucoma screening and checkup Recommended yearly dental visit for hygiene and checkup  Vaccinations: Influenza vaccine: due 10/2017 Pneumococcal vaccine: pneumovax 23 due, postponed  Tdap vaccine: up to date Shingles vaccine: shingrix eligible, check with your insurance company for coverage information.   Advanced directives: Please bring a copy of your health care power of attorney and living will to the office at your convenience.  Conditions/risks identified: Recommend continue drinking at least 6-8 glasses of water a day   Next appointment: Follow up in one year for your annual wellness exam.   Preventive Care 65 Years and Older, Male Preventive care refers to lifestyle choices and visits with your health care provider that can promote health and wellness. What does preventive care include?  A yearly physical exam. This is also called an annual well check.  Dental exams once or twice a year.  Routine eye exams. Ask your health care provider how often you should have your eyes checked.  Personal lifestyle choices, including:  Daily care of your teeth and gums.  Regular physical activity.  Eating a healthy diet.  Avoiding tobacco and drug use.  Limiting alcohol use.  Practicing safe sex.  Taking low doses of aspirin every day.  Taking vitamin and mineral supplements as recommended by your health care provider. What happens during an annual well check? The services and screenings done by your health care provider during your annual well check will depend on your age, overall health, lifestyle risk factors, and  family history of disease. Counseling  Your health care provider may ask you questions about your:  Alcohol use.  Tobacco use.  Drug use.  Emotional well-being.  Home and relationship well-being.  Sexual activity.  Eating habits.  History of falls.  Memory and ability to understand (cognition).  Work and work Statistician. Screening  You may have the following tests or measurements:  Height, weight, and BMI.  Blood pressure.  Lipid and cholesterol levels. These may be checked every 5 years, or more frequently if you are over 63 years old.  Skin check.  Lung cancer screening. You may have this screening every year starting at age 34 if you have a 30-pack-year history of smoking and currently smoke or have quit within the past 15 years.  Fecal occult blood test (FOBT) of the stool. You may have this test every year starting at age 4.  Flexible sigmoidoscopy or colonoscopy. You may have a sigmoidoscopy every 5 years or a colonoscopy every 10 years starting at age 68.  Prostate cancer screening. Recommendations will vary depending on your family history and other risks.  Hepatitis C blood test.  Hepatitis B blood test.  Sexually transmitted disease (STD) testing.  Diabetes screening. This is done by checking your blood sugar (glucose) after you have not eaten for a while (fasting). You may have this done every 1-3 years.  Abdominal aortic aneurysm (AAA) screening. You may need this if you are a current or former smoker.  Osteoporosis. You may be screened starting at age 5 if you are at high risk. Talk with your health care provider about your test results, treatment options, and if necessary, the need for more tests.  Vaccines  Your health care provider may recommend certain vaccines, such as:  Influenza vaccine. This is recommended every year.  Tetanus, diphtheria, and acellular pertussis (Tdap, Td) vaccine. You may need a Td booster every 10 years.  Zoster  vaccine. You may need this after age 24.  Pneumococcal 13-valent conjugate (PCV13) vaccine. One dose is recommended after age 47.  Pneumococcal polysaccharide (PPSV23) vaccine. One dose is recommended after age 37. Talk to your health care provider about which screenings and vaccines you need and how often you need them. This information is not intended to replace advice given to you by your health care provider. Make sure you discuss any questions you have with your health care provider. Document Released: 03/12/2015 Document Revised: 11/03/2015 Document Reviewed: 12/15/2014 Elsevier Interactive Patient Education  2017 [place] Prevention in the Home Falls can cause injuries. They can happen to people of all ages. There are many things you can do to make your home safe and to help prevent falls. What can I do on the outside of my home?  Regularly fix the edges of walkways and driveways and fix any cracks.  Remove anything that might make you trip as you walk through a door, such as a raised step or threshold.  Trim any bushes or trees on the path to your home.  Use bright outdoor lighting.  Clear any walking paths of anything that might make someone trip, such as rocks or tools.  Regularly check to see if handrails are loose or broken. Make sure that both sides of any steps have handrails.  Any raised decks and porches should have guardrails on the edges.  Have any leaves, snow, or ice cleared regularly.  Use sand or salt on walking paths during winter.  Clean up any spills in your garage right away. This includes oil or grease spills. What can I do in the bathroom?  Use night lights.  Install grab bars by the toilet and in the tub and shower. Do not use towel bars as grab bars.  Use non-skid mats or decals in the tub or shower.  If you need to sit down in the shower, use a plastic, non-slip stool.  Keep the floor dry. Clean up any water that spills on the  floor as soon as it happens.  Remove soap buildup in the tub or shower regularly.  Attach bath mats securely with double-sided non-slip rug tape.  Do not have throw rugs and other things on the floor that can make you trip. What can I do in the bedroom?  Use night lights.  Make sure that you have a light by your bed that is easy to reach.  Do not use any sheets or blankets that are too big for your bed. They should not hang down onto the floor.  Have a firm chair that has side arms. You can use this for support while you get dressed.  Do not have throw rugs and other things on the floor that can make you trip. What can I do in the kitchen?  Clean up any spills right away.  Avoid walking on wet floors.  Keep items that you use a lot in easy-to-reach places.  If you need to reach something above you, use a strong step stool that has a grab bar.  Keep electrical cords out of the way.  Do not use floor polish or wax that makes floors slippery. If you must use wax, use non-skid floor wax.  Do not have throw rugs and other things on the floor that can make you trip. What can I do with my stairs?  Do not leave any items on the stairs.  Make sure that there are handrails on both sides of the stairs and use them. Fix handrails that are broken or loose. Make sure that handrails are as long as the stairways.  Check any carpeting to make sure that it is firmly attached to the stairs. Fix any carpet that is loose or worn.  Avoid having throw rugs at the top or bottom of the stairs. If you do have throw rugs, attach them to the floor with carpet tape.  Make sure that you have a light switch at the top of the stairs and the bottom of the stairs. If you do not have them, ask someone to add them for you. What else can I do to help prevent falls?  Wear shoes that:  Do not have high heels.  Have rubber bottoms.  Are comfortable and fit you well.  Are closed at the toe. Do not wear  sandals.  If you use a stepladder:  Make sure that it is fully opened. Do not climb a closed stepladder.  Make sure that both sides of the stepladder are locked into place.  Ask someone to hold it for you, if possible.  Clearly mark and make sure that you can see:  Any grab bars or handrails.  First and last steps.  Where the edge of each step is.  Use tools that help you move around (mobility aids) if they are needed. These include:  Canes.  Walkers.  Scooters.  Crutches.  Turn on the lights when you go into a dark area. Replace any light bulbs as soon as they burn out.  Set up your furniture so you have a clear path. Avoid moving your furniture around.  If any of your floors are uneven, fix them.  If there are any pets around you, be aware of where they are.  Review your medicines with your doctor. Some medicines can make you feel dizzy. This can increase your chance of falling. Ask your doctor what other things that you can do to help prevent falls. This information is not intended to replace advice given to you by your health care provider. Make sure you discuss any questions you have with your health care provider. Document Released: 12/10/2008 Document Revised: 07/22/2015 Document Reviewed: 03/20/2014 Elsevier Interactive Patient Education  2017 [employer].

## 2017-08-09 LAB — HEPATITIS C ANTIBODY: Hep C Virus Ab: <0.1 s/co ratio (ref 0.0–0.9)

## 2017-11-08 DIAGNOSIS — M545 Low back pain: Secondary | ICD-10-CM | POA: Diagnosis not present

## 2017-11-29 ENCOUNTER — Encounter: Payer: Medicare HMO | Admitting: Family Medicine

## 2017-12-28 DIAGNOSIS — R69 Illness, unspecified: Secondary | ICD-10-CM | POA: Diagnosis not present

## 2018-03-04 ENCOUNTER — Encounter: Payer: Medicare HMO | Admitting: Family Medicine

## 2018-03-14 ENCOUNTER — Encounter: Payer: Medicare HMO | Admitting: Family Medicine

## 2018-03-26 DIAGNOSIS — L820 Inflamed seborrheic keratosis: Secondary | ICD-10-CM | POA: Diagnosis not present

## 2018-05-23 ENCOUNTER — Encounter: Payer: Medicare HMO | Admitting: Family Medicine

## 2018-06-18 ENCOUNTER — Other Ambulatory Visit: Payer: Self-pay | Admitting: Family Medicine

## 2018-06-18 DIAGNOSIS — I10 Essential (primary) hypertension: Secondary | ICD-10-CM

## 2018-06-18 DIAGNOSIS — E7800 Pure hypercholesterolemia, unspecified: Secondary | ICD-10-CM

## 2018-06-18 DIAGNOSIS — E119 Type 2 diabetes mellitus without complications: Secondary | ICD-10-CM

## 2018-06-18 NOTE — Telephone Encounter (Signed)
Courtesy refill  

## 2018-08-12 ENCOUNTER — Ambulatory Visit (INDEPENDENT_AMBULATORY_CARE_PROVIDER_SITE_OTHER): Payer: Medicare HMO

## 2018-08-12 VITALS — Ht 70.0 in | Wt 224.0 lb

## 2018-08-12 DIAGNOSIS — Z0000 Encounter for general adult medical examination without abnormal findings: Secondary | ICD-10-CM

## 2018-08-12 NOTE — Patient Instructions (Signed)
Matthew [name] , Thank you for taking time to come for your Medicare Wellness Visit. I appreciate your ongoing commitment to your health goals. Please review the following plan we discussed and let me know if I can assist you in the future.   Screening recommendations/referrals: Colonoscopy: completed 06/29/2011, due 2023 Recommended yearly ophthalmology/optometry visit for glaucoma screening and checkup Recommended yearly dental visit for hygiene and checkup  Vaccinations: Influenza vaccine: up to date Pneumococcal vaccine: pneumovax 23 due, will receieve next in office visit Tdap vaccine: up to date Shingles vaccine: shingrix eligible,check with your insurance company for coverage    Advanced directives: Please bring a copy of your health care power of attorney and living will to the office at your convenience.  Conditions/risks identified: diabetic  Next appointment: Follow up in one year for your annual wellness exam.   Preventive Care 65 Years and Older, Male Preventive care refers to lifestyle choices and visits with your health care provider that can promote health and wellness. What does preventive care include?  A yearly physical exam. This is also called an annual well check.  Dental exams once or twice a year.  Routine eye exams. Ask your health care provider how often you should have your eyes checked.  Personal lifestyle choices, including:  Daily care of your teeth and gums.  Regular physical activity.  Eating a healthy diet.  Avoiding tobacco and drug use.  Limiting alcohol use.  Practicing safe sex.  Taking low doses of aspirin every day.  Taking vitamin and mineral supplements as recommended by your health care provider. What happens during an annual well check? The services and screenings done by your health care provider during your annual well check will depend on your age, overall health, lifestyle risk factors, and family history of disease. Counseling   Your health care provider may ask you questions about your:  Alcohol use.  Tobacco use.  Drug use.  Emotional well-being.  Home and relationship well-being.  Sexual activity.  Eating habits.  History of falls.  Memory and ability to understand (cognition).  Work and work Statistician. Screening  You may have the following tests or measurements:  Height, weight, and BMI.  Blood pressure.  Lipid and cholesterol levels. These may be checked every 5 years, or more frequently if you are over 88 years old.  Skin check.  Lung cancer screening. You may have this screening every year starting at age 53 if you have a 30-pack-year history of smoking and currently smoke or have quit within the past 15 years.  Fecal occult blood test (FOBT) of the stool. You may have this test every year starting at age 24.  Flexible sigmoidoscopy or colonoscopy. You may have a sigmoidoscopy every 5 years or a colonoscopy every 10 years starting at age 83.  Prostate cancer screening. Recommendations will vary depending on your family history and other risks.  Hepatitis C blood test.  Hepatitis B blood test.  Sexually transmitted disease (STD) testing.  Diabetes screening. This is done by checking your blood sugar (glucose) after you have not eaten for a while (fasting). You may have this done every 1-3 years.  Abdominal aortic aneurysm (AAA) screening. You may need this if you are a current or former smoker.  Osteoporosis. You may be screened starting at age 18 if you are at high risk. Talk with your health care provider about your test results, treatment options, and if necessary, the need for more tests. Vaccines  Your health care  provider may recommend certain vaccines, such as:  Influenza vaccine. This is recommended every year.  Tetanus, diphtheria, and acellular pertussis (Tdap, Td) vaccine. You may need a Td booster every 10 years.  Zoster vaccine. You may need this after age 80.   Pneumococcal 13-valent conjugate (PCV13) vaccine. One dose is recommended after age 41.  Pneumococcal polysaccharide (PPSV23) vaccine. One dose is recommended after age 60. Talk to your health care provider about which screenings and vaccines you need and how often you need them. This information is not intended to replace advice given to you by your health care provider. Make sure you discuss any questions you have with your health care provider. Document Released: 03/12/2015 Document Revised: 11/03/2015 Document Reviewed: 12/15/2014 Elsevier Interactive Patient Education  2017 [place] Prevention in the Home Falls can cause injuries. They can happen to people of all ages. There are many things you can do to make your home safe and to help prevent falls. What can I do on the outside of my home?  Regularly fix the edges of walkways and driveways and fix any cracks.  Remove anything that might make you trip as you walk through a door, such as a raised step or threshold.  Trim any bushes or trees on the path to your home.  Use bright outdoor lighting.  Clear any walking paths of anything that might make someone trip, such as rocks or tools.  Regularly check to see if handrails are loose or broken. Make sure that both sides of any steps have handrails.  Any raised decks and porches should have guardrails on the edges.  Have any leaves, snow, or ice cleared regularly.  Use sand or salt on walking paths during winter.  Clean up any spills in your garage right away. This includes oil or grease spills. What can I do in the bathroom?  Use night lights.  Install grab bars by the toilet and in the tub and shower. Do not use towel bars as grab bars.  Use non-skid mats or decals in the tub or shower.  If you need to sit down in the shower, use a plastic, non-slip stool.  Keep the floor dry. Clean up any water that spills on the floor as soon as it happens.  Remove soap  buildup in the tub or shower regularly.  Attach bath mats securely with double-sided non-slip rug tape.  Do not have throw rugs and other things on the floor that can make you trip. What can I do in the bedroom?  Use night lights.  Make sure that you have a light by your bed that is easy to reach.  Do not use any sheets or blankets that are too big for your bed. They should not hang down onto the floor.  Have a firm chair that has side arms. You can use this for support while you get dressed.  Do not have throw rugs and other things on the floor that can make you trip. What can I do in the kitchen?  Clean up any spills right away.  Avoid walking on wet floors.  Keep items that you use a lot in easy-to-reach places.  If you need to reach something above you, use a strong step stool that has a grab bar.  Keep electrical cords out of the way.  Do not use floor polish or wax that makes floors slippery. If you must use wax, use non-skid floor wax.  Do not have throw  rugs and other things on the floor that can make you trip. What can I do with my stairs?  Do not leave any items on the stairs.  Make sure that there are handrails on both sides of the stairs and use them. Fix handrails that are broken or loose. Make sure that handrails are as long as the stairways.  Check any carpeting to make sure that it is firmly attached to the stairs. Fix any carpet that is loose or worn.  Avoid having throw rugs at the top or bottom of the stairs. If you do have throw rugs, attach them to the floor with carpet tape.  Make sure that you have a light switch at the top of the stairs and the bottom of the stairs. If you do not have them, ask someone to add them for you. What else can I do to help prevent falls?  Wear shoes that:  Do not have high heels.  Have rubber bottoms.  Are comfortable and fit you well.  Are closed at the toe. Do not wear sandals.  If you use a stepladder:  Make  sure that it is fully opened. Do not climb a closed stepladder.  Make sure that both sides of the stepladder are locked into place.  Ask someone to hold it for you, if possible.  Clearly mark and make sure that you can see:  Any grab bars or handrails.  First and last steps.  Where the edge of each step is.  Use tools that help you move around (mobility aids) if they are needed. These include:  Canes.  Walkers.  Scooters.  Crutches.  Turn on the lights when you go into a dark area. Replace any light bulbs as soon as they burn out.  Set up your furniture so you have a clear path. Avoid moving your furniture around.  If any of your floors are uneven, fix them.  If there are any pets around you, be aware of where they are.  Review your medicines with your doctor. Some medicines can make you feel dizzy. This can increase your chance of falling. Ask your doctor what other things that you can do to help prevent falls. This information is not intended to replace advice given to you by your health care provider. Make sure you discuss any questions you have with your health care provider. Document Released: 12/10/2008 Document Revised: 07/22/2015 Document Reviewed: 03/20/2014 Elsevier Interactive Patient Education  2017 [employer].

## 2018-08-12 NOTE — Progress Notes (Signed)
Subjective:   Matthew [name] is a 73 y.o. male who presents for Medicare Annual/Subsequent preventive examination.  This visit is being conducted via phone call  - after an attmept to do on video chat - due to the COVID-19 pandemic. This patient has given me verbal consent via phone to conduct this visit, patient states they are participating from their home address. Some vital signs may be absent or patient reported.   Patient identification: identified by name, DOB, and current address.    Review of Systems:   Cardiac Risk Factors include: advanced age (>86men, >66 women);dyslipidemia;male gender;diabetes mellitus;hypertension     Objective:    Vitals: Ht 5\' 10"  (1.778 m)   Wt 224 lb (101.6 kg) Comment: patient performed  BMI 32.14 kg/m   Body mass index is 32.14 kg/m.  Advanced Directives 08/12/2018  Does Patient Have a Medical Advance Directive? Yes  Type of Advance Directive Living will;Healthcare Power of [place] in Chart? No - copy requested    Tobacco Social History   Tobacco Use  Smoking Status Never Smoker  Smokeless Tobacco Never Used     Counseling given: Not Answered   Clinical Intake:  Pre-visit preparation completed: Yes  Pain : No/denies pain     Nutritional Status: BMI > 30  Obese Nutritional Risks: None  How often do you need to have someone help you when you read instructions, pamphlets, or other written materials from your doctor or pharmacy?: 1 - Never What is the last grade level you completed in school?: college  Nutrition Risk Assessment:  Has the patient had any N/V/D within the last 2 months?  No  Does the patient have any non-healing wounds?  No  Has the patient had any unintentional weight loss or weight gain?  No   Diabetes:  Is the patient diabetic?  Yes  If diabetic, was a CBG obtained today?  No  Did the patient bring in their glucometer from home?  n/a How often do you monitor  your CBG's? As needed   Financial Strains and Diabetes Management:  Are you having any financial strains with the device, your supplies or your medication? No .  Does the patient want to be seen by Chronic Care Management for management of their diabetes?  No  Would the patient like to be referred to a Nutritionist or for Diabetic Management?  No   Diabetic Exams:  Diabetic Eye Exam: Completed at [facility] in [place] a couple weeks ago. Will call for results.   Diabetic Foot Exam: due, will complete at next in office visit.  Interpreter Needed?: No  Information entered by ::  ,LPN  Past Medical History:  Diagnosis Date  . Asthma   . Diabetes mellitus without complication ([place])   . Hyperlipidemia   . Sleep apnea    Past Surgical History:  Procedure Laterality Date  . HERNIA REPAIR     Family History  Problem Relation Age of Onset  . Diabetes Mother   . Hypertension Father   . Diabetes Sister   . Diabetes Brother   . Diabetes Maternal Grandmother    Social History   Socioeconomic History  . Marital status: Married    Spouse name: Not on file  . Number of children: Not on file  . Years of education: Not on file  . Highest education level: Not on file  Occupational History  . 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 Food insecurity    Worry: Never true    Inability: Never true  . Transportation needs    Medical: No    Non-medical: No  Tobacco Use  . Smoking status: Never Smoker  . Smokeless tobacco: Never Used  Substance and Sexual Activity  . Alcohol use: No    Comment: rare  . Drug use: No  . Sexual activity: Not on file  Lifestyle  . Physical activity    Days per week: 6 days    Minutes per session: 20 min  . Stress: Not at all  Relationships  . Social connections    Talks on phone: More than three times a week    Gets together: More than three times a week    Attends religious service:  More than 4 times per year    Active member of club or organization: Yes    Attends meetings of clubs or organizations: More than 4 times per year    Relationship status: Married  Other Topics Concern  . 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 aspirin EC 325 MG tablet Take 325 mg by mouth daily.  Marland Kitchen atorvastatin (LIPITOR) 20 MG tablet Take 1 tablet (20 mg total) by mouth daily.  . benazepril (LOTENSIN) 40 MG tablet Take 1 tablet (40 mg total) by mouth daily.  . metFORMIN (GLUCOPHAGE) 500 MG tablet Take 2 tablets (1,000 mg total) by mouth 2 (two) times daily with a meal.  . Multiple Vitamins-Minerals (CENTRUM SILVER 50+MEN) TABS Take by mouth.   No facility-administered encounter medications on file as of 08/12/2018.     Activities of Daily Living In your present state of health, do you have any difficulty performing the following activities: 08/12/2018  Hearing? Y  Vision? N  Difficulty concentrating or making decisions? N  Walking or climbing stairs? N  Dressing or bathing? N  Doing errands, shopping? N  Preparing Food and eating ? N  Using the Toilet? N  In the past six months, have you accidently leaked urine? N  Do you have problems with loss of bowel control? N  Managing your Medications? N  Managing your Finances? N  Housekeeping or managing your Housekeeping? N  Some recent data might be hidden    Patient Care Team: Guadalupe Maple, MD as PCP - General (Family Medicine)   Assessment:   This is a routine wellness examination for [place].  Exercise Activities and Dietary recommendations Current Exercise Habits: Home exercise routine, Type of exercise: walking, Time (Minutes): 20, Frequency (Times/Week): 5, Weekly Exercise (Minutes/Week): 100, Intensity: Mild, Exercise limited by: None identified  Goals    . DIET - INCREASE WATER  INTAKE     Recommend drinking at least 6-8 glasses of water a day        Fall Risk Fall Risk  08/12/2018 08/08/2017 05/23/2016 02/19/2015 12/30/2014  Falls in the past year? 0 No No No No   FALL RISK PREVENTION PERTAINING TO THE HOME:  Any stairs in or around the home? Yes  If so, are there any without handrails? No   Home free of loose throw rugs in walkways, pet beds, electrical cords, etc? Yes  Adequate lighting in your home to reduce risk of falls? Yes   ASSISTIVE DEVICES UTILIZED TO PREVENT FALLS:  Life alert?  No  Use of a cane, walker or w/c? No  Grab bars in the bathroom? No  Shower chair or bench in shower? No  Elevated toilet seat or a handicapped toilet? No    TIMED UP AND GO:  Unable to perform    Depression Screen PHQ 2/9 Scores 08/12/2018 08/08/2017 05/23/2016 12/30/2014  PHQ - 2 Score 0 0 0 0    Cognitive Function     6CIT Screen 08/08/2017  What Year? 0 points  What month? 0 points  What time? 0 points  Count back from 20 0 points  Months in reverse 0 points  Repeat phrase 0 points  Total Score 0    Immunization History  Administered Date(s) Administered  . Influenza, High Dose Seasonal PF 12/29/2013, 11/15/2015  . Influenza,inj,Quad PF,6+ Mos 12/30/2014  . Influenza-Unspecified 12/25/2016  . Pneumococcal Conjugate-13 12/30/2014  . Pneumococcal-Unspecified 06/16/2011  . Tdap 03/15/2011  . Zoster 06/16/2011    Qualifies for Shingles Vaccine? Yes  Zostavax completed 06/16/2011. Due for Shingrix. Education has been provided regarding the importance of this vaccine. Pt has been advised to call insurance company to determine out of pocket expense. Advised may also receive vaccine at local pharmacy or Health Dept. Verbalized acceptance and understanding.  Tdap: up to date   Flu Vaccine: up to date   Pneumococcal Vaccine: Due for Pneumococcal vaccine. Will receive at next in office visit.   Screening Tests Health Maintenance  Topic Date Due  .  OPHTHALMOLOGY EXAM  12/02/2015  . PNA vac Low Risk Adult (2 of 2 - PPSV23) 06/15/2016  . FOOT EXAM  05/23/2017  . HEMOGLOBIN A1C  02/07/2018  . INFLUENZA VACCINE  09/28/2018  . TETANUS/TDAP  03/14/2021  . COLONOSCOPY  06/28/2021  . Hepatitis C Screening  Completed   Cancer Screenings:  Colorectal Screening: Completed 06/29/2011. Repeat every 10 years  Lung Cancer Screening: (Low Dose CT Chest recommended if Age 65-80 years, 30 pack-year currently smoking OR have quit w/in 15years.) does not qualify.    Additional Screening:  Hepatitis C Screening: does qualify; Completed 08/08/2017  Dental Screening: Recommended annual dental exams for proper oral hygiene  Community Resource Referral:  CRR required this visit?  No        Plan:  I have personally reviewed and addressed the Medicare Annual Wellness questionnaire and have noted the following in the patient's chart:  A. Medical and social history B. Use of alcohol, tobacco or illicit drugs  C. Current medications and supplements D. Functional ability and status E.  Nutritional status F.  Physical activity G. Advance directives H. List of other physicians I.  Hospitalizations, surgeries, and ER visits in previous 12 months J.  [place] such as hearing and vision if needed, cognitive and depression L. Referrals and appointments   In addition, I have reviewed and discussed with patient certain preventive protocols, quality metrics, and best practice recommendations. A written personalized care plan for preventive services as well as general preventive health recommendations were provided to patient.   Signed,   Bevelyn Ngo, LPN  [id number] Nurse Health Advisor  Nurse Notes: none

## 2018-08-29 ENCOUNTER — Encounter: Payer: Medicare HMO | Admitting: Family Medicine

## 2018-08-29 DIAGNOSIS — R220 Localized swelling, mass and lump, head: Secondary | ICD-10-CM | POA: Diagnosis not present

## 2018-09-02 DIAGNOSIS — K1121 Acute sialoadenitis: Secondary | ICD-10-CM | POA: Diagnosis not present

## 2018-09-07 ENCOUNTER — Other Ambulatory Visit: Payer: Self-pay | Admitting: Family Medicine

## 2018-09-07 DIAGNOSIS — E119 Type 2 diabetes mellitus without complications: Secondary | ICD-10-CM

## 2018-09-07 DIAGNOSIS — E7800 Pure hypercholesterolemia, unspecified: Secondary | ICD-10-CM

## 2018-09-07 DIAGNOSIS — I10 Essential (primary) hypertension: Secondary | ICD-10-CM

## 2018-09-09 NOTE — Telephone Encounter (Signed)
Please advise, looks like pt be due for some follow up blood work as well

## 2018-11-19 DIAGNOSIS — L821 Other seborrheic keratosis: Secondary | ICD-10-CM | POA: Diagnosis not present

## 2018-11-19 DIAGNOSIS — L814 Other melanin hyperpigmentation: Secondary | ICD-10-CM | POA: Diagnosis not present

## 2018-11-19 DIAGNOSIS — I781 Nevus, non-neoplastic: Secondary | ICD-10-CM | POA: Diagnosis not present

## 2019-01-07 DIAGNOSIS — R69 Illness, unspecified: Secondary | ICD-10-CM | POA: Diagnosis not present

## 2019-03-19 ENCOUNTER — Encounter: Payer: Self-pay | Admitting: Nurse Practitioner

## 2019-03-19 ENCOUNTER — Ambulatory Visit (INDEPENDENT_AMBULATORY_CARE_PROVIDER_SITE_OTHER): Payer: Medicare HMO | Admitting: Nurse Practitioner

## 2019-03-19 ENCOUNTER — Telehealth: Payer: Self-pay

## 2019-03-19 ENCOUNTER — Ambulatory Visit: Payer: Self-pay | Admitting: *Deleted

## 2019-03-19 ENCOUNTER — Other Ambulatory Visit: Payer: Self-pay

## 2019-03-19 VITALS — BP 108/66 | HR 81 | Temp 98.2°F

## 2019-03-19 DIAGNOSIS — I10 Essential (primary) hypertension: Secondary | ICD-10-CM

## 2019-03-19 DIAGNOSIS — E1165 Type 2 diabetes mellitus with hyperglycemia: Secondary | ICD-10-CM

## 2019-03-19 DIAGNOSIS — I152 Hypertension secondary to endocrine disorders: Secondary | ICD-10-CM

## 2019-03-19 DIAGNOSIS — E785 Hyperlipidemia, unspecified: Secondary | ICD-10-CM | POA: Diagnosis not present

## 2019-03-19 DIAGNOSIS — E1159 Type 2 diabetes mellitus with other circulatory complications: Secondary | ICD-10-CM

## 2019-03-19 DIAGNOSIS — E1169 Type 2 diabetes mellitus with other specified complication: Secondary | ICD-10-CM | POA: Diagnosis not present

## 2019-03-19 MED ORDER — INSULIN GLARGINE 100 UNIT/ML SOLOSTAR PEN: 10.0000 [IU] | PEN_INJECTOR | Freq: Every day | SUBCUTANEOUS | 3 refills | Status: DC

## 2019-03-19 MED ORDER — INSULIN PEN NEEDLE 31G X 8 MM MISC: 3 refills | Status: DC

## 2019-03-19 MED ORDER — INSULIN DETEMIR 100 UNIT/ML FLEXPEN: 10.0000 [IU] | PEN_INJECTOR | Freq: Every day | SUBCUTANEOUS | 3 refills | Status: DC

## 2019-03-19 NOTE — Telephone Encounter (Signed)
Scheduled appt for 2:45 today with Jolene

## 2019-03-19 NOTE — Telephone Encounter (Signed)
Last A1C 6.8% in June 2020.  Please see if he can come into office for visit today or tomorrow with someone face to face.  Need to repeat A1C and check on patient via visit.  IF any hyperglycemia symptoms, which nurse educated him on, then immediately go to ER.

## 2019-03-19 NOTE — Telephone Encounter (Signed)
Pt Checked blood sugar this morning before breakfast and it was 370 and 520 after eating almond milk and rice Krispies and splenda. 3 hours after eating he recked his blood sugar and he stated that it was 900. He had not had anything else to eat. I asked him if he had washed his hands with second check and he had not. Advised that he could have had something on his hands that would give him an incorrect reading. Asked him to recheck his blood sugar after washing his hands. And it was 479 this time. Advised him to start drinking water to get his blood sugar down. He has missed a few doses of his metformin last week and over the weekend. He denies blurred vision, weakness, nausea or vomiting.  Advised him that if he starts feeling those to go to the ED. He voiced understanding. Per protocol, he needs to speak with a provider at office. Sent a message in [employer] regarding this patient. Call conference in with the practice.   Reason for Disposition . Blood glucose > 400 mg/dL (22.2 mmol/L)  Answer Assessment - Initial Assessment Questions 1. BLOOD GLUCOSE: "What is your blood glucose level?"      370 before breakfast 2. ONSET: "When did you check the blood glucose?"     Before breakfast  3. USUAL RANGE: "What is your glucose level usually?" (e.g., usual fasting morning value, usual evening value)     Had not been checking his blood sugar 4. KETONES: "Do you check for ketones (urine or blood test strips)?" If yes, ask: "What does the test show now?"       5. TYPE 1 or 2:  "Do you know what type of diabetes you have?"  (e.g., Type 1, Type 2, Gestational; doesn't know)    Type 2 6. INSULIN: "Do you take insulin?" "What type of insulin(s) do you use? What is the mode of delivery? (syringe, pen; injection or pump)?"      no 7. DIABETES PILLS: "Do you take any pills for your diabetes?" If yes, ask: "Have you missed taking any pills recently?"     Takes metformin and has missed a few  days last week 8. OTHER SYMPTOMS: "Do you have any symptoms?" (e.g., fever, frequent urination, difficulty breathing, dizziness, weakness, vomiting)     Frequent urination 9. PREGNANCY: "Is there any chance you are pregnant?" "When was your last menstrual period?"     n/a  Protocols used: DIABETES - HIGH BLOOD SUGAR-A-AH

## 2019-03-19 NOTE — Patient Instructions (Addendum)
Hyperglycemia Hyperglycemia is when the sugar (glucose) level in your blood is too high. It may not cause symptoms. If you do have symptoms, they may include warning signs, such as:  Feeling more thirsty than normal.  Hunger.  Feeling tired.  Needing to pee (urinate) more than normal.  Blurry eyesight (vision). You may get other symptoms as it gets worse, such as:  Dry mouth.  Not being hungry (loss of appetite).  Fruity-smelling breath.  Weakness.  Weight gain or loss that is not planned. Weight loss may be fast.  A tingling or numb feeling in your hands or feet.  Headache.  Skin that does not bounce back quickly when it is lightly pinched and released (poor skin turgor).  Pain in your belly (abdomen).  Cuts or bruises that heal slowly. High blood sugar can happen to people who do or do not have diabetes. High blood sugar can happen slowly or quickly, and it can be an emergency. Follow these instructions at home: General instructions  Take over-the-counter and prescription medicines only as told by your doctor.  Do not use products that contain nicotine or tobacco, such as cigarettes and e-cigarettes. If you need help quitting, ask your doctor.  Limit alcohol intake to no more than 1 drink per day for nonpregnant women and 2 drinks per day for men. One drink equals 12 oz of beer, 5 oz of wine, or 1 oz of hard liquor.  Manage stress. If you need help with this, ask your doctor.  Keep all follow-up visits as told by your doctor. This is important. Eating and drinking   Stay at a healthy weight.  Exercise regularly, as told by your doctor.  Drink enough fluid, especially when you: ? Exercise. ? Get sick. ? Are in hot temperatures.  Eat healthy foods, such as: ? Low-fat (lean) proteins. ? Complex carbs (complex carbohydrates), such as whole wheat bread or brown rice. ? Fresh fruits and vegetables. ? Low-fat dairy products. ? Healthy fats.  Drink enough  fluid to keep your pee (urine) clear or pale yellow. If you have diabetes:   Make sure you know the symptoms of hyperglycemia.  Follow your diabetes management plan, as told by your doctor. Make sure you: ? Take insulin and medicines as told. ? Follow your exercise plan. ? Follow your meal plan. Eat on time. Do not skip meals. ? Check your blood sugar as often as told. Make sure to check before and after exercise. If you exercise longer or in a different way than you normally do, check your blood sugar more often. ? Follow your sick day plan whenever you cannot eat or drink normally. Make this plan ahead of time with your doctor.  Share your diabetes management plan with people in your workplace, school, and household.  Check your urine for ketones when you are ill and as told by your doctor.  Carry a card or wear jewelry that says that you have diabetes. Contact a doctor if:  Your blood sugar level is higher than 240 mg/dL (13.3 mmol/L) for 2 days in a row.  You have problems keeping your blood sugar in your target range.  High blood sugar happens often for you. Get help right away if:  You have trouble breathing.  You have a change in how you think, feel, or act (mental status).  You feel sick to your stomach (nauseous), and that feeling does not go away.  You cannot stop throwing up (vomiting). These symptoms may   be an emergency. Do not wait to see if the symptoms will go away. Get medical help right away. Call your local emergency services (911 in the [place] Do not drive yourself to the hospital. Summary  Hyperglycemia is when the sugar (glucose) level in your blood is too high.  High blood sugar can happen to people who do or do not have diabetes.  Make sure you drink enough fluids, eat healthy foods, and exercise regularly.  Contact your doctor if you have problems keeping your blood sugar in your target range. This information is not intended to replace advice given  to you by your health care provider. Make sure you discuss any questions you have with your health care provider. Document Revised: 11/01/2015 Document Reviewed: 11/01/2015 Elsevier Patient Education  [place].   Insulin Glargine injection What is this medicine? INSULIN GLARGINE (IN su lin GLAR geen) is a human-made form of insulin. This drug lowers the amount of sugar in your blood. It is a long-acting insulin that is usually given once a day. This medicine may be used for other purposes; ask your health care provider or pharmacist if you have questions. COMMON BRAND NAME(S): BASAGLAR, Lantus, Lantus SoloStar, Semglee, Toujeo Max SoloStar, [employer] What should I tell my health care provider before I take this medicine? They need to know if you have any of these conditions:  episodes of low blood sugar  eye disease, vision problems  kidney disease  liver disease  an unusual or allergic reaction to insulin, metacresol, other medicines, foods, dyes, or preservatives  pregnant or trying to get pregnant  breast-feeding How should I use this medicine? This medicine is for injection under the skin. Use this medicine at the same time each day. Use exactly as directed. This insulin should never be mixed in the same syringe with other insulins before injection. Do not vigorously shake before use. You will be taught how to use this medicine and how to adjust doses for activities and illness. Do not use more insulin than prescribed. Always check the appearance of your insulin before using it. This medicine should be clear and colorless like water. Do not use it if it is cloudy, thickened, colored, or has solid particles in it. If you use an insulin pen, be sure to take off the outer needle cover before using the dose. It is important that you put your used needles and syringes in a special sharps container. Do not put them in a trash can. If you do not have a sharps container,  call your pharmacist or healthcare provider to get one. This drug comes with INSTRUCTIONS FOR USE. Ask your pharmacist for directions on how to use this drug. Read the information carefully. Talk to your pharmacist or health care provider if you have questions. Talk to your pediatrician regarding the use of this medicine in children. While this drug may be prescribed for children as young as 6 years for selected conditions, precautions do apply. Overdosage: If you think you have taken too much of this medicine contact a poison control center or emergency room at once. NOTE: This medicine is only for you. Do not share this medicine with others. What if I miss a dose? It is important not to miss a dose. Your health care professional or doctor should discuss a plan for missed doses with you. If you do miss a dose, follow their plan. Do not take double doses. What may interact with this medicine?  other medicines  for diabetes Many medications may cause changes in blood sugar, these include:  alcohol containing beverages  antiviral medicines for HIV or AIDS  aspirin and aspirin-like drugs  certain medicines for blood pressure, heart disease, irregular heart beat  chromium  diuretics  male hormones, such as estrogens or progestins, birth control pills  fenofibrate  gemfibrozil  isoniazid  lanreotide  male hormones or anabolic steroids  MAOIs like Carbex, Eldepryl, Marplan, Nardil, and Parnate  medicines for weight loss  medicines for allergies, asthma, cold, or cough  medicines for depression, anxiety, or psychotic disturbances  niacin  nicotine  NSAIDs, medicines for pain and inflammation, like ibuprofen or naproxen  octreotide  pasireotide  pentamidine  phenytoin  probenecid  quinolone antibiotics such as ciprofloxacin, levofloxacin, ofloxacin  some herbal dietary supplements  steroid medicines such as prednisone or cortisone  sulfamethoxazole;  trimethoprim  thyroid hormones Some medications can hide the warning symptoms of low blood sugar (hypoglycemia). You may need to monitor your blood sugar more closely if you are taking one of these medications. These include:  beta-blockers, often used for high blood pressure or heart problems (examples include atenolol, metoprolol, propranolol)  clonidine  guanethidine  reserpine This list may not describe all possible interactions. Give your health care provider a list of all the medicines, herbs, non-prescription drugs, or dietary supplements you use. Also tell them if you smoke, drink alcohol, or use illegal drugs. Some items may interact with your medicine. What should I watch for while using this medicine? Visit your health care professional or doctor for regular checks on your progress. Do not drive, use machinery, or do anything that needs mental alertness until you know how this medicine affects you. Alcohol may interfere with the effect of this medicine. Avoid alcoholic drinks. A test called the HbA1C (A1C) will be monitored. This is a simple blood test. It measures your blood sugar control over the last 2 to 3 months. You will receive this test every 3 to 6 months. Learn how to check your blood sugar. Learn the symptoms of low and high blood sugar and how to manage them. Always carry a quick-source of sugar with you in case you have symptoms of low blood sugar. Examples include hard sugar candy or glucose tablets. Make sure others know that you can choke if you eat or drink when you develop serious symptoms of low blood sugar, such as seizures or unconsciousness. They must get medical help at once. Tell your doctor or health care professional if you have high blood sugar. You might need to change the dose of your medicine. If you are sick or exercising more than usual, you might need to change the dose of your medicine. Do not skip meals. Ask your doctor or health care professional if  you should avoid alcohol. Many nonprescription cough and cold products contain sugar or alcohol. These can affect blood sugar. Make sure that you have the right kind of syringe for the type of insulin you use. Try not to change the brand and type of insulin or syringe unless your health care professional or doctor tells you to. Switching insulin brand or type can cause dangerously high or low blood sugar. Always keep an extra supply of insulin, syringes, and needles on hand. Use a syringe one time only. Throw away syringe and needle in a closed container to prevent accidental needle sticks. Insulin pens and cartridges should never be shared. Even if the needle is changed, sharing may result in passing  of viruses like hepatitis or HIV. Each time you get a new box of pen needles, check to see if they are the same type as the ones you were trained to use. If not, ask your health care professional to show you how to use this new type properly. Wear a medical ID bracelet or chain, and carry a card that describes your disease and details of your medicine and dosage times. What side effects may I notice from receiving this medicine? Side effects that you should report to your doctor or health care professional as soon as possible:  allergic reactions like skin rash, itching or hives, swelling of the face, lips, or tongue  breathing problems  signs and symptoms of high blood sugar such as dizziness, dry mouth, dry skin, fruity breath, nausea, stomach pain, increased hunger or thirst, increased urination  signs and symptoms of low blood sugar such as feeling anxious, confusion, dizziness, increased hunger, unusually weak or tired, sweating, shakiness, cold, irritable, headache, blurred vision, fast heartbeat, loss of consciousness Side effects that usually do not require medical attention (report to your doctor or health care professional if they continue or are bothersome):  increase or decrease in fatty  tissue under the skin due to overuse of a particular injection site  itching, burning, swelling, or rash at site where injected This list may not describe all possible side effects. Call your doctor for medical advice about side effects. You may report side effects to FDA at 1-800-FDA-1088. Where should I keep my medicine? Keep out of the reach of children. Unopened Vials: Lantus vials: Store in a refrigerator between 2 and 8 degrees C (36 and 46 degrees F) or at room temperature below 30 degrees C (86 degrees F). Do not freeze or use if the insulin has been frozen. Protect from light and excessive heat. If stored at room temperature, the vial must be discarded after 28 days. Throw away any unopened and unused medicine that has been stored in the refrigerator after the expiration date. Unopened Pens: Neurosurgeon: Store in a refrigerator between 2 and 8 degrees C (36 and 46 degrees F) or at room temperature below 30 degrees C (86 degrees F). Do not freeze or use if the insulin has been frozen. Protect from light and excessive heat. If stored at room temperature, the pen must be discarded after 28 days. Throw away any unopened and unused medicine that has been stored in the refrigerator after the expiration date. Lantus Solostar Pens: Store in a refrigerator between 2 and 8 degrees C (36 and 46 degrees F) or at room temperature below 30 degrees C (86 degrees F). Do not freeze or use if the insulin has been frozen. Protect from light and excessive heat. If stored at room temperature, the pen must be discarded after 28 days. Throw away any unopened and unused medicine that has been stored in the refrigerator after the expiration date. Semglee Pens: Store in a refrigerator between 2 and 8 degrees C (36 and 46 degrees F) or at room temperature below 30 degrees C (86 degrees F). Do not freeze or use if the insulin has been frozen. Protect from light and excessive heat. If stored at room temperature, the  pen must be discarded after 28 days. Throw away any unopened and unused medicine that has been stored in the refrigerator after the expiration date. Toujeo Solostar Pens or Toujeo Max [employer] Pens: Store in a refrigerator between 2 and 8 degrees C (36 and 46  degrees F). Do not freeze or use if the insulin has been frozen. Protect from light and excessive heat. Throw away any unopened and unused medicine that has been stored in the refrigerator after the expiration date. Vials that you are using: Lantus vials: Store in a refrigerator or at room temperature below 30 degrees C (86 degrees F). Do not freeze. Keep away from heat and light. Throw the opened vial away after 28 days. Semglee vials: Store in a refrigerator or at room temperature below 30 degrees C (86 degrees F). Do not freeze. Keep away from heat and light. Throw the opened vial away after 28 days. Pens that you are using: Basaglar KwikPens: Store at room temperature below 30 degrees C (86 degrees F). Do not refrigerate or freeze. Keep away from heat and light. Throw the pen away after 28 days, even if it still has insulin left in it. Lantus Solostar Pens: Store at room temperature below 30 degrees C (86 degrees F). Do not refrigerate or freeze. Keep away from heat and light. Throw the pen away after 28 days, even if it still has insulin left in it. Semglee Pens: Store at room temperature below 30 degrees C (86 degrees F). Do not refrigerate or freeze. Keep away from heat and light. Throw the pen away after 28 days, even if it still has insulin left in it. Toujeo Solostar Pens or Toujeo Max [employer] Pens: Store at room temperature below 30 degrees C (86 degrees F). Do not refrigerate or freeze. Keep away from heat and light. Throw the pen away after 56 days, even if it still has insulin left in it. NOTE: This sheet is a summary. It may not cover all possible information. If you have questions about this medicine, talk to your doctor, pharmacist,  or health care provider.  2020 Elsevier/Gold Standard (2018-12-04 15:00:23)

## 2019-03-19 NOTE — Telephone Encounter (Signed)
Patient called stating that his blood sugar has been high today. Patient stated that when he first check it was 370, then he ate some breakfast and check sugar again, read 570. He was concerned and after few hours he check it again and it was 925 but patient thinks that this number was not accurate. Then he called the office ans spoke to a nurse, the nurse advised him to drink some water and after that the number went down to 420. Patient stated he feels fine, no symptoms. Patient stated he has been taking his metformin as prescribed except he missed some doses last week. Please advise

## 2019-03-19 NOTE — Assessment & Plan Note (Signed)
Chronic, stable with BP below goal.  Urine ALB 10 and A:C 30-300.  Continue Benazepril for kidney protection and HTN.  Obtain CMP today.  Recommend he monitor BP at home at least 3 mornings a week.

## 2019-03-19 NOTE — Telephone Encounter (Signed)
Please advise 

## 2019-03-19 NOTE — Progress Notes (Signed)
BP 108/66 (BP Location: Left Arm, Patient Position: Sitting, Cuff Size: Normal)   Pulse 81   Temp 98.2 F (36.8 C) (Oral)   SpO2 96%    Subjective:    Patient ID: Matthew [name], male    DOB: Jul 11, 1945, 74 y.o.   MRN: [id number]  HPI: Matthew [name] is a 74 y.o. male  Chief Complaint  Patient presents with  . Diabetes    Sugars high  . Urinary Frequency    every hour through the night.    DIABETES In June 2020 A1C was 6.8%, highest A1C in past was 11.9% in 2016.  Current medications include Metformin 1000 MG BID.  He reports he missed doses for about 1/2 week, did not take any, this was about 2-3 weeks ago.  Then this weekend he missed a couple days worth.  Is concerned because this morning fasting was 370 and then raised to 520 after breakfast (ate almond milk, Rice Krispie, and Splenda).  He reports three hours after eating it was 500.  Around noon reports it was 479.  Does endorse frequent poor diet choices at home and enjoying carbs.  Reports he started having increased urination and excessive thirst since summer time, states this has gotten worse in last 4 weeks and reports some weight loss during this time.  Endorses he has not checked sugar in probably about 6 months, his meter broke.  He went and got new meter yesterday and did not like how it looked, his sugars were elevated.    Has increased fluid intake and reports sugars have improved with this. Hypoglycemic episodes:no Polydipsia/polyuria: yes Visual disturbance: no Chest pain: no Paresthesias: no Glucose Monitoring: yes, only recently (today) started checking  Accucheck frequency: Daily -- plans to check now  Fasting glucose:  Post prandial:  Evening:  Before meals: Taking Insulin?: no  Long acting insulin:  Short acting insulin: Blood Pressure Monitoring: does not check Retinal Examination: Up to Date, 3 months ago Foot Exam: Up to Date Pneumovax: Up to Date Influenza: Up to Date Aspirin: yes    HYPERTENSION / HYPERLIPIDEMIA Continues on Benazepril and Atorvastatin.   Satisfied with current treatment? yes Duration of hypertension: chronic BP monitoring frequency: not checking BP range:  BP medication side effects: no Duration of hyperlipidemia: chronic Cholesterol medication side effects: no Cholesterol supplements: none Medication compliance: good compliance Aspirin: yes Recent stressors: no Recurrent headaches: no Visual changes: no Palpitations: no Dyspnea: no Chest pain: no Lower extremity edema: no Dizzy/lightheaded: no  Relevant past medical, surgical, family and social history reviewed and updated as indicated. Interim medical history since our last visit reviewed. Allergies and medications reviewed and updated.  Review of Systems  Constitutional: Negative for activity change, diaphoresis, fatigue and fever.  Respiratory: Negative for cough, chest tightness, shortness of breath and wheezing.   Cardiovascular: Negative for chest pain, palpitations and leg swelling.  Gastrointestinal: Negative.   Endocrine: Positive for polydipsia and polyuria. Negative for cold intolerance, heat intolerance and polyphagia.  Neurological: Negative.   Psychiatric/Behavioral: Negative.     Per HPI unless specifically indicated above     Objective:    BP 108/66 (BP Location: Left Arm, Patient Position: Sitting, Cuff Size: Normal)   Pulse 81   Temp 98.2 F (36.8 C) (Oral)   SpO2 96%   Wt Readings from Last 3 Encounters:  08/12/18 224 lb (101.6 kg)  08/08/17 211 lb (95.7 kg)  08/08/17 211 lb (95.7 kg)    Physical Exam Vitals  and nursing note reviewed.  Constitutional:      General: He is awake. He is not in acute distress.    Appearance: He is well-developed and overweight. He is not ill-appearing.  HENT:     Head: Normocephalic and atraumatic.     Right Ear: Hearing normal. No drainage.     Left Ear: Hearing normal. No drainage.  Eyes:     General: Lids are  normal.        Right eye: No discharge.        Left eye: No discharge.     Conjunctiva/sclera: Conjunctivae normal.     Pupils: Pupils are equal, round, and reactive to light.  Neck:     Thyroid: No thyromegaly.     Vascular: No carotid bruit.  Cardiovascular:     Rate and Rhythm: Normal rate and regular rhythm.     Heart sounds: Normal heart sounds, S1 normal and S2 normal. No murmur. No gallop.   Pulmonary:     Effort: Pulmonary effort is normal. No accessory muscle usage or respiratory distress.     Breath sounds: Normal breath sounds.  Abdominal:     General: Bowel sounds are normal.     Palpations: Abdomen is soft.     Tenderness: There is no abdominal tenderness.  Musculoskeletal:        General: Normal range of motion.     Cervical back: Normal range of motion and neck supple.     Right lower leg: No edema.     Left lower leg: No edema.  Skin:    General: Skin is warm and dry.     Capillary Refill: Capillary refill takes less than 2 seconds.  Neurological:     Mental Status: He is alert and oriented to person, place, and time.     Deep Tendon Reflexes: Reflexes are normal and symmetric.  Psychiatric:        Attention and Perception: Attention normal.        Mood and Affect: Mood normal.        Speech: Speech normal.        Behavior: Behavior normal. Behavior is cooperative.        Thought Content: Thought content normal.        Judgment: Judgment normal.     Results for orders placed or performed in visit on 08/08/17  Bayer DCA Hb A1c Waived  Result Value Ref Range   HB A1C (BAYER DCA - WAIVED) 6.8 <7.0 %      Assessment & Plan:   Problem List Items Addressed This Visit      Cardiovascular and Mediastinum   Hypertension associated with diabetes ([place])    Chronic, stable with BP below goal.  Urine ALB 10 and A:C 30-300.  Continue Benazepril for kidney protection and HTN.  Obtain CMP today.  Recommend he monitor BP at home at least 3 mornings a week.       Relevant Medications   Insulin Detemir (LEVEMIR) 100 UNIT/ML Pen   Other Relevant Orders   Microalbumin, Urine Waived   Bayer DCA Hb A1c Waived   Comprehensive metabolic panel     Endocrine   Type 2 diabetes mellitus with hyperglycemia (HCC) - Primary    Chronic with current symptomatic hyperglycemia.  BS in clinic 294 and A1C >14.  Discussed at length with him need to initiate long acting insulin and educated him on this, goal in long run is to lower A1C and sugars with transition  off insulin in future and move towards GLP.  Levemir script sent in, covered by insurance, start tonight with 10 units.  Continue Metformin.  Recommend he continue good hydration and monitor diet closely.  NOT to miss doses of medication.  Check BS three times a day and document.  CCM urgent referral placed.  He is to notify provider if BS <70 or >400 present. If any worsening symptoms immediately go to ER.   Return to office in 5 days.        Relevant Medications   Insulin Detemir (LEVEMIR) 100 UNIT/ML Pen   Other Relevant Orders   Glucose Hemocue Waived   Microalbumin, Urine Waived   Bayer DCA Hb A1c Waived   Referral to Chronic Care Management Services   Hyperlipidemia associated with type 2 diabetes mellitus (HCC)    Chronic, ongoing.  Continue current medication regimen and adjust as needed.  Current LDL59 and TCHOL 150.        Relevant Medications   Insulin Detemir (LEVEMIR) 100 UNIT/ML Pen   Other Relevant Orders   Bayer DCA Hb A1c Waived   Lipid Panel [place], Waived   Comprehensive metabolic panel       Follow up plan: Return in about 5 days (around 03/24/2019) for Diabetes.

## 2019-03-19 NOTE — Assessment & Plan Note (Addendum)
Chronic with current symptomatic hyperglycemia.  BS in clinic 294 and A1C >14.  Discussed at length with him need to initiate long acting insulin and educated him on this, goal in long run is to lower A1C and sugars with transition off insulin in future and move towards GLP.  Levemir script sent in, covered by insurance, start tonight with 10 units.  Continue Metformin.  Recommend he continue good hydration and monitor diet closely.  NOT to miss doses of medication.  Check BS three times a day and document.  CCM urgent referral placed.  He is to notify provider if BS <70 or >400 present. If any worsening symptoms immediately go to ER.   Return to office in 5 days.

## 2019-03-19 NOTE — Assessment & Plan Note (Signed)
Chronic, ongoing.  Continue current medication regimen and adjust as needed.  Current LDL59 and TCHOL 150.

## 2019-03-19 NOTE — Telephone Encounter (Signed)
Needs appointment- today if possible

## 2019-03-20 LAB — COMPREHENSIVE METABOLIC PANEL
ALT: 23 IU/L (ref 0–44)
AST: 16 IU/L (ref 0–40)
Albumin/Globulin Ratio: 2.3 — ABNORMAL HIGH (ref 1.2–2.2)
Albumin: 4.3 g/dL (ref 3.7–4.7)
Alkaline Phosphatase: 84 IU/L (ref 39–117)
BUN/Creatinine Ratio: 13 (ref 10–24)
BUN: 20 mg/dL (ref 8–27)
Bilirubin Total: 0.4 mg/dL (ref 0.0–1.2)
CO2: 25 mmol/L (ref 20–29)
Calcium: 9.7 mg/dL (ref 8.6–10.2)
Chloride: 93 mmol/L — ABNORMAL LOW (ref 96–106)
Creatinine, Ser: 1.57 mg/dL — ABNORMAL HIGH (ref 0.76–1.27)
GFR calc Af Amer: 51 mL/min/{1.73_m2} — ABNORMAL LOW (ref >59)
GFR calc non Af Amer: 44 mL/min/{1.73_m2} — ABNORMAL LOW (ref >59)
Globulin, Total: 1.9 g/dL (ref 1.5–4.5)
Glucose: 287 mg/dL — ABNORMAL HIGH (ref 65–99)
Potassium: 4.3 mmol/L (ref 3.5–5.2)
Sodium: 131 mmol/L — ABNORMAL LOW (ref 134–144)
Total Protein: 6.2 g/dL (ref 6.0–8.5)

## 2019-03-20 LAB — LIPID PANEL PICCOLO, WAIVED
Chol/HDL Ratio Piccolo,Waive: 3.2 mg/dL
Cholesterol Piccolo, Waived: 150 mg/dL (ref <200)
HDL Chol Piccolo, Waived: 46 mg/dL — ABNORMAL LOW (ref >59)
LDL Chol Calc Piccolo Waived: 59 mg/dL (ref <100)
Triglycerides Piccolo,Waived: 224 mg/dL — ABNORMAL HIGH (ref <150)
VLDL Chol Calc Piccolo,Waive: 45 mg/dL — ABNORMAL HIGH (ref <30)

## 2019-03-20 LAB — MICROALBUMIN, URINE WAIVED
Creatinine, Urine Waived: 10 mg/dL (ref 10–300)
Microalb, Ur Waived: 10 mg/L (ref 0–19)

## 2019-03-20 LAB — BAYER DCA HB A1C WAIVED: HB A1C (BAYER DCA - WAIVED): >14 % — ABNORMAL HIGH (ref <7.0)

## 2019-03-20 LAB — GLUCOSE HEMOCUE WAIVED: Glu Hemocue Waived: 219 mg/dL — ABNORMAL HIGH (ref 65–99)

## 2019-03-20 NOTE — Progress Notes (Signed)
Spoke to patient on telephone to discussed labs.  Showing some kidney decline, discussed that this is related to high glucose levels and recommend to continue insulin and checking BS at home with goal of this improving once sugars trend downwards.  Sodium was slightly low, to be expected at this time due to hyperglycemia, recommend he try to add a little sodium to meals next few days and decrease some of his fluid intake.  His fasting sugar was 200 something this morning, he started insulin last night.  Recommend if his sugar is still >130 tomorrow morning then to increase to 12 units Levemir tomorrow night.  He is aware if worsening symptoms or BS <70 or >400 to immediately notify provider or go to ER.

## 2019-03-21 ENCOUNTER — Ambulatory Visit (INDEPENDENT_AMBULATORY_CARE_PROVIDER_SITE_OTHER): Payer: Medicare HMO | Admitting: General Practice

## 2019-03-21 ENCOUNTER — Encounter: Payer: Self-pay | Admitting: General Practice

## 2019-03-21 DIAGNOSIS — E1165 Type 2 diabetes mellitus with hyperglycemia: Secondary | ICD-10-CM | POA: Diagnosis not present

## 2019-03-21 DIAGNOSIS — E1169 Type 2 diabetes mellitus with other specified complication: Secondary | ICD-10-CM | POA: Diagnosis not present

## 2019-03-21 DIAGNOSIS — I10 Essential (primary) hypertension: Secondary | ICD-10-CM

## 2019-03-21 DIAGNOSIS — E785 Hyperlipidemia, unspecified: Secondary | ICD-10-CM

## 2019-03-21 NOTE — Chronic Care Management (AMB) (Signed)
Chronic Care Management   Initial Visit Note  03/21/2019 Name: Matthew [name] MRN: [id number] DOB: 1945/11/13  Referred by: Guadalupe Maple, MD Reason for referral : Chronic Care Management (Initial: DM/HTN/HLD Chronic disease management and care support)   Matthew [name] is a 74 y.o. year old male who is a primary care patient of Crissman, Jeannette How, MD. The CCM team was consulted for assistance with chronic disease management and care coordination needs related to HTN, HLD and DMII  Review of patient status, including review of consultants reports, relevant laboratory and other test results, and collaboration with appropriate care team members and the patient's provider was performed as part of comprehensive patient evaluation and provision of chronic care management services.    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 See Care Plan for related entries.   Medications: Outpatient Encounter Medications as of 03/21/2019  Medication Sig Note  . aspirin EC 325 MG tablet Take 325 mg by mouth daily.   Marland Kitchen atorvastatin (LIPITOR) 20 MG tablet Take 1 tablet (20 mg total) by mouth daily.   . benazepril (LOTENSIN) 40 MG tablet Take 1 tablet (40 mg total) by mouth daily.   . Insulin Detemir (LEVEMIR) 100 UNIT/ML Pen Inject 10 Units into the skin at bedtime. 03/21/2019: Likely to increase at next appointment on 03-24-2019  . Insulin Pen Needle 31G X 8 MM MISC Use one needle to administer insulin into skin nightly.   . metFORMIN (GLUCOPHAGE) 500 MG tablet Take 2 tablets (1,000 mg total) by mouth 2 (two) times daily with a meal.   . Multiple Vitamins-Minerals (CENTRUM SILVER 50+MEN) TABS Take by mouth.    No facility-administered encounter medications on file as of 03/21/2019.     Objective:     Chemistry      Component Value Date/Time   NA 131  (L) 03/19/2019 1648   K 4.3 03/19/2019 1648   CL 93 (L) 03/19/2019 1648   CO2 25 03/19/2019 1648   BUN 20 03/19/2019 1648   CREATININE 1.57 (H) 03/19/2019 1648      Component Value Date/Time   CALCIUM 9.7 03/19/2019 1648   ALKPHOS 84 03/19/2019 1648   AST 16 03/19/2019 1648   AST 33 11/15/2015 1542   ALT 23 03/19/2019 1648   ALT 30 11/15/2015 1542   BILITOT 0.4 03/19/2019 1648      Lab Results  Component Value Date   HGBA1C >14.0 (H) 03/19/2019   BP Readings from Last 3 Encounters:  03/19/19 108/66  08/08/17 118/60  08/08/17 118/60    Goals Addressed            This Visit's Progress   . RNCM: I need help with getting my Diabetes under control       Current Barriers:  . Chronic Disease Management support and education needs related to DM, HLD, and HTN  Nurse Case Manager Clinical Goal(s):  Marland Kitchen Over the next 60 days, patient will verbalize understanding of plan for DM, HLD, and HTN management  . Over the next 60 to 90 days days, patient will demonstrate a decrease in hyperglycemic exacerbations as evidenced by decreased blood glucose levels and Hemoglobin A1C ,14 . Over the next 30 days, patient will attend all scheduled medical appointments: next appointment with pcp on 03-24-2019 . Over the next 30 days, patient will demonstrate improved adherence to prescribed treatment plan for  DM as evidenced by improved signs and symptoms of hyperglycemia, blood sugar levels decreasing <300's, improved Hemoglobin A1C . Over the next 60 days, patient will demonstrate improved health management independence as evidenced by following a heart healthy/ADA Carb modified diet . Over the next 60 days, patient will demonstrate understanding of rationale for each prescribed medication as evidenced by taking medications as prescribed and talking to the pcp before adjusting insulin doses  Interventions:  . Evaluation of current treatment plan related to DM, HLD, and HTN and patient's adherence to  plan as established by provider. . Advised patient to call pcp for Blood sugar readings <90 or >400 . Provided education to patient re: ADA/ carb modified diet; the patient states his wife is going to the book store to get a cookbook and information on diabetic diets this weekend . Reviewed medications with patient and discussed asking the pcp questions related to meal coverage insulin and taking medications as prescribed,  The patient is concerned that he has been taking Levimir for 2 days and his blood sugars are still >300. Encouraged the patient to talk to pcp before changing or altering his insulin dose . Discussed plans with patient for ongoing care management follow up and provided patient with direct contact information for care management team . Provided patient and/or caregiver with COVID19 information about community resources to obtain C19 vaccinations . Provided patient with foot care educational materials related to slower wound healing in patients with DM and also other complication related to other chronic disease processes of HLD and HTN . Advised patient, providing education and rationale, to check cbg three times a day and record, calling pcp for findings outside established parameters.    Patient Self Care Activities:  . Patient verbalizes understanding of plan to work with the interdisciplinary team to improve diabetes management and other chronic conditions of HLD and HTN . Self administers medications as prescribed . Attends all scheduled provider appointments . Unable to independently manage Chronic disease processes of DM, HLD, and HTN  Initial goal documentation         Matthew [name] was given information about Chronic Care Management services today including:  1. CCM service includes personalized support from designated clinical staff supervised by his physician, including individualized plan of care and coordination with other care providers 2. 24/7 contact phone  numbers for assistance for urgent and routine care needs. 3. Service will only be billed when office clinical staff spend 20 minutes or more in a month to coordinate care. 4. Only one practitioner may furnish and bill the service in a calendar month. 5. The patient may stop CCM services at any time (effective at the end of the month) by phone call to the office staff. 6. The patient will be responsible for cost sharing (co-pay) of up to 20% of the service fee (after annual deductible is met).  Patient agreed to services and verbal consent obtained.   Plan:   Telephone follow up appointment with care management team member scheduled for:04/23/2019 at [place], MSN, [facility] Mobile: [phone]

## 2019-03-21 NOTE — Patient Instructions (Addendum)
Visit Information  Goals Addressed            This Visit's Progress   . RNCM: I need help with getting my Diabetes under control       Current Barriers:  . Chronic Disease Management support and education needs related to DM, HLD, and HTN  Nurse Case Manager Clinical Goal(s):  Marland Kitchen Over the next 60 days, patient will verbalize understanding of plan for DM, HLD, and HTN management  . Over the next 60 to 90 days days, patient will demonstrate a decrease in hyperglycemic exacerbations as evidenced by decreased blood glucose levels and Hemoglobin A1C ,14 . Over the next 30 days, patient will attend all scheduled medical appointments: next appointment with pcp on 03-24-2019 . Over the next 30 days, patient will demonstrate improved adherence to prescribed treatment plan for DM as evidenced by improved signs and symptoms of hyperglycemia, blood sugar levels decreasing <300's, improved Hemoglobin A1C . Over the next 60 days, patient will demonstrate improved health management independence as evidenced by following a heart healthy/ADA Carb modified diet . Over the next 60 days, patient will demonstrate understanding of rationale for each prescribed medication as evidenced by taking medications as prescribed and talking to the pcp before adjusting insulin doses  Interventions:  . Evaluation of current treatment plan related to DM, HLD, and HTN and patient's adherence to plan as established by provider. . Advised patient to call pcp for Blood sugar readings <90 or >400 . Provided education to patient re: ADA/ carb modified diet; the patient states his wife is going to the book store to get a cookbook and information on diabetic diets this weekend . Reviewed medications with patient and discussed asking the pcp questions related to meal coverage insulin and taking medications as prescribed,  The patient is concerned that he has been taking Levimir for 2 days and his blood sugars are still >300.  Encouraged the patient to talk to pcp before changing or altering his insulin dose . Discussed plans with patient for ongoing care management follow up and provided patient with direct contact information for care management team . Provided patient and/or caregiver with COVID19 information about community resources to obtain C19 vaccinations . Provided patient with foot care educational materials related to slower wound healing in patients with DM and also other complication related to other chronic disease processes of HLD and HTN . Advised patient, providing education and rationale, to check cbg three times a day and record, calling pcp for findings outside established parameters.    Patient Self Care Activities:  . Patient verbalizes understanding of plan to work with the interdisciplinary team to improve diabetes management and other chronic conditions of HLD and HTN . Self administers medications as prescribed . Attends all scheduled provider appointments . Unable to independently manage Chronic disease processes of DM, HLD, and HTN  Initial goal documentation        Matthew [name] was given information about Chronic Care Management services today including:  1. CCM service includes personalized support from designated clinical staff supervised by his physician, including individualized plan of care and coordination with other care providers 2. 24/7 contact phone numbers for assistance for urgent and routine care needs. 3. Service will only be billed when office clinical staff spend 20 minutes or more in a month to coordinate care. 4. Only one practitioner may furnish and bill the service in a calendar month. 5. The patient may stop CCM services at any time (effective  at the end of the month) by phone call to the office staff. 6. The patient will be responsible for cost sharing (co-pay) of up to 20% of the service fee (after annual deductible is met).  Patient agreed to services and verbal  consent obtained.   The patient verbalized understanding of instructions provided today and declined a print copy of patient instruction materials.   Telephone follow up appointment with care management team member scheduled for: 04/23/2019 at 71 am  Addendum to print AVS summary (03-24-2019)  Noreene Larsson RN, MSN, [facility] Mobile: [phone]  Diabetes Mellitus and Foot Care Foot care is an important part of your health, especially when you have diabetes. Diabetes may cause you to have problems because of poor blood flow (circulation) to your feet and legs, which can cause your skin to:  Become thinner and drier.  Break more easily.  Heal more slowly.  Peel and crack. You may also have nerve damage (neuropathy) in your legs and feet, causing decreased feeling in them. This means that you may not notice minor injuries to your feet that could lead to more serious problems. Noticing and addressing any potential problems early is the best way to prevent future foot problems. How to care for your feet Foot hygiene  Wash your feet daily with warm water and mild soap. Do not use hot water. Then, pat your feet and the areas between your toes until they are completely dry. Do not soak your feet as this can dry your skin.  Trim your toenails straight across. Do not dig under them or around the cuticle. File the edges of your nails with an emery board or nail file.  Apply a moisturizing lotion or petroleum jelly to the skin on your feet and to dry, brittle toenails. Use lotion that does not contain alcohol and is unscented. Do not apply lotion between your toes. Shoes and socks  Wear clean socks or stockings every day. Make sure they are not too tight. Do not wear knee-high stockings since they may decrease blood flow to your legs.  Wear shoes that fit properly and have enough cushioning. Always look in your  shoes before you put them on to be sure there are no objects inside.  To break in new shoes, wear them for just a few hours a day. This prevents injuries on your feet. Wounds, scrapes, corns, and calluses  Check your feet daily for blisters, cuts, bruises, sores, and redness. If you cannot see the bottom of your feet, use a mirror or ask someone for help.  Do not cut corns or calluses or try to remove them with medicine.  If you find a minor scrape, cut, or break in the skin on your feet, keep it and the skin around it clean and dry. You may clean these areas with mild soap and water. Do not clean the area with peroxide, alcohol, or iodine.  If you have a wound, scrape, corn, or callus on your foot, look at it several times a day to make sure it is healing and not infected. Check for: ? Redness, swelling, or pain. ? Fluid or blood. ? Warmth. ? Pus or a bad smell. General instructions  Do not cross your legs. This may decrease blood flow to your feet.  Do not use heating pads or hot water bottles on your feet. They may burn your skin. If you have lost feeling in  your feet or legs, you may not know this is happening until it is too late.  Protect your feet from hot and cold by wearing shoes, such as at the beach or on hot pavement.  Schedule a complete foot exam at least once a year (annually) or more often if you have foot problems. If you have foot problems, report any cuts, sores, or bruises to your health care provider immediately. Contact a health care provider if:  You have a medical condition that increases your risk of infection and you have any cuts, sores, or bruises on your feet.  You have an injury that is not healing.  You have redness on your legs or feet.  You feel burning or tingling in your legs or feet.  You have pain or cramps in your legs and feet.  Your legs or feet are numb.  Your feet always feel cold.  You have pain around a toenail. Get help right away  if:  You have a wound, scrape, corn, or callus on your foot and: ? You have pain, swelling, or redness that gets worse. ? You have fluid or blood coming from the wound, scrape, corn, or callus. ? Your wound, scrape, corn, or callus feels warm to the touch. ? You have pus or a bad smell coming from the wound, scrape, corn, or callus. ? You have a fever. ? You have a red line going up your leg. Summary  Check your feet every day for cuts, sores, red spots, swelling, and blisters.  Moisturize feet and legs daily.  Wear shoes that fit properly and have enough cushioning.  If you have foot problems, report any cuts, sores, or bruises to your health care provider immediately.  Schedule a complete foot exam at least once a year (annually) or more often if you have foot problems. This information is not intended to replace advice given to you by your health care provider. Make sure you discuss any questions you have with your health care provider. Document Revised: 11/06/2018 Document Reviewed: 03/17/2016 Elsevier Patient Education  [place].  Diabetes Mellitus and Nutrition, Adult When you have diabetes (diabetes mellitus), it is very important to have healthy eating habits because your blood sugar (glucose) levels are greatly affected by what you eat and drink. Eating healthy foods in the appropriate amounts, at about the same times every day, can help you: Control your blood glucose. Lower your risk of heart disease. Improve your blood pressure. Reach or maintain a healthy weight. Every person with diabetes is different, and each person has different needs for a meal plan. Your health care provider may recommend that you work with a diet and nutrition specialist (dietitian) to make a meal plan that is best for you. Your meal plan may vary depending on factors such as: The calories you need. The medicines you take. Your weight. Your blood glucose, blood pressure, and cholesterol  levels. Your activity level. Other health conditions you have, such as heart or kidney disease. How do carbohydrates affect me? Carbohydrates, also called carbs, affect your blood glucose level more than any other type of food. Eating carbs naturally raises the amount of glucose in your blood. Carb counting is a method for keeping track of how many carbs you eat. Counting carbs is important to keep your blood glucose at a healthy level, especially if you use insulin or take certain oral diabetes medicines. It is important to know how many carbs you can safely have in  each meal. This is different for every person. Your dietitian can help you calculate how many carbs you should have at each meal and for each snack. Foods that contain carbs include: Bread, cereal, rice, pasta, and crackers. Potatoes and corn. Peas, beans, and lentils. Milk and yogurt. Fruit and juice. Desserts, such as cakes, cookies, ice cream, and candy. How does alcohol affect me? Alcohol can cause a sudden decrease in blood glucose (hypoglycemia), especially if you use insulin or take certain oral diabetes medicines. Hypoglycemia can be a life-threatening condition. Symptoms of hypoglycemia (sleepiness, dizziness, and confusion) are similar to symptoms of having too much alcohol. If your health care provider says that alcohol is safe for you, follow these guidelines: Limit alcohol intake to no more than 1 drink per day for nonpregnant women and 2 drinks per day for men. One drink equals 12 oz of beer, 5 oz of wine, or 1 oz of hard liquor. Do not drink on an empty stomach. Keep yourself hydrated with water, diet soda, or unsweetened iced tea. Keep in mind that regular soda, juice, and other mixers may contain a lot of sugar and must be counted as carbs. What are tips for following this plan?  Reading food labels Start by checking the serving size on the "Nutrition Facts" label of packaged foods and drinks. The amount of  calories, carbs, fats, and other nutrients listed on the label is based on one serving of the item. Many items contain more than one serving per package. Check the total grams (g) of carbs in one serving. You can calculate the number of servings of carbs in one serving by dividing the total carbs by 15. For example, if a food has 30 g of total carbs, it would be equal to 2 servings of carbs. Check the number of grams (g) of saturated and trans fats in one serving. Choose foods that have low or no amount of these fats. Check the number of milligrams (mg) of salt (sodium) in one serving. Most people should limit total sodium intake to less than 2,300 mg per day. Always check the nutrition information of foods labeled as "low-fat" or "nonfat". These foods may be higher in added sugar or refined carbs and should be avoided. Talk to your dietitian to identify your daily goals for nutrients listed on the label. Shopping Avoid buying canned, premade, or processed foods. These foods tend to be high in fat, sodium, and added sugar. Shop around the outside edge of the grocery store. This includes fresh fruits and vegetables, bulk grains, fresh meats, and fresh dairy. Cooking Use low-heat cooking methods, such as baking, instead of high-heat cooking methods like deep frying. Cook using healthy oils, such as olive, canola, or sunflower oil. Avoid cooking with butter, cream, or high-fat meats. Meal planning Eat meals and snacks regularly, preferably at the same times every day. Avoid going long periods of time without eating. Eat foods high in fiber, such as fresh fruits, vegetables, beans, and whole grains. Talk to your dietitian about how many servings of carbs you can eat at each meal. Eat 4-6 ounces (oz) of lean protein each day, such as lean meat, chicken, fish, eggs, or tofu. One oz of lean protein is equal to: 1 oz of meat, chicken, or fish. 1 egg.  cup of tofu. Eat some foods each day that contain  healthy fats, such as avocado, nuts, seeds, and fish. Lifestyle Check your blood glucose regularly. Exercise regularly as told by your health care  provider. This may include: 150 minutes of moderate-intensity or vigorous-intensity exercise each week. This could be brisk walking, biking, or water aerobics. Stretching and doing strength exercises, such as yoga or weightlifting, at least 2 times a week. Take medicines as told by your health care provider. Do not use any products that contain nicotine or tobacco, such as cigarettes and e-cigarettes. If you need help quitting, ask your health care provider. Work with a Social worker or diabetes educator to identify strategies to manage stress and any emotional and social challenges. Questions to ask a health care provider Do I need to meet with a diabetes educator? Do I need to meet with a dietitian? What number can I call if I have questions? When are the best times to check my blood glucose? Where to find more information: American Diabetes Association: diabetes.org Academy of Nutrition and Dietetics: www.eatright.[employer] of Diabetes and Digestive and Kidney Diseases (NIH): [employer] Summary A healthy meal plan will help you control your blood glucose and maintain a healthy lifestyle. Working with a diet and nutrition specialist (dietitian) can help you make a meal plan that is best for you. Keep in mind that carbohydrates (carbs) and alcohol have immediate effects on your blood glucose levels. It is important to count carbs and to use alcohol carefully. This information is not intended to replace advice given to you by your health care provider. Make sure you discuss any questions you have with your health care provider. Document Revised: 01/26/2017 Document Reviewed: 03/20/2016 Elsevier Patient Education  2020 [employer].

## 2019-03-24 ENCOUNTER — Other Ambulatory Visit: Payer: Self-pay

## 2019-03-24 ENCOUNTER — Encounter: Payer: Self-pay | Admitting: Nurse Practitioner

## 2019-03-24 ENCOUNTER — Ambulatory Visit (INDEPENDENT_AMBULATORY_CARE_PROVIDER_SITE_OTHER): Payer: Medicare HMO | Admitting: Nurse Practitioner

## 2019-03-24 VITALS — BP 143/69 | HR 74 | Temp 98.1°F

## 2019-03-24 DIAGNOSIS — Z794 Long term (current) use of insulin: Secondary | ICD-10-CM

## 2019-03-24 DIAGNOSIS — Z23 Encounter for immunization: Secondary | ICD-10-CM

## 2019-03-24 DIAGNOSIS — E1165 Type 2 diabetes mellitus with hyperglycemia: Secondary | ICD-10-CM | POA: Diagnosis not present

## 2019-03-24 NOTE — Patient Instructions (Addendum)
Go up by 2 units every 3 days to get closer to goal fasting morning sugar of less than 130.  If BS <70 or >400 let me know right away!!  Pneumococcal Polysaccharide Vaccine (PPSV23): What You Need to Know 1. Why get vaccinated? Pneumococcal polysaccharide vaccine (PPSV23) can prevent pneumococcal disease. Pneumococcal disease refers to any illness caused by pneumococcal bacteria. These bacteria can cause many types of illnesses, including pneumonia, which is an infection of the lungs. Pneumococcal bacteria are one of the most common causes of pneumonia. Besides pneumonia, pneumococcal bacteria can also cause:  Ear infections  Sinus infections  Meningitis (infection of the tissue covering the brain and spinal cord)  Bacteremia (bloodstream infection) Anyone can get pneumococcal disease, but children under 75 years of age, people with certain medical conditions, adults 68 years or older, and cigarette smokers are at the highest risk. Most pneumococcal infections are mild. However, some can result in long-term problems, such as brain damage or hearing loss. Meningitis, bacteremia, and pneumonia caused by pneumococcal disease can be fatal. 2. PPSV23 PPSV23 protects against 23 types of bacteria that cause pneumococcal disease. PPSV23 is recommended for:  All adults 65 years or older,  Anyone 2 years or older with certain medical conditions that can lead to an increased risk for pneumococcal disease. Most people need only one dose of PPSV23. A second dose of PPSV23, and another type of pneumococcal vaccine called PCV13, are recommended for certain high-risk groups. Your health care provider can give you more information. People 65 years or older should get a dose of PPSV23 even if they have already gotten one or more doses of the vaccine before they turned 75. 3. Talk with your health care provider Tell your vaccine provider if the person getting the vaccine:  Has had an allergic reaction after  a previous dose of PPSV23, or has any severe, life-threatening allergies. In some cases, your health care provider may decide to postpone PPSV23 vaccination to a future visit. People with minor illnesses, such as a cold, may be vaccinated. People who are moderately or severely ill should usually wait until they recover before getting PPSV23. Your health care provider can give you more information. 4. Risks of a vaccine reaction  Redness or pain where the shot is given, feeling tired, fever, or muscle aches can happen after PPSV23. People sometimes faint after medical procedures, including vaccination. Tell your provider if you feel dizzy or have vision changes or ringing in the ears. As with any medicine, there is a very remote chance of a vaccine causing a severe allergic reaction, other serious injury, or death. 5. What if there is a serious problem? An allergic reaction could occur after the vaccinated person leaves the clinic. If you see signs of a severe allergic reaction (hives, swelling of the face and throat, difficulty breathing, a fast heartbeat, dizziness, or weakness), call 9-1-1 and get the person to the nearest hospital. For other signs that concern you, call your health care provider. Adverse reactions should be reported to the Vaccine Adverse Event Reporting System (VAERS). Your health care provider will usually file this report, or you can do it yourself. Visit the VAERS website at www.vaers.SamedayNews.es or call (720)101-8789. VAERS is only for reporting reactions, and VAERS staff do not give medical advice. 6. How can I learn more?  Ask your health care provider.  Call your local or state health department.  Contact the Centers for Disease Control and Prevention (CDC): ? Call 570-073-3111 (1-800-CDC-INFO) or ?  Visit CDC's website at http://hunter.com/ CDC Vaccine Information Statement PPSV23 Vaccine (12/26/2017) This information is not intended to replace advice given to you  by your health care provider. Make sure you discuss any questions you have with your health care provider. Document Revised: 06/04/2018 Document Reviewed: 09/25/2017 Elsevier Patient Education  [place].

## 2019-03-24 NOTE — Assessment & Plan Note (Addendum)
Chronic, ongoing with improvement in BS and symptoms.  Tolerating new insulin well.  Continue to have fasting BS above goal, have recommended increasing by 2 units every evening every 3 days if fasting morning BS >130.  Discussed at length BS goals with him.  Continue Metformin.   Goal in long run is to lower A1C and sugars with transition off insulin in future and move towards GLP or SGLT.  Continue collaboration with CCM team.  Recheck BMP today.  Return in 4 weeks for follow-up or sooner if elevation in BS.

## 2019-03-24 NOTE — Progress Notes (Signed)
BP (!) 143/69 (BP Location: Left Arm, Patient Position: Sitting, Cuff Size: Normal)   Pulse 74   Temp 98.1 F (36.7 C) (Oral)   SpO2 97%    Subjective:    Patient ID: Matthew [name], male    DOB: 10-26-1945, 74 y.o.   MRN: [id number]  HPI: Matthew [name] is a 74 y.o. male  Chief Complaint  Patient presents with  . Diabetes   DIABETES Started on Levemir 03/19/2019 due to A1C symptomatic at >14.  Increased to 12 units 2 days ago as recommended.  Has been less tired with insulin.  Continues to urinate frequently, but reports this is getting better (sleeping longer).  No further dry mouth.   Hypoglycemic episodes:no Polydipsia/polyuria: yes Visual disturbance: no Chest pain: no Paresthesias: no Glucose Monitoring: yes  Accucheck frequency: Daily  Fasting glucose: 207 to 344, they are coming down into 200's over past few days  Post prandial:  Evening:  Before meals: Taking Insulin?: yes  Long acting insulin: Levemir 12 units  Short acting insulin: Blood Pressure Monitoring: not checking Retinal Examination: Up to Date Foot Exam: Up to Date Pneumovax: Up to Date Influenza: Up to Date Aspirin: yes  Relevant past medical, surgical, family and social history reviewed and updated as indicated. Interim medical history since our last visit reviewed. Allergies and medications reviewed and updated.  Review of Systems  Constitutional: Negative for activity change, diaphoresis, fatigue and fever.  Respiratory: Negative for cough, chest tightness, shortness of breath and wheezing.   Cardiovascular: Negative for chest pain, palpitations and leg swelling.  Gastrointestinal: Negative.   Endocrine: Negative for cold intolerance, heat intolerance, polydipsia, polyphagia and polyuria.  Neurological: Negative.   Psychiatric/Behavioral: Negative.     Per HPI unless specifically indicated above     Objective:    BP (!) 143/69 (BP Location: Left Arm, Patient Position: Sitting, Cuff  Size: Normal)   Pulse 74   Temp 98.1 F (36.7 C) (Oral)   SpO2 97%   Wt Readings from Last 3 Encounters:  08/12/18 224 lb (101.6 kg)  08/08/17 211 lb (95.7 kg)  08/08/17 211 lb (95.7 kg)    Physical Exam Vitals and nursing note reviewed.  Constitutional:      General: He is awake. He is not in acute distress.    Appearance: He is well-developed and overweight. He is not ill-appearing.  HENT:     Head: Normocephalic and atraumatic.     Right Ear: Hearing normal. No drainage.     Left Ear: Hearing normal. No drainage.  Eyes:     General: Lids are normal.        Right eye: No discharge.        Left eye: No discharge.     Conjunctiva/sclera: Conjunctivae normal.     Pupils: Pupils are equal, round, and reactive to light.  Neck:     Vascular: No carotid bruit.  Cardiovascular:     Rate and Rhythm: Normal rate and regular rhythm.     Heart sounds: Normal heart sounds, S1 normal and S2 normal. No murmur. No gallop.   Pulmonary:     Effort: Pulmonary effort is normal. No accessory muscle usage or respiratory distress.     Breath sounds: Normal breath sounds.  Abdominal:     General: Bowel sounds are normal.     Palpations: Abdomen is soft.     Tenderness: There is no abdominal tenderness.  Musculoskeletal:        General: Normal  range of motion.     Cervical back: Normal range of motion and neck supple.     Right lower leg: No edema.     Left lower leg: No edema.  Skin:    General: Skin is warm and dry.     Capillary Refill: Capillary refill takes less than 2 seconds.  Neurological:     Mental Status: He is alert and oriented to person, place, and time.  Psychiatric:        Attention and Perception: Attention normal.        Mood and Affect: Mood normal.        Speech: Speech normal.        Behavior: Behavior normal. Behavior is cooperative.    Results for orders placed or performed in visit on 03/19/19  Glucose Hemocue Waived  Result Value Ref Range   Glu Hemocue  Waived 219 (H) 65 - 99 mg/dL  Microalbumin, Urine Waived  Result Value Ref Range   Microalb, Ur Waived 10 0 - 19 mg/L   Creatinine, Urine Waived 10 10 - 300 mg/dL   Microalb/Creat Ratio 30-300 (H) <30 mg/g  Bayer DCA Hb A1c Waived  Result Value Ref Range   HB A1C (BAYER DCA - WAIVED) >14.0 (H) <7.0 %  Lipid Panel Piccolo, Waived  Result Value Ref Range   Cholesterol Piccolo, Waived 150 <200 mg/dL   HDL Chol Piccolo, Waived 46 (L) >59 mg/dL   Triglycerides Piccolo,Waived 224 (H) <150 mg/dL   Chol/HDL Ratio Piccolo,Waive 3.2 mg/dL   LDL Chol Calc Piccolo Waived 59 <100 mg/dL   VLDL Chol Calc Piccolo,Waive 45 (H) <30 mg/dL  Comprehensive metabolic panel  Result Value Ref Range   Glucose 287 (H) 65 - 99 mg/dL   BUN 20 8 - 27 mg/dL   Creatinine, Ser 1.57 (H) 0.76 - 1.27 mg/dL   GFR calc non Af Amer 44 (L) >59 mL/min/1.73   GFR calc Af Amer 51 (L) >59 mL/min/1.73   BUN/Creatinine Ratio 13 10 - 24   Sodium 131 (L) 134 - 144 mmol/L   Potassium 4.3 3.5 - 5.2 mmol/L   Chloride 93 (L) 96 - 106 mmol/L   CO2 25 20 - 29 mmol/L   Calcium 9.7 8.6 - 10.2 mg/dL   Total Protein 6.2 6.0 - 8.5 g/dL   Albumin 4.3 3.7 - 4.7 g/dL   Globulin, Total 1.9 1.5 - 4.5 g/dL   Albumin/Globulin Ratio 2.3 (H) 1.2 - 2.2   Bilirubin Total 0.4 0.0 - 1.2 mg/dL   Alkaline Phosphatase 84 39 - 117 IU/L   AST 16 0 - 40 IU/L   ALT 23 0 - 44 IU/L      Assessment & Plan:   Problem List Items Addressed This Visit      Endocrine   Type 2 diabetes mellitus with hyperglycemia (HCC) - Primary    Chronic, ongoing with improvement in BS and symptoms.  Tolerating new insulin well.  Continue to have fasting BS above goal, have recommended increasing by 2 units every evening every 3 days if fasting morning BS >130.  Discussed at length BS goals with him.  Continue Metformin.   Goal in long run is to lower A1C and sugars with transition off insulin in future and move towards GLP or SGLT.  Continue collaboration with CCM team.   Recheck BMP today.  Return in 4 weeks for follow-up or sooner if elevation in BS.        Relevant Orders  Basic Metabolic Panel (BMET)       Follow up plan: Return in about 4 weeks (around 04/21/2019) for T2DM.

## 2019-03-25 LAB — BASIC METABOLIC PANEL
BUN/Creatinine Ratio: 18 (ref 10–24)
BUN: 24 mg/dL (ref 8–27)
CO2: 23 mmol/L (ref 20–29)
Calcium: 9.4 mg/dL (ref 8.6–10.2)
Chloride: 99 mmol/L (ref 96–106)
Creatinine, Ser: 1.36 mg/dL — ABNORMAL HIGH (ref 0.76–1.27)
GFR calc Af Amer: 60 mL/min/{1.73_m2} (ref >59)
GFR calc non Af Amer: 52 mL/min/{1.73_m2} — ABNORMAL LOW (ref >59)
Glucose: 192 mg/dL — ABNORMAL HIGH (ref 65–99)
Potassium: 4.2 mmol/L (ref 3.5–5.2)
Sodium: 135 mmol/L (ref 134–144)

## 2019-03-25 NOTE — Progress Notes (Signed)
Please let Matthew [name] know his kidney function still shows some mild kidney disease, but is improving and glucose on these labs is coming down.  Had been 287 on last labs and this lab it is 192.  So we are going in the right direction.  Keep up the good work and I am here if you need me.  Have a great day!!

## 2019-03-27 ENCOUNTER — Encounter: Payer: Medicare HMO | Admitting: Nurse Practitioner

## 2019-04-23 ENCOUNTER — Ambulatory Visit: Payer: Self-pay | Admitting: Pharmacist

## 2019-04-23 ENCOUNTER — Ambulatory Visit (INDEPENDENT_AMBULATORY_CARE_PROVIDER_SITE_OTHER): Payer: Medicare HMO | Admitting: General Practice

## 2019-04-23 ENCOUNTER — Telehealth: Payer: Self-pay | Admitting: General Practice

## 2019-04-23 ENCOUNTER — Encounter: Payer: Self-pay | Admitting: Nurse Practitioner

## 2019-04-23 ENCOUNTER — Other Ambulatory Visit: Payer: Self-pay

## 2019-04-23 ENCOUNTER — Ambulatory Visit (INDEPENDENT_AMBULATORY_CARE_PROVIDER_SITE_OTHER): Payer: Medicare HMO | Admitting: Nurse Practitioner

## 2019-04-23 VITALS — BP 117/68 | HR 65 | Temp 98.1°F

## 2019-04-23 DIAGNOSIS — E1165 Type 2 diabetes mellitus with hyperglycemia: Secondary | ICD-10-CM

## 2019-04-23 DIAGNOSIS — I10 Essential (primary) hypertension: Secondary | ICD-10-CM

## 2019-04-23 DIAGNOSIS — E1159 Type 2 diabetes mellitus with other circulatory complications: Secondary | ICD-10-CM

## 2019-04-23 DIAGNOSIS — Z794 Long term (current) use of insulin: Secondary | ICD-10-CM

## 2019-04-23 DIAGNOSIS — E1169 Type 2 diabetes mellitus with other specified complication: Secondary | ICD-10-CM

## 2019-04-23 DIAGNOSIS — E785 Hyperlipidemia, unspecified: Secondary | ICD-10-CM | POA: Diagnosis not present

## 2019-04-23 DIAGNOSIS — I152 Hypertension secondary to endocrine disorders: Secondary | ICD-10-CM

## 2019-04-23 NOTE — Patient Instructions (Signed)
Carbohydrate Counting for Diabetes Mellitus, Adult  Carbohydrate counting is a method of keeping track of how many carbohydrates you eat. Eating carbohydrates naturally increases the amount of sugar (glucose) in the blood. Counting how many carbohydrates you eat helps keep your blood glucose within normal limits, which helps you manage your diabetes (diabetes mellitus). It is important to know how many carbohydrates you can safely have in each meal. This is different for every person. A diet and nutrition specialist (registered dietitian) can help you make a meal plan and calculate how many carbohydrates you should have at each meal and snack. Carbohydrates are found in the following foods:  Grains, such as breads and cereals.  Dried beans and soy products.  Starchy vegetables, such as potatoes, peas, and corn.  Fruit and fruit juices.  Milk and yogurt.  Sweets and snack foods, such as cake, cookies, candy, chips, and soft drinks. How do I count carbohydrates? There are two ways to count carbohydrates in food. You can use either of the methods or a combination of both. Reading "Nutrition Facts" on packaged food The "Nutrition Facts" list is included on the labels of almost all packaged foods and beverages in the [place] It includes:  The serving size.  Information about nutrients in each serving, including the grams (g) of carbohydrate per serving. To use the "Nutrition Facts":  Decide how many servings you will have.  Multiply the number of servings by the number of carbohydrates per serving.  The resulting number is the total amount of carbohydrates that you will be having. Learning standard serving sizes of other foods When you eat carbohydrate foods that are not packaged or do not include "Nutrition Facts" on the label, you need to measure the servings in order to count the amount of carbohydrates:  Measure the foods that you will eat with a food scale or measuring cup, if  needed.  Decide how many standard-size servings you will eat.  Multiply the number of servings by 15. Most carbohydrate-rich foods have about 15 g of carbohydrates per serving. ? For example, if you eat 8 oz (170 g) of strawberries, you will have eaten 2 servings and 30 g of carbohydrates (2 servings x 15 g = 30 g).  For foods that have more than one food mixed, such as soups and casseroles, you must count the carbohydrates in each food that is included. The following list contains standard serving sizes of common carbohydrate-rich foods. Each of these servings has about 15 g of carbohydrates:   hamburger bun or  English muffin.   oz (15 mL) syrup.   oz (14 g) jelly.  1 slice of bread.  1 six-inch tortilla.  3 oz (85 g) cooked rice or pasta.  4 oz (113 g) cooked dried beans.  4 oz (113 g) starchy vegetable, such as peas, corn, or potatoes.  4 oz (113 g) hot cereal.  4 oz (113 g) mashed potatoes or  of a large baked potato.  4 oz (113 g) canned or frozen fruit.  4 oz (120 mL) fruit juice.  4-6 crackers.  6 chicken nuggets.  6 oz (170 g) unsweetened dry cereal.  6 oz (170 g) plain fat-free yogurt or yogurt sweetened with artificial sweeteners.  8 oz (240 mL) milk.  8 oz (170 g) fresh fruit or one small piece of fruit.  24 oz (680 g) popped popcorn. Example of carbohydrate counting Sample meal  3 oz (85 g) chicken breast.  6 oz (170 g)   brown rice.  4 oz (113 g) corn.  8 oz (240 mL) milk.  8 oz (170 g) strawberries with sugar-free whipped topping. Carbohydrate calculation 1. Identify the foods that contain carbohydrates: ? Rice. ? Corn. ? Milk. ? Strawberries. 2. Calculate how many servings you have of each food: ? 2 servings rice. ? 1 serving corn. ? 1 serving milk. ? 1 serving strawberries. 3. Multiply each number of servings by 15 g: ? 2 servings rice x 15 g = 30 g. ? 1 serving corn x 15 g = 15 g. ? 1 serving milk x 15 g = 15 g. ? 1  serving strawberries x 15 g = 15 g. 4. Add together all of the amounts to find the total grams of carbohydrates eaten: ? 30 g + 15 g + 15 g + 15 g = 75 g of carbohydrates total. Summary  Carbohydrate counting is a method of keeping track of how many carbohydrates you eat.  Eating carbohydrates naturally increases the amount of sugar (glucose) in the blood.  Counting how many carbohydrates you eat helps keep your blood glucose within normal limits, which helps you manage your diabetes.  A diet and nutrition specialist (registered dietitian) can help you make a meal plan and calculate how many carbohydrates you should have at each meal and snack. This information is not intended to replace advice given to you by your health care provider. Make sure you discuss any questions you have with your health care provider. Document Revised: 09/07/2016 Document Reviewed: 07/28/2015 Elsevier Patient Education  2020 [employer]  

## 2019-04-23 NOTE — Patient Instructions (Addendum)
Visit Information  Goals Addressed            This Visit's Progress   . RNCM: I need help with getting my Diabetes under control       Current Barriers:  . Chronic Disease Management support and education needs related to DM, HLD, and HTN  Nurse Case Manager Clinical Goal(s):  Marland Kitchen Over the next 90 days, patient will verbalize understanding of plan for DM, HLD, and HTN management  . Over the next 90 days days, patient will demonstrate a decrease in hyperglycemic exacerbations as evidenced by decreased blood glucose levels and Hemoglobin A1C 14 . Over the next 90 days, patient will attend all scheduled medical appointments: next appointment with pcp on 04-23-2019 . Over the next 90 days, patient will demonstrate improved adherence to prescribed treatment plan for DM as evidenced by improved signs and symptoms of hyperglycemia, blood sugar levels decreasing <200's, improved Hemoglobin A1C . Over the next 90 days, patient will demonstrate improved health management independence as evidenced by following a heart healthy/ADA Carb modified diet . Over the next 90 days, patient will demonstrate understanding of rationale for each prescribed medication as evidenced by taking medications as prescribed and talking to the pcp before adjusting insulin doses  Interventions:  . Evaluation of current treatment plan related to DM, HLD, and HTN and patient's adherence to plan as established by provider. . Advised patient to call pcp for Blood sugar readings <90 or >400 . Provided education to patient re: ADA/ carb modified diet; the patient states he has cut out all sweets. He is trying to watch carbohydrates.  He is not sure if he has lost weight or not but feels great . Reviewed medications with the patient- co-visit with the CCM pharmacist,  The patient to talk to pcp about the high dose of ASA he is taking daily . Co-Visit with pharmacist via telephone today for review and assistance with Chronic Disease  management and care . Discussed plans with patient for ongoing care management follow up and provided patient with direct contact information for care management team . Provided patient and/or caregiver with COVID19 information about community resources- per the patient he will receive his first COVID19 vaccine on 04-24-2019 . Provided patient with foot care educational materials related to slower wound healing in patients with DM and also other complication related to other chronic disease processes of HLD and HTN . Advised patient, providing education and rationale, to check cbg three times a day and record, calling pcp for findings outside established parameters.   . Provided the patient with samples of Glucerna with coupons for pick up today at the office  Patient Self Care Activities:  . Patient verbalizes understanding of plan to work with the interdisciplinary team to improve diabetes management and other chronic conditions of HLD and HTN . Self administers medications as prescribed . Attends all scheduled provider appointments . Unable to independently manage Chronic disease processes of DM, HLD, and HTN  Please see past updates related to this goal by clicking on the "Past Updates" button in the selected goal         Print copy of patient instructions provided.   The care management team will reach out to the patient again over the next 60 days.   Noreene Larsson RN, MSN, [facility] Mobile: [phone]   Foot Care, Adult Foot care is an important part of your health. Noticing and  addressing any potential problems early is the best way to prevent future foot problems. How to care for your [place] your feet daily with warm water and mild soap. Do not use hot water. Then, pat your feet and the areas between your toes until they are completely dry. Do not soak your feet as this can dry  your skin.  Trim your toenails straight across. Do not dig under them or around the cuticle. File the edges of your nails with an emery board or nail file.  On the skin on your feet and on dry, brittle nails, apply a moisturizing lotion or petroleum jelly that is unscented and does not contain alcohol. Do not apply lotion between your toes. Shoes and Socks   Wear clean socks or stockings every day. Make sure they are not too tight.  Wear shoes that fit properly and have enough cushioning. To break in new shoes, wear them for just a few hours a day. This prevents you from injuring your feet. Always look in your shoes before you put them on to be sure there are no objects inside. Wounds, Scrapes, Corns, and Calluses  Check your feet daily for blisters, cuts, and redness. If you cannot see the bottom of your feet, use a mirror or ask someone for help.  Do not cut corns or calluses. Do not try to remove them with medicine.  If you find a minor scrape, cut, or break in the skin on your feet, keep it and the skin around it clean and dry. These areas may be cleaned with mild soap and water. Do not clean the area with peroxide, alcohol, or iodine.  If you have a wound, scrape, corn, or callus on your foot, look at it several times a day to make sure it is healing and is not infected. Check for: ? More redness, swelling, or pain. ? More fluid or blood. ? Warmth. ? Pus or a bad smell. General Instructions  Do not cross your legs. That may decrease the blood flow to your feet.  Do not use heating pads or hot water bottles on your feet. They may burn your skin. If you have lost feeling in your feet or legs, you may not know it is happening until it is too late.  Make sure your health care provider does a complete foot exam at least annually or more often if you have foot problems. If you have foot problems, report any cuts, sores, or bruises to your health care provider immediately. Contact a  health care provider if:  You have a medical condition that increases your risk of infection and you have any cuts, sores, or bruises on your feet.  You have an injury that is not healing.  You notice redness on your legs or feet.  You feel burning or tingling in your legs or feet.  You have pain or cramps in your legs or feet.  Your legs or feet are numb.  Your feet always feel cold.  You have pain around a toenail. Get help right away if:  You have a wound, scrape, corn, or callus on your foot and: ? You have more redness, swelling, or pain. ? You have more fluid or blood. ? Your wound, scrape, corn, or callus feels warm to the touch. ? You have pus or a bad smell coming from the wound, scrape, corn, or callus. ? You have a fever.  You have a red line going up  your leg. This information is not intended to replace advice given to you by your health care provider. Make sure you discuss any questions you have with your health care provider. Document Revised: 03/02/2016 Document Reviewed: 07/23/2015 Elsevier Patient Education  [place].  Hemoglobin A1c Test Why am I having this test? You may have the hemoglobin A1c test (HbA1c test) done to:  Evaluate your risk for developing diabetes (diabetes mellitus).  Diagnose diabetes.  Monitor long-term control of blood sugar (glucose) in people who have diabetes and help make treatment decisions. This test may be done with other blood glucose tests, such as fasting blood glucose and oral glucose tolerance tests. What is being tested? Hemoglobin is a type of protein in the blood that carries oxygen. Glucose attaches to hemoglobin to form glycated hemoglobin. This test checks the amount of glycated hemoglobin in your blood, which is a good indicator of the average amount of glucose in your blood during the past 2-3 months. What kind of sample is taken?  A blood sample is required for this test. It is usually collected by  inserting a needle into a blood vessel. Tell a health care provider about:  All medicines you are taking, including vitamins, herbs, eye drops, creams, and over-the-counter medicines.  Any blood disorders you have.  Any surgeries you have had.  Any medical conditions you have.  Whether you are pregnant or may be pregnant. How are the results reported? Your results will be reported as a percentage that indicates how much of your hemoglobin has glucose attached to it (is glycated). Your health care provider will compare your results to normal ranges that were established after testing a large group of people (reference ranges). Reference ranges may vary among labs and hospitals. For this test, common reference ranges are:  Adult or child without diabetes: 4-5.6%.  Adult or child with diabetes and good blood glucose control: less than 7%. What do the results mean? If you have diabetes:  A result of less than 7% is considered normal, meaning that your blood glucose is well controlled.  A result higher than 7% means that your blood glucose is not well controlled, and your treatment plan may need to be adjusted. If you do not have diabetes:  A result within the reference range is considered normal, meaning that you are not at high risk for diabetes.  A result of 5.7-6.4% means that you have a high risk of developing diabetes, and you may have prediabetes. Prediabetes is the condition of having a blood glucose level that is higher than it should be, but not high enough for you to be diagnosed with diabetes. Having prediabetes puts you at risk for developing type 2 diabetes (type 2 diabetes mellitus). You may have more tests, including a repeat HbA1c test.  Results of 6.5% or higher on two separate HbA1c tests mean that you have diabetes. You may have more tests to confirm the diagnosis. Abnormally low HbA1c values may be caused by:  Pregnancy.  Severe blood loss.  Receiving donated  blood (transfusions).  Low red blood cell count (anemia).  Long-term kidney failure.  Some unusual forms (variants) of hemoglobin. Talk with your health care provider about what your results mean. Questions to ask your health care provider Ask your health care provider, or the department that is doing the test:  When will my results be ready?  How will I get my results?  What are my treatment options?  What other tests do  I need?  What are my next steps? Summary  The hemoglobin A1c test (HbA1c test) may be done to evaluate your risk for developing diabetes, to diagnose diabetes, and to monitor long-term control of blood sugar (glucose) in people who have diabetes and help make treatment decisions.  Hemoglobin is a type of protein in the blood that carries oxygen. Glucose attaches to hemoglobin to form glycated hemoglobin. This test checks the amount of glycated hemoglobin in your blood, which is a good indicator of the average amount of glucose in your blood during the past 2-3 months.  Talk with your health care provider about what your results mean. This information is not intended to replace advice given to you by your health care provider. Make sure you discuss any questions you have with your health care provider. Document Revised: 01/26/2017 Document Reviewed: 09/26/2016 Elsevier Patient Education  [place].  Diabetes Mellitus and Nutrition, Adult When you have diabetes (diabetes mellitus), it is very important to have healthy eating habits because your blood sugar (glucose) levels are greatly affected by what you eat and drink. Eating healthy foods in the appropriate amounts, at about the same times every day, can help you:  Control your blood glucose.  Lower your risk of heart disease.  Improve your blood pressure.  Reach or maintain a healthy weight. Every person with diabetes is different, and each person has different needs for a meal plan. Your health  care provider may recommend that you work with a diet and nutrition specialist (dietitian) to make a meal plan that is best for you. Your meal plan may vary depending on factors such as:  The calories you need.  The medicines you take.  Your weight.  Your blood glucose, blood pressure, and cholesterol levels.  Your activity level.  Other health conditions you have, such as heart or kidney disease. How do carbohydrates affect me? Carbohydrates, also called carbs, affect your blood glucose level more than any other type of food. Eating carbs naturally raises the amount of glucose in your blood. Carb counting is a method for keeping track of how many carbs you eat. Counting carbs is important to keep your blood glucose at a healthy level, especially if you use insulin or take certain oral diabetes medicines. It is important to know how many carbs you can safely have in each meal. This is different for every person. Your dietitian can help you calculate how many carbs you should have at each meal and for each snack. Foods that contain carbs include:  Bread, cereal, rice, pasta, and crackers.  Potatoes and corn.  Peas, beans, and lentils.  Milk and yogurt.  Fruit and juice.  Desserts, such as cakes, cookies, ice cream, and candy. How does alcohol affect me? Alcohol can cause a sudden decrease in blood glucose (hypoglycemia), especially if you use insulin or take certain oral diabetes medicines. Hypoglycemia can be a life-threatening condition. Symptoms of hypoglycemia (sleepiness, dizziness, and confusion) are similar to symptoms of having too much alcohol. If your health care provider says that alcohol is safe for you, follow these guidelines:  Limit alcohol intake to no more than 1 drink per day for nonpregnant women and 2 drinks per day for men. One drink equals 12 oz of beer, 5 oz of wine, or 1 oz of hard liquor.  Do not drink on an empty stomach.  Keep yourself hydrated with  water, diet soda, or unsweetened iced tea.  Keep in mind that regular soda,  juice, and other mixers may contain a lot of sugar and must be counted as carbs. What are tips for following this plan?  Reading food labels  Start by checking the serving size on the "Nutrition Facts" label of packaged foods and drinks. The amount of calories, carbs, fats, and other nutrients listed on the label is based on one serving of the item. Many items contain more than one serving per package.  Check the total grams (g) of carbs in one serving. You can calculate the number of servings of carbs in one serving by dividing the total carbs by 15. For example, if a food has 30 g of total carbs, it would be equal to 2 servings of carbs.  Check the number of grams (g) of saturated and trans fats in one serving. Choose foods that have low or no amount of these fats.  Check the number of milligrams (mg) of salt (sodium) in one serving. Most people should limit total sodium intake to less than 2,300 mg per day.  Always check the nutrition information of foods labeled as "low-fat" or "nonfat". These foods may be higher in added sugar or refined carbs and should be avoided.  Talk to your dietitian to identify your daily goals for nutrients listed on the label. Shopping  Avoid buying canned, premade, or processed foods. These foods tend to be high in fat, sodium, and added sugar.  Shop around the outside edge of the grocery store. This includes fresh fruits and vegetables, bulk grains, fresh meats, and fresh dairy. Cooking  Use low-heat cooking methods, such as baking, instead of high-heat cooking methods like deep frying.  Cook using healthy oils, such as olive, canola, or sunflower oil.  Avoid cooking with butter, cream, or high-fat meats. Meal planning  Eat meals and snacks regularly, preferably at the same times every day. Avoid going long periods of time without eating.  Eat foods high in fiber, such as  fresh fruits, vegetables, beans, and whole grains. Talk to your dietitian about how many servings of carbs you can eat at each meal.  Eat 4-6 ounces (oz) of lean protein each day, such as lean meat, chicken, fish, eggs, or tofu. One oz of lean protein is equal to: ? 1 oz of meat, chicken, or fish. ? 1 egg. ?  cup of tofu.  Eat some foods each day that contain healthy fats, such as avocado, nuts, seeds, and fish. Lifestyle  Check your blood glucose regularly.  Exercise regularly as told by your health care provider. This may include: ? 150 minutes of moderate-intensity or vigorous-intensity exercise each week. This could be brisk walking, biking, or water aerobics. ? Stretching and doing strength exercises, such as yoga or weightlifting, at least 2 times a week.  Take medicines as told by your health care provider.  Do not use any products that contain nicotine or tobacco, such as cigarettes and e-cigarettes. If you need help quitting, ask your health care provider.  Work with a Social worker or diabetes educator to identify strategies to manage stress and any emotional and social challenges. Questions to ask a health care provider  Do I need to meet with a diabetes educator?  Do I need to meet with a dietitian?  What number can I call if I have questions?  When are the best times to check my blood glucose? Where to find more information:  American Diabetes Association: diabetes.org  Academy of Nutrition and Dietetics: www.eatright.[employer] of Diabetes and  Digestive and Kidney Diseases (NIH): [employer] Summary  A healthy meal plan will help you control your blood glucose and maintain a healthy lifestyle.  Working with a diet and nutrition specialist (dietitian) can help you make a meal plan that is best for you.  Keep in mind that carbohydrates (carbs) and alcohol have immediate effects on your blood glucose levels. It is important to count carbs and to  use alcohol carefully. This information is not intended to replace advice given to you by your health care provider. Make sure you discuss any questions you have with your health care provider. Document Revised: 01/26/2017 Document Reviewed: 03/20/2016 Elsevier Patient Education  2020 [employer].

## 2019-04-23 NOTE — Progress Notes (Signed)
BP 117/68 (BP Location: Left Arm, Patient Position: Sitting, Cuff Size: Normal)   Pulse 65   Temp 98.1 F (36.7 C) (Oral)   SpO2 97%    Subjective:    Patient ID: Matthew [name], male    DOB: [birth year], 74 y.o.   MRN: [id number]  HPI: Matthew [name] is a 74 y.o. male  Chief Complaint  Patient presents with  . Diabetes   DIABETES Started on insulin in January due to A1C >14% with symptoms.  Has improved symptoms, is having less polyuria/polydipsia/polyphagia.  At baseline drinks a whole pot of coffee every morning, drinks Diet 7-Up and Pepsi Max, and water.   Hypoglycemic episodes:no Polydipsia/polyuria: improved Visual disturbance: no Chest pain: no Paresthesias: no Glucose Monitoring: yes  Accucheck frequency: BID  Fasting glucose: 110 to 180's  Post prandial: 127 before came in here today  Evening:  Before meals: Taking Insulin?: yes  Long acting insulin: Levemir 12 units  Short acting insulin: Blood Pressure Monitoring: not checking Retinal Examination: Up to Date Foot Exam: Up to Date Pneumovax: Up to Date Influenza: Up to Date Aspirin: yes -- taking 500 MG, instructed him to change to 81 MG ASA  Relevant past medical, surgical, family and social history reviewed and updated as indicated. Interim medical history since our last visit reviewed. Allergies and medications reviewed and updated.  Review of Systems  Constitutional: Negative for activity change, diaphoresis, fatigue and fever.  Respiratory: Negative for cough, chest tightness, shortness of breath and wheezing.   Cardiovascular: Negative for chest pain, palpitations and leg swelling.  Gastrointestinal: Negative.   Endocrine: Negative for polydipsia, polyphagia and polyuria.  Psychiatric/Behavioral: Negative.     Per HPI unless specifically indicated above     Objective:    BP 117/68 (BP Location: Left Arm, Patient Position: Sitting, Cuff Size: Normal)   Pulse 65   Temp 98.1 F (36.7 C)  (Oral)   SpO2 97%   Wt Readings from Last 3 Encounters:  08/12/18 224 lb (101.6 kg)  08/08/17 211 lb (95.7 kg)  08/08/17 211 lb (95.7 kg)    Physical Exam Vitals and nursing note reviewed.  Constitutional:      General: He is awake. He is not in acute distress.    Appearance: He is well-developed and overweight. He is not ill-appearing.  HENT:     Head: Normocephalic and atraumatic.     Right Ear: Hearing normal. No drainage.     Left Ear: Hearing normal. No drainage.  Eyes:     General: Lids are normal.        Right eye: No discharge.        Left eye: No discharge.     Conjunctiva/sclera: Conjunctivae normal.     Pupils: Pupils are equal, round, and reactive to light.  Neck:     Vascular: No carotid bruit.  Cardiovascular:     Rate and Rhythm: Normal rate and regular rhythm.     Heart sounds: Normal heart sounds, S1 normal and S2 normal. No murmur. No gallop.   Pulmonary:     Effort: Pulmonary effort is normal. No accessory muscle usage or respiratory distress.     Breath sounds: Normal breath sounds.  Abdominal:     General: Bowel sounds are normal.     Palpations: Abdomen is soft.     Tenderness: There is no abdominal tenderness.  Musculoskeletal:        General: Normal range of motion.     Cervical back:  Normal range of motion and neck supple.     Right lower leg: No edema.     Left lower leg: No edema.  Skin:    General: Skin is warm and dry.     Capillary Refill: Capillary refill takes less than 2 seconds.  Neurological:     Mental Status: He is alert and oriented to person, place, and time.  Psychiatric:        Attention and Perception: Attention normal.        Mood and Affect: Mood normal.        Speech: Speech normal.        Behavior: Behavior normal. Behavior is cooperative.     Results for orders placed or performed in visit on [id number]  Basic Metabolic Panel (BMET)  Result Value Ref Range   Glucose 192 (H) 65 - 99 mg/dL   BUN 24 8 - 27 mg/dL    Creatinine, Ser 1.36 (H) 0.76 - 1.27 mg/dL   GFR calc non Af Amer 52 (L) >59 mL/min/1.73   GFR calc Af Amer 60 >59 mL/min/1.73   BUN/Creatinine Ratio 18 10 - 24   Sodium 135 134 - 144 mmol/L   Potassium 4.2 3.5 - 5.2 mmol/L   Chloride 99 96 - 106 mmol/L   CO2 23 20 - 29 mmol/L   Calcium 9.4 8.6 - 10.2 mg/dL      Assessment & Plan:   Problem List Items Addressed This Visit      Cardiovascular and Mediastinum   Hypertension associated with diabetes ([place])    Chronic, stable with BP below goal.  Continue Benazepril for kidney protection and HTN.  Recommend he monitor BP at home at least 3 mornings a week.  Change ASA dose to 81 MG daily, not 500 MG.  Return in 2 months.        Endocrine   Type 2 diabetes mellitus with hyperglycemia ([facility]) - Primary    Chronic, ongoing with improvement in BS and symptoms.  Tolerating new insulin well.  BS improved with fasting 110-180.  Discussed at length BS goals with him.  Continue Metformin and Levemir 12 units.   Goal in long run is to lower A1C and sugars with transition off insulin in future and move towards GLP or SGLT.  Continue collaboration with CCM team.  Return in 2 months for A1C check.             Follow up plan: Return in about 2 months (around 06/21/2019) for T2DM, HTN/HLD.

## 2019-04-23 NOTE — Chronic Care Management (AMB) (Signed)
Chronic Care Management   Note  04/23/2019 Name: Matthew [name] MRN: [id number] DOB: 03-Jan-1946   Subjective:  Matthew [name] is a 74 y.o. year old male who is a primary care patient of Crissman, Jeannette How, MD. The CCM team was consulted for assistance with chronic disease management and care coordination needs.    Contacted patient w/ Noreene Larsson, RN CM, for medication management review.  Review of patient status, including review of consultants reports, laboratory and other test data, was performed as part of comprehensive evaluation and provision of chronic care management services.   SDOH (Social Determinants of Health) assessments and interventions performed:  SDOH Interventions     Most Recent Value  SDOH Interventions  SDOH Interventions for the Following Domains  Physical Activity, Financial Strain  Financial Strain Interventions  Other (Comment) [Reviewed for patient assistance eligibility]  Physical Activity Interventions  Other (Comments) [Encouraged exercise]       Objective:  Lab Results  Component Value Date   CREATININE 1.36 (H) 03/24/2019   CREATININE 1.57 (H) 03/19/2019   CREATININE 1.09 05/23/2016    Lab Results  Component Value Date   HGBA1C >14.0 (H) 03/19/2019       Component Value Date/Time   CHOL 150 03/19/2019 1611   TRIG 224 (H) 03/19/2019 1611   HDL 44 05/23/2016 1405   CHOLHDL 3.1 05/23/2016 1405   VLDL 45 (H) 03/19/2019 1611   LDLCALC 72 05/23/2016 1405    Clinical ASCVD: No  The 10-year ASCVD risk score Mikey Bussing DC Jr., et al., 2013) is: 41.4%   Values used to calculate the score:     Age: 71 years     Sex: Male     Is Non-Hispanic African American: No     Diabetic: Yes     Tobacco smoker: No     Systolic Blood Pressure: 251 mmHg     Is BP treated: Yes     HDL Cholesterol: 46 mg/dL     Total Cholesterol: 150 mg/dL    BP Readings from Last 3 Encounters:  03/24/19 (!) 143/69  03/19/19 108/66  08/08/17 118/60    No Known  Allergies  Medications Reviewed Today    Reviewed by De Hollingshead, [place] (Pharmacist) on 04/23/19 at 1146  Med List Status: <None>  Medication Order Taking? Sig Documenting Provider Last Dose Status Informant  ASPIRIN EC PO [id number] Yes Take 500 mg by mouth daily.  [provider] Taking Active   atorvastatin (LIPITOR) 20 MG tablet [id number] Yes Take 1 tablet (20 mg total) by mouth daily. Guadalupe Maple, MD Taking Active   benazepril (LOTENSIN) 40 MG tablet [id number] Yes Take 1 tablet (40 mg total) by mouth daily. Guadalupe Maple, MD Taking Active   Insulin Detemir (LEVEMIR) 100 UNIT/ML Pen [id number] Yes Inject 10 Units into the skin at bedtime. Marnee Guarneri T, NP Taking Active            Med Note De Hollingshead   Wed Apr 23, 2019 11:36 AM) 12 units QAM  Insulin Pen Needle 31G X 8 MM MISC [id number] Yes Use one needle to administer insulin into skin nightly. Marnee Guarneri T, NP Taking Active   metFORMIN (GLUCOPHAGE) 500 MG tablet [id number] Yes Take 2 tablets (1,000 mg total) by mouth 2 (two) times daily with a meal. Guadalupe Maple, MD Taking Active   Multiple Vitamins-Minerals (CENTRUM SILVER 50+MEN) TABS [id number] Yes Take by mouth. [provider] Taking Active  Assessment:   Goals Addressed            This Visit's Progress     Patient Stated   . PharmD "I want to stay healthy" (pt-stated)       CARE PLAN ENTRY (see longtitudinal plan of care for additional care plan information)  Current Barriers:  . Diabetes: uncontrolled but improved per glucose readings ; complicated by chronic medical conditions including HTN, HLD, most recent A1c >14% . Most recent eGFR: ~44 . Current antihyperglycemic regimen: metformin 1000 mg BID . Reports denies hypoglycemic symptoms, including dizziness, lightheadedness, shaking, sweating . Reports improvement in polyuria, nocturia, fatigue . Current meal patterns: o Notes he has cut out sweets,  most carbohydrates. Occasional small servings of ice cream . Current exercise: Exercises daily, has been walking with wife, as well as using an exercise machine for a total of 20 minutes daily  . Current blood glucose readings:  o Fastings 110s-180s, nothing in 200s in the past 2 weeks . Cardiovascular risk reduction: o Current hypertensive regimen: benazepril 40 mg daily o Current hyperlipidemia regimen: atorvastatin 20 mg daily, LDL at goal <70 on last check o Current antiplatelet regimen: reports he takes aspirin 500 mg daily. Denies hx CVA, MI, revascularization. Reports that his "blood clots quickly" when he cuts himself  Pharmacist Clinical Goal(s):  Marland Kitchen Over the next 90 days, patient will work with PharmD and primary care provider to address optimized medication management  Interventions: . Comprehensive medication review performed, medication list updated in electronic medical record . Reviewed goal A1c, goal fasting glucose, goal 2 hour post prandial glucose . Reviewed goal to transition to non insulin agents in the future, if possible. Reviewed current supply - he notes that he has ~3 month supply left of Levemir at home; will avoid making changes at this time. Moving forward, can consider switch to a better basal insulin - reviewed [employer] 2021 formulary, Tyler Aas is also preferred. For future reference, Ozempic, Trulicity, Jardiance, and Wilder Glade are also preferred options.  . Reviewed option for patient assistance in the future. Patient notes that his wife just retired, so they will not qualify for assistance now, but appreciates knowing that it is an option in the future.  . Strongly recommend patient reduce from aspirin 500 mg daily to aspirin 81 mg daily. Explained increased risk of bleeding with likely no increased benefit, and impact of daily high dose NSAID therapy on bleed risk, renal risk. Patient verbalized understanding and agreement to reduce to 81 mg daily. Encouraged to discuss  w/ PCP at visit this afternoon  Patient Self Care Activities:  . Patient will check blood glucose BID-TID, document, and provide at future appointment . Patient will take medications as prescribed . Patient will report any questions or concerns to provider   Initial goal documentation        Plan: - RN CM to follow up in April. Scheduled f/u call 07/18/19  Catie Darnelle Maffucci, PharmD, [place] [phone]

## 2019-04-23 NOTE — Assessment & Plan Note (Signed)
Chronic, ongoing with improvement in BS and symptoms.  Tolerating new insulin well.  BS improved with fasting 110-180.  Discussed at length BS goals with him.  Continue Metformin and Levemir 12 units.   Goal in long run is to lower A1C and sugars with transition off insulin in future and move towards GLP or SGLT.  Continue collaboration with CCM team.  Return in 2 months for A1C check.

## 2019-04-23 NOTE — Assessment & Plan Note (Signed)
Chronic, stable with BP below goal.  Continue Benazepril for kidney protection and HTN.  Recommend he monitor BP at home at least 3 mornings a week.  Change ASA dose to 81 MG daily, not 500 MG.  Return in 2 months.

## 2019-04-23 NOTE — Patient Instructions (Signed)
Visit Information  Goals Addressed            This Visit's Progress     Patient Stated   . PharmD "I want to stay healthy" (pt-stated)       CARE PLAN ENTRY (see longtitudinal plan of care for additional care plan information)  Current Barriers:  . Diabetes: uncontrolled but improved per glucose readings ; complicated by chronic medical conditions including HTN, HLD, most recent A1c >14% . Most recent eGFR: ~44 . Current antihyperglycemic regimen: metformin 1000 mg BID . Reports denies hypoglycemic symptoms, including dizziness, lightheadedness, shaking, sweating . Reports improvement in polyuria, nocturia, fatigue . Current meal patterns: o Notes he has cut out sweets, most carbohydrates. Occasional small servings of ice cream . Current exercise: Exercises daily, has been walking with wife, as well as using an exercise machine for a total of 20 minutes daily  . Current blood glucose readings:  o Fastings 110s-180s, nothing in 200s in the past 2 weeks . Cardiovascular risk reduction: o Current hypertensive regimen: benazepril 40 mg daily o Current hyperlipidemia regimen: atorvastatin 20 mg daily, LDL at goal <70 on last check o Current antiplatelet regimen: reports he takes aspirin 500 mg daily. Denies hx CVA, MI, revascularization. Reports that his "blood clots quickly" when he cuts himself  Pharmacist Clinical Goal(s):  Marland Kitchen Over the next 90 days, patient will work with PharmD and primary care provider to address optimized medication management  Interventions: . Comprehensive medication review performed, medication list updated in electronic medical record . Reviewed goal A1c, goal fasting glucose, goal 2 hour post prandial glucose . Reviewed goal to transition to non insulin agents in the future, if possible. Reviewed current supply - he notes that he has ~3 month supply left of Levemir at home; will avoid making changes at this time. Moving forward, can consider switch to a  better basal insulin - reviewed [employer] 2021 formulary, Tyler Aas is also preferred. For future reference, Ozempic, Trulicity, Jardiance, and Wilder Glade are also preferred options.  . Reviewed option for patient assistance in the future. Patient notes that his wife just retired, so they will not qualify for assistance now, but appreciates knowing that it is an option in the future.  . Strongly recommend patient reduce from aspirin 500 mg daily to aspirin 81 mg daily. Explained increased risk of bleeding with likely no increased benefit, and impact of daily high dose NSAID therapy on bleed risk, renal risk. Patient verbalized understanding and agreement to reduce to 81 mg daily. Encouraged to discuss w/ PCP at visit this afternoon  Patient Self Care Activities:  . Patient will check blood glucose BID-TID, document, and provide at future appointment . Patient will take medications as prescribed . Patient will report any questions or concerns to provider   Initial goal documentation        Patient verbalizes understanding of instructions provided today.  Plan: - RN CM to follow up in April. Scheduled f/u call 07/18/19  Catie Darnelle Maffucci, PharmD, [place] [phone]

## 2019-04-23 NOTE — Chronic Care Management (AMB) (Signed)
Chronic Care Management   Follow Up Note   04/23/2019 Name: Matthew [name] MRN: [id number] DOB: 05-07-1945  Referred by: Matthew Maple, MD Reason for referral : Chronic Care Management (Follow up: DM2/HLD/HTN)   Matthew [name] is a 74 y.o. year old male who is a primary care patient of [name], Matthew How, MD. The CCM team was consulted for assistance with chronic disease management and care coordination needs.    Review of patient status, including review of consultants reports, relevant laboratory and other test results, and collaboration with appropriate care team members and the patient's provider was performed as part of comprehensive patient evaluation and provision of chronic care management services.    SDOH (Social Determinants of Health) assessments performed: Yes See Care Plan activities for detailed interventions related to [facility])     Outpatient Encounter Medications as of 04/23/2019  Medication Sig Note   ASPIRIN EC PO Take 500 mg by mouth daily.     atorvastatin (LIPITOR) 20 MG tablet Take 1 tablet (20 mg total) by mouth daily.    benazepril (LOTENSIN) 40 MG tablet Take 1 tablet (40 mg total) by mouth daily.    Insulin Detemir (LEVEMIR) 100 UNIT/ML Pen Inject 10 Units into the skin at bedtime. 04/23/2019: 12 units QAM   Insulin Pen Needle 31G X 8 MM MISC Use one needle to administer insulin into skin nightly.    metFORMIN (GLUCOPHAGE) 500 MG tablet Take 2 tablets (1,000 mg total) by mouth 2 (two) times daily with a meal.    Multiple Vitamins-Minerals (CENTRUM SILVER 50+MEN) TABS Take by mouth.    No facility-administered encounter medications on file as of 04/23/2019.     Objective:  Lab Results  Component Value Date   HGBA1C >14.0 (H) 03/19/2019    Goals Addressed            This Visit's Progress    RNCM: I need help with getting my Diabetes under control       Current Barriers:   Chronic Disease Management support and education needs related to DM,  HLD, and HTN  Nurse Case Manager Clinical Goal(s):   Over the next 90 days, patient will verbalize understanding of plan for DM, HLD, and HTN management   Over the next 90 days days, patient will demonstrate a decrease in hyperglycemic exacerbations as evidenced by decreased blood glucose levels and Hemoglobin A1C 14  Over the next 90 days, patient will attend all scheduled medical appointments: next appointment with pcp on 04-23-2019  Over the next 90 days, patient will demonstrate improved adherence to prescribed treatment plan for DM as evidenced by improved signs and symptoms of hyperglycemia, blood sugar levels decreasing <200's, improved Hemoglobin A1C  Over the next 90 days, patient will demonstrate improved health management independence as evidenced by following a heart healthy/ADA Carb modified diet  Over the next 90 days, patient will demonstrate understanding of rationale for each prescribed medication as evidenced by taking medications as prescribed and talking to the pcp before adjusting insulin doses  Interventions:   Evaluation of current treatment plan related to DM, HLD, and HTN and patient's adherence to plan as established by provider.  Advised patient to call pcp for Blood sugar readings <90 or >400  Provided education to patient re: ADA/ carb modified diet; the patient states he has cut out all sweets. He is trying to watch carbohydrates.  He is not sure if he has lost weight or not but feels great  Reviewed medications  with the patient- co-visit with the CCM pharmacist,  The patient to talk to pcp about the high dose of ASA he is taking daily  Co-Visit with pharmacist via telephone today for review and assistance with Chronic Disease management and care  Discussed plans with patient for ongoing care management follow up and provided patient with direct contact information for care management team  Provided patient and/or caregiver with COVID19 information about  community resources- per the patient he will receive his first [place] vaccine on 04-24-2019  Provided patient with foot care educational materials related to slower wound healing in patients with DM and also other complication related to other chronic disease processes of HLD and HTN  Advised patient, providing education and rationale, to check cbg three times a day and record, calling pcp for findings outside established parameters.    Provided the patient with samples of Glucerna with coupons for pick up today at the office  Patient Self Care Activities:   Patient verbalizes understanding of plan to work with the interdisciplinary team to improve diabetes management and other chronic conditions of HLD and HTN  Self administers medications as prescribed  Attends all scheduled provider appointments  Unable to independently manage Chronic disease processes of DM, HLD, and HTN  Please see past updates related to this goal by clicking on the "Past Updates" button in the selected goal          Plan:   The care management team will reach out to the patient again over the next 60 days.    Noreene Larsson RN, MSN, [facility] Mobile: [phone]

## 2019-04-24 ENCOUNTER — Ambulatory Visit: Payer: Medicare HMO | Attending: Internal Medicine

## 2019-04-24 DIAGNOSIS — Z23 Encounter for immunization: Secondary | ICD-10-CM | POA: Insufficient documentation

## 2019-04-24 NOTE — Progress Notes (Signed)
   Covid-19 Vaccination Clinic  Name:  Matthew [name]    MRN: [id number] DOB: 1946-01-16  04/24/2019  Matthew [name] was observed post Covid-19 immunization for 15 minutes without incidence. He was provided with Vaccine Information Sheet and instruction to access the V-Safe system.   Matthew [name] was instructed to call 911 with any severe reactions post vaccine: Marland Kitchen Difficulty breathing  . Swelling of your face and throat  . A fast heartbeat  . A bad rash all over your body  . Dizziness and weakness    Immunizations Administered    Name Date Dose VIS Date Route   Pfizer COVID-19 Vaccine 04/24/2019  9:24 AM 0.3 mL 02/07/2019 Intramuscular   Manufacturer: [employer]   Lot: [id number]   [place]: [id number]

## 2019-05-20 ENCOUNTER — Ambulatory Visit: Payer: Medicare HMO | Attending: Internal Medicine

## 2019-05-20 DIAGNOSIS — Z23 Encounter for immunization: Secondary | ICD-10-CM

## 2019-05-20 NOTE — Progress Notes (Signed)
   Covid-19 Vaccination Clinic  Name:  AKIEM KOSKIE    MRN: [id number] DOB: 1945/04/21  05/20/2019  Mr. Conk was observed post Covid-19 immunization for 15 minutes without incident. He was provided with Vaccine Information Sheet and instruction to access the V-Safe system.   Mr. Rua was instructed to call 911 with any severe reactions post vaccine: Marland Kitchen Difficulty breathing  . Swelling of face and throat  . A fast heartbeat  . A bad rash all over body  . Dizziness and weakness   Immunizations Administered    Name Date Dose VIS Date Route   Pfizer COVID-19 Vaccine 05/20/2019  9:09 AM 0.3 mL 02/07/2019 Intramuscular   Manufacturer: [place]   Lot: [id number]   [place]: [id number]

## 2019-06-04 ENCOUNTER — Telehealth: Payer: Self-pay | Admitting: Family Medicine

## 2019-06-04 NOTE — Chronic Care Management (AMB) (Signed)
  Care Management   Note  06/04/2019 Name: HULON HERNE MRN: [id number] DOB: 01-26-1946  [name] is a 74 y.o. year old male who is a primary care patient of Crissman, Jeannette How, MD and is actively engaged with the care management team. I reached out to [name] by phone today to assist with re-scheduling a follow up visit with the Pharmacist  Follow up plan: Unsuccessful telephone outreach attempt made. A HIPPA compliant phone message was left for the patient providing contact information and requesting a return call.  The care management team will reach out to the patient again over the next 7 days.  If patient returns call to provider office, please advise to call [facility] [facility]  at [place], [place], [place], [place] [address] Direct Dial: [phone] [email or web address] Website: [email or web address]

## 2019-06-05 NOTE — Chronic Care Management (AMB) (Signed)
°  Care Management   Note  06/05/2019 Name: [name] MRN: [id number] DOB: [birth year]  Matthew [name] is a 74 y.o. year old male who is a primary care patient of Crissman, Jeannette How, MD and is actively engaged with the care management team. I reached out to Matthew [name] by phone today to assist with re-scheduling a follow up visit with the Pharmacist  Follow up plan: Telephone appointment with care management team member scheduled for:07/11/2019  Noreene Larsson, [place], [place], [place] [address] Direct Dial: [phone] [email or web address] Website: [email or web address]

## 2019-06-05 NOTE — Chronic Care Management (AMB) (Signed)
  Care Management   Note  06/05/2019 Name: Matthew [name] MRN: [id number] DOB: [birth year]  [name] is a 74 y.o. year old male who is a primary care patient of Crissman, Jeannette How, MD and is actively engaged with the care management team. I reached out to [name] by phone today to assist with re-scheduling a follow up visit with the Pharmacist  Follow up plan: Unsuccessful telephone outreach attempt made. A HIPPA compliant phone message was left for the patient providing contact information and requesting a return call.  The care management team will reach out to the patient again over the next 7 days.  If patient returns call to provider office, please advise to call [facility] [place]  at [place], [place], [place], [place] [address] Direct Dial: [phone] [email or web address] Website: [email or web address]

## 2019-06-16 ENCOUNTER — Other Ambulatory Visit: Payer: Self-pay

## 2019-06-16 ENCOUNTER — Encounter: Payer: Self-pay | Admitting: Family Medicine

## 2019-06-16 ENCOUNTER — Ambulatory Visit (INDEPENDENT_AMBULATORY_CARE_PROVIDER_SITE_OTHER): Payer: Medicare HMO

## 2019-06-16 ENCOUNTER — Ambulatory Visit (INDEPENDENT_AMBULATORY_CARE_PROVIDER_SITE_OTHER): Payer: Medicare HMO | Admitting: Family Medicine

## 2019-06-16 VITALS — BP 132/87 | HR 75 | Temp 97.8°F | Ht 70.0 in | Wt 236.0 lb

## 2019-06-16 VITALS — BP 132/87 | HR 75 | Temp 97.8°F | Wt 236.0 lb

## 2019-06-16 DIAGNOSIS — E1159 Type 2 diabetes mellitus with other circulatory complications: Secondary | ICD-10-CM

## 2019-06-16 DIAGNOSIS — R600 Localized edema: Secondary | ICD-10-CM | POA: Diagnosis not present

## 2019-06-16 DIAGNOSIS — Z0000 Encounter for general adult medical examination without abnormal findings: Secondary | ICD-10-CM | POA: Diagnosis not present

## 2019-06-16 DIAGNOSIS — I152 Hypertension secondary to endocrine disorders: Secondary | ICD-10-CM

## 2019-06-16 DIAGNOSIS — I10 Essential (primary) hypertension: Secondary | ICD-10-CM | POA: Diagnosis not present

## 2019-06-16 DIAGNOSIS — E1165 Type 2 diabetes mellitus with hyperglycemia: Secondary | ICD-10-CM

## 2019-06-16 DIAGNOSIS — Z794 Long term (current) use of insulin: Secondary | ICD-10-CM

## 2019-06-16 MED ORDER — OZEMPIC (0.25 OR 0.5 MG/DOSE) 2 MG/1.5ML ~~LOC~~ SOPN: 0.2500 mg | PEN_INJECTOR | SUBCUTANEOUS | 2 refills | Status: DC

## 2019-06-16 MED ORDER — HYDROCHLOROTHIAZIDE 12.5 MG PO CAPS: 12.5000 mg | ORAL_CAPSULE | Freq: Every day | ORAL | 2 refills | Status: DC

## 2019-06-16 NOTE — Assessment & Plan Note (Signed)
Improved on recheck. WIll add low dose HCTZ to help with lower leg edema, discussed home monitoring to watch for abnormal readings.

## 2019-06-16 NOTE — Assessment & Plan Note (Signed)
Recheck A1C, add ozempic to help further reduce BSs and mitigate weight gain caused by insulin that he's worried about. Continue monitoring home BSs closely and working on diet and exercise changes

## 2019-06-16 NOTE — Progress Notes (Signed)
BP 132/87   Pulse 75   Temp 97.8 F (36.6 C) (Oral)   Wt 236 lb (107 kg)   SpO2 100%   BMI 33.86 kg/m    Subjective:    Patient ID: Matthew [name], male    DOB: 1946-01-09, 74 y.o.   MRN: [id number]  HPI: Matthew [name] is a 74 y.o. male  Chief Complaint  Patient presents with  . Diabetes  . Hypertension  . Hyperlipidemia  . Edema    right lower leg and calf x over 2 weeks   Here today for chronic condition f/u.   DM - Last A1C was greater than 14. Home BSs at random are running around 116 - 180s range. Taking 14 units of levemir in addition to his metformin at this time. Denies low blood sugar spells. Trying to improve diet and exercise over time. Stays active through volunteer work but does not formally exercise.   HTN - Not checking home BPs. Taking medicine faithfully without side effects. Denies CP, SOB, HAs, dizziness.   2-3 weeks of b/l lower leg swelling, worse on the right. No pain, numbness, tingling. 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 Can't tolerate compression stockings but just bought some tight knee high socks to see if that helped. No pain, point tenderness, redness, CP, palpitations, SOB. No recent surgeries, long travel, smoking, etc.   Relevant past medical, surgical, family and social history reviewed and updated as indicated. Interim medical history since our last visit reviewed. Allergies and medications reviewed and updated.  Review of Systems  Per HPI unless specifically indicated above     Objective:    BP 132/87   Pulse 75   Temp 97.8 F (36.6 C) (Oral)   Wt 236 lb (107 kg)   SpO2 100%   BMI 33.86 kg/m   Wt Readings from Last 3 Encounters:  06/16/19 236 lb (107 kg)  06/16/19 236 lb (107 kg)  08/12/18 224 lb (101.6 kg)    Physical Exam Vitals and nursing note reviewed.  Constitutional:      Appearance: Normal appearance.  HENT:     Head: Atraumatic.  Eyes:     Extraocular Movements: Extraocular movements  intact.     Conjunctiva/sclera: Conjunctivae normal.  Cardiovascular:     Rate and Rhythm: Normal rate and regular rhythm.  Pulmonary:     Effort: Pulmonary effort is normal.     Breath sounds: Normal breath sounds.  Musculoskeletal:        General: Swelling (right greater than left, varicose veins present diffusely, worst on right side) present. No tenderness (no point tenderness b/l calves). Normal range of motion.     Cervical back: Normal range of motion and neck supple.     Comments: - homan's sign and squeeze test  Skin:    General: Skin is warm and dry.     Findings: No erythema.  Neurological:     General: No focal deficit present.     Mental Status: He is oriented to person, place, and time.  Psychiatric:        Mood and Affect: Mood normal.        Thought Content: Thought content normal.        Judgment: Judgment normal.     Results for orders placed or performed in visit on [id number]  Basic Metabolic Panel (BMET)  Result Value Ref Range   Glucose 192 (H) 65 - 99 mg/dL   BUN 24  8 - 27 mg/dL   Creatinine, Ser 1.36 (H) 0.76 - 1.27 mg/dL   GFR calc non Af Amer 52 (L) >59 mL/min/1.73   GFR calc Af Amer 60 >59 mL/min/1.73   BUN/Creatinine Ratio 18 10 - 24   Sodium 135 134 - 144 mmol/L   Potassium 4.2 3.5 - 5.2 mmol/L   Chloride 99 96 - 106 mmol/L   CO2 23 20 - 29 mmol/L   Calcium 9.4 8.6 - 10.2 mg/dL      Assessment & Plan:   Problem List Items Addressed This Visit      Cardiovascular and Mediastinum   Hypertension associated with diabetes ([place]) - Primary    Improved on recheck. WIll add low dose HCTZ to help with lower leg edema, discussed home monitoring to watch for abnormal readings.       Relevant Medications   Semaglutide,0.25 or 0.5MG /DOS, (OZEMPIC, 0.25 OR 0.5 MG/DOSE,) 2 MG/1.5ML SOPN   hydrochlorothiazide (MICROZIDE) 12.5 MG capsule     Endocrine   Type 2 diabetes mellitus with hyperglycemia (HCC)    Recheck A1C, add ozempic to help further reduce  BSs and mitigate weight gain caused by insulin that he's worried about. Continue monitoring home BSs closely and working on diet and exercise changes      Relevant Medications   Semaglutide,0.25 or 0.5MG /DOS, (OZEMPIC, 0.25 OR 0.5 MG/DOSE,) 2 MG/1.5ML SOPN   Other Relevant Orders   HgB A1c    Other Visit Diagnoses    Lower leg edema       Suspect venous insufficiency but will order u/s for DVT r/o. ER precautions given, start looser compression, leg elevation, increase exercise, low dose HCTZ   Relevant Orders   US Venous Img Lower Unilateral Right       Follow up plan: Return in about 3 months (around 09/15/2019) for DM, BP, HLD, leg edema f/u.

## 2019-06-16 NOTE — Patient Instructions (Signed)
Matthew [name] , Thank you for taking time to come for your Medicare Wellness Visit. I appreciate your ongoing commitment to your health goals. Please review the following plan we discussed and let me know if I can assist you in the future.   Screening recommendations/referrals: Colonoscopy: completed 2013, due 2023 Recommended yearly ophthalmology/optometry visit for glaucoma screening and checkup Recommended yearly dental visit for hygiene and checkup  Vaccinations: Influenza vaccine: up to date  Pneumococcal vaccine: up to date  Tdap vaccine: up to date  Shingles vaccine: up to date   Covid-19: completed   Advanced directives: Please bring a copy of your health care power of attorney and living will to the office at your convenience.  Conditions/risks identified: diabetic, already in contact with chronic care management team   Next appointment: Follow up in one year for your annual wellness visit.   Preventive Care 17 Years and Older, Male Preventive care refers to lifestyle choices and visits with your health care provider that can promote health and wellness. What does preventive care include?  A yearly physical exam. This is also called an annual well check.  Dental exams once or twice a year.  Routine eye exams. Ask your health care provider how often you should have your eyes checked.  Personal lifestyle choices, including:  Daily care of your teeth and gums.  Regular physical activity.  Eating a healthy diet.  Avoiding tobacco and drug use.  Limiting alcohol use.  Practicing safe sex.  Taking low doses of aspirin every day.  Taking vitamin and mineral supplements as recommended by your health care provider. What happens during an annual well check? The services and screenings done by your health care provider during your annual well check will depend on your age, overall health, lifestyle risk factors, and family history of disease. Counseling  Your health care  provider may ask you questions about your:  Alcohol use.  Tobacco use.  Drug use.  Emotional well-being.  Home and relationship well-being.  Sexual activity.  Eating habits.  History of falls.  Memory and ability to understand (cognition).  Work and work Statistician. Screening  You may have the following tests or measurements:  Height, weight, and BMI.  Blood pressure.  Lipid and cholesterol levels. These may be checked every 5 years, or more frequently if you are over 79 years old.  Skin check.  Lung cancer screening. You may have this screening every year starting at age 56 if you have a 30-pack-year history of smoking and currently smoke or have quit within the past 15 years.  Fecal occult blood test (FOBT) of the stool. You may have this test every year starting at age 38.  Flexible sigmoidoscopy or colonoscopy. You may have a sigmoidoscopy every 5 years or a colonoscopy every 10 years starting at age 63.  Prostate cancer screening. Recommendations will vary depending on your family history and other risks.  Hepatitis C blood test.  Hepatitis B blood test.  Sexually transmitted disease (STD) testing.  Diabetes screening. This is done by checking your blood sugar (glucose) after you have not eaten for a while (fasting). You may have this done every 1-3 years.  Abdominal aortic aneurysm (AAA) screening. You may need this if you are a current or former smoker.  Osteoporosis. You may be screened starting at age 14 if you are at high risk. Talk with your health care provider about your test results, treatment options, and if necessary, the need for more tests. Vaccines  Your health care provider may recommend certain vaccines, such as:  Influenza vaccine. This is recommended every year.  Tetanus, diphtheria, and acellular pertussis (Tdap, Td) vaccine. You may need a Td booster every 10 years.  Zoster vaccine. You may need this after age 28.  Pneumococcal  13-valent conjugate (PCV13) vaccine. One dose is recommended after age 89.  Pneumococcal polysaccharide (PPSV23) vaccine. One dose is recommended after age 41. Talk to your health care provider about which screenings and vaccines you need and how often you need them. This information is not intended to replace advice given to you by your health care provider. Make sure you discuss any questions you have with your health care provider. Document Released: 03/12/2015 Document Revised: 11/03/2015 Document Reviewed: 12/15/2014 Elsevier Interactive Patient Education  2017 [employer] Prevention in the Home Falls can cause injuries. They can happen to people of all ages. There are many things you can do to make your home safe and to help prevent falls. What can I do on the outside of my home?  Regularly fix the edges of walkways and driveways and fix any cracks.  Remove anything that might make you trip as you walk through a door, such as a raised step or threshold.  Trim any bushes or trees on the path to your home.  Use bright outdoor lighting.  Clear any walking paths of anything that might make someone trip, such as rocks or tools.  Regularly check to see if handrails are loose or broken. Make sure that both sides of any steps have handrails.  Any raised decks and porches should have guardrails on the edges.  Have any leaves, snow, or ice cleared regularly.  Use sand or salt on walking paths during winter.  Clean up any spills in your garage right away. This includes oil or grease spills. What can I do in the bathroom?  Use night lights.  Install grab bars by the toilet and in the tub and shower. Do not use towel bars as grab bars.  Use non-skid mats or decals in the tub or shower.  If you need to sit down in the shower, use a plastic, non-slip stool.  Keep the floor dry. Clean up any water that spills on the floor as soon as it happens.  Remove soap buildup in the  tub or shower regularly.  Attach bath mats securely with double-sided non-slip rug tape.  Do not have throw rugs and other things on the floor that can make you trip. What can I do in the bedroom?  Use night lights.  Make sure that you have a light by your bed that is easy to reach.  Do not use any sheets or blankets that are too big for your bed. They should not hang down onto the floor.  Have a firm chair that has side arms. You can use this for support while you get dressed.  Do not have throw rugs and other things on the floor that can make you trip. What can I do in the kitchen?  Clean up any spills right away.  Avoid walking on wet floors.  Keep items that you use a lot in easy-to-reach places.  If you need to reach something above you, use a strong step stool that has a grab bar.  Keep electrical cords out of the way.  Do not use floor polish or wax that makes floors slippery. If you must use wax, use non-skid floor wax.  Do  not have throw rugs and other things on the floor that can make you trip. What can I do with my stairs?  Do not leave any items on the stairs.  Make sure that there are handrails on both sides of the stairs and use them. Fix handrails that are broken or loose. Make sure that handrails are as long as the stairways.  Check any carpeting to make sure that it is firmly attached to the stairs. Fix any carpet that is loose or worn.  Avoid having throw rugs at the top or bottom of the stairs. If you do have throw rugs, attach them to the floor with carpet tape.  Make sure that you have a light switch at the top of the stairs and the bottom of the stairs. If you do not have them, ask someone to add them for you. What else can I do to help prevent falls?  Wear shoes that:  Do not have high heels.  Have rubber bottoms.  Are comfortable and fit you well.  Are closed at the toe. Do not wear sandals.  If you use a stepladder:  Make sure that it  is fully opened. Do not climb a closed stepladder.  Make sure that both sides of the stepladder are locked into place.  Ask someone to hold it for you, if possible.  Clearly mark and make sure that you can see:  Any grab bars or handrails.  First and last steps.  Where the edge of each step is.  Use tools that help you move around (mobility aids) if they are needed. These include:  Canes.  Walkers.  Scooters.  Crutches.  Turn on the lights when you go into a dark area. Replace any light bulbs as soon as they burn out.  Set up your furniture so you have a clear path. Avoid moving your furniture around.  If any of your floors are uneven, fix them.  If there are any pets around you, be aware of where they are.  Review your medicines with your doctor. Some medicines can make you feel dizzy. This can increase your chance of falling. Ask your doctor what other things that you can do to help prevent falls. This information is not intended to replace advice given to you by your health care provider. Make sure you discuss any questions you have with your health care provider. Document Released: 12/10/2008 Document Revised: 07/22/2015 Document Reviewed: 03/20/2014 Elsevier Interactive Patient Education  2017 [employer].

## 2019-06-16 NOTE — Progress Notes (Signed)
Subjective:   Matthew [name] is a 74 y.o. male who presents for Medicare Annual/Subsequent preventive examination.  Review of Systems:  Cardiac Risk Factors include: male gender;advanced age (>41men, >47 women)     Objective:    Vitals: BP 132/87   Pulse 75   Temp 97.8 F (36.6 C)   Ht 5\' 10"  (1.778 m)   Wt 236 lb (107 kg)   BMI 33.86 kg/m   Body mass index is 33.86 kg/m.  Advanced Directives 06/16/2019 08/12/2018  Does Patient Have a Medical Advance Directive? Yes Yes  Type of Advance Directive Living will;Healthcare Power of Attorney Living will;Healthcare Power of [place] in Chart? No - copy requested No - copy requested    Tobacco Social History   Tobacco Use  Smoking Status Never Smoker  Smokeless Tobacco Never Used     Counseling given: Not Answered   Clinical Intake:  Pre-visit preparation completed: Yes  Pain : No/denies pain     Nutritional Status: BMI > 30  Obese Nutritional Risks: None Diabetes: Yes CBG done?: No Did pt. bring in CBG monitor from home?: No  How often do you need to have someone help you when you read instructions, pamphlets, or other written materials from your doctor or pharmacy?: 1 - Never  Nutrition Risk Assessment:  Has the patient had any N/V/D within the last 2 months?  No  Does the patient have any non-healing wounds?  No  Has the patient had any unintentional weight loss or weight gain?  No   Diabetes:  Is the patient diabetic?  Yes  If diabetic, was a CBG obtained today?  No  Did the patient bring in their glucometer from home?  No  How often do you monitor your CBG's? Daily   Financial Strains and Diabetes Management:  Are you having any financial strains with the device, your supplies or your medication? No .  Does the patient want to be seen by Chronic Care Management for management of their diabetes?  No  Would the patient like to be referred to a Nutritionist or for  Diabetic Management?  No   Already in contact with CCM team.   Diabetic Exams:  Diabetic Eye Exam: Completed 01/08/2019.   Diabetic Foot Exam: Completed 03/24/2019 Interpreter Needed?: No  Information entered by :: Maynard David,LPN  Past Medical History:  Diagnosis Date  . Asthma   . Diabetes mellitus without complication ([place])   . Hyperlipidemia   . Sleep apnea    Past Surgical History:  Procedure Laterality Date  . HERNIA REPAIR     Family History  Problem Relation Age of Onset  . Diabetes Mother   . Hypertension Father   . Diabetes Sister   . Diabetes Brother   . Diabetes Maternal Grandmother    Social History   Socioeconomic History  . Marital status: Married    Spouse name: Kadon Rauch  . Number of children: Not on file  . Years of education: Not on file  . Highest education level: Not on file  Occupational History  . Occupation: retired  Tobacco Use  . Smoking status: Never Smoker  . Smokeless tobacco: Never Used  Substance and Sexual Activity  . Alcohol use: No    Comment: rare  . Drug use: No  . Sexual activity: Not on file  Other Topics Concern  . 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 Difficulty of Paying Living Expenses: Somewhat hard  Food Insecurity: No Food Insecurity  . Worried About Charity fundraiser in the Last Year: Never true  . Ran Out of Food in the Last Year: Never true  Transportation Needs: No Transportation Needs  . Lack of Transportation (Medical): No  . Lack of Transportation (Non-Medical): No  Physical Activity: Sufficiently Active  . Days of Exercise per Week: 7 days  . Minutes of Exercise per Session: 30 min  Stress: No Stress Concern Present  . Feeling of Stress : Not at all  Social Connections: Not Isolated  . Frequency of Communication with Friends and Family: More than  three times a week  . Frequency of Social Gatherings with Friends and Family: More than three times a week  . Attends Religious Services: More than 4 times per year  . Active Member of Clubs or Organizations: Yes  . Attends Archivist Meetings: More than 4 times per year  . Marital Status: Married    Outpatient Encounter Medications as of 06/16/2019  Medication Sig  . ASPIRIN EC PO Take 500 mg by mouth daily.   Marland Kitchen atorvastatin (LIPITOR) 20 MG tablet Take 1 tablet (20 mg total) by mouth daily.  . benazepril (LOTENSIN) 40 MG tablet Take 1 tablet (40 mg total) by mouth daily.  . hydrochlorothiazide (MICROZIDE) 12.5 MG capsule Take 1 capsule (12.5 mg total) by mouth daily.  . Insulin Detemir (LEVEMIR) 100 UNIT/ML Pen Inject 10 Units into the skin at bedtime.  . Insulin Pen Needle 31G X 8 MM MISC Use one needle to administer insulin into skin nightly.  . metFORMIN (GLUCOPHAGE) 500 MG tablet Take 2 tablets (1,000 mg total) by mouth 2 (two) times daily with a meal.  . Multiple Vitamins-Minerals (CENTRUM SILVER 50+MEN) TABS Take by mouth.  . Semaglutide,0.25 or 0.5MG /DOS, (OZEMPIC, 0.25 OR 0.5 MG/DOSE,) 2 MG/1.5ML SOPN Inject 0.25 mg into the skin once a week.   No facility-administered encounter medications on file as of 06/16/2019.    Activities of Daily Living In your present state of health, do you have any difficulty performing the following activities: 06/16/2019 08/12/2018  Hearing? Y Y  Comment no hearing aids, amplifiers for heairng tv -  Vision? N N  Comment eyeglasses, [employer] vision -  Difficulty concentrating or making decisions? N N  Walking or climbing stairs? N N  Dressing or bathing? N N  Doing errands, shopping? N N  Preparing Food and eating ? N N  Using the Toilet? N N  In the past six months, have you accidently leaked urine? N N  Do you have problems with loss of bowel control? N N  Managing your Medications? N N  Managing your Finances? N N  Housekeeping or  managing your Housekeeping? N N  Some recent data might be hidden    Patient Care Team: Guadalupe Maple, MD as PCP - General (Family Medicine) Vanita Ingles, RN as Case Manager ([place]) De Hollingshead, [facility] as Pharmacist (Pharmacist)   Assessment:   This is a routine wellness examination for [place].  Exercise Activities and Dietary recommendations Current Exercise Habits: Home exercise routine(volunteers  as well.), Type of exercise: walking, Time (Minutes): 30, Frequency (Times/Week): 5, Weekly Exercise (Minutes/Week): 150, Intensity: Mild, Exercise limited by: None identified  Goals Addressed   None     Fall  Risk Fall Risk  06/16/2019 08/12/2018 08/08/2017 05/23/2016 02/19/2015  Falls in the past year? 0 0 No No No  Number falls in past yr: 0 - - - -  Injury with Fall? 0 - - - -   FALL RISK PREVENTION PERTAINING TO THE HOME:  Any stairs in or around the home? Yes  going into home  If so, are there any without handrails? No   Home free of loose throw rugs in walkways, pet beds, electrical cords, etc? Yes  Adequate lighting in your home to reduce risk of falls? Yes   ASSISTIVE DEVICES UTILIZED TO PREVENT FALLS:  Life alert? No  Use of a cane, walker or w/c? No  Grab bars in the bathroom? No  Shower chair or bench in shower? No  Elevated toilet seat or a handicapped toilet? No    TIMED UP AND GO:  Was the test performed? Yes .  Length of time to ambulate 10 feet: 8 sec.   GAIT:  Appearance of gait: Gait steady and fast without the use of an assistive device.  Education: Fall risk prevention has been discussed.  Intervention(s) required? No   DME/home health order needed?  No    Depression Screen PHQ 2/9 Scores 06/16/2019 08/12/2018 08/08/2017 05/23/2016  PHQ - 2 Score 0 0 0 0    Cognitive Function     6CIT Screen 06/16/2019 08/08/2017  What Year? 0 points 0 points  What month? 0 points 0 points  What time? 0 points 0 points  Count back from 20 0  points 0 points  Months in reverse 0 points 0 points  Repeat phrase 0 points 0 points  Total Score 0 0    Immunization History  Administered Date(s) Administered  . Influenza, High Dose Seasonal PF 12/29/2013, 11/15/2015  . Influenza,inj,Quad PF,6+ Mos 12/30/2014, 12/04/2018  . Influenza-Unspecified 12/25/2016  . PFIZER SARS-COV-2 Vaccination 04/24/2019, 05/20/2019  . Pneumococcal Conjugate-13 12/30/2014  . Pneumococcal Polysaccharide-23 03/24/2019  . Pneumococcal-Unspecified 06/16/2011  . Tdap 03/15/2011  . Zoster 06/16/2011  . Zoster Recombinat (Shingrix) 04/02/2019    Qualifies for Shingles Vaccine? Yes  shingrix completed   Tdap: up to date   Flu Vaccine: up to date   Pneumococcal Vaccine: up to date   Covid-19 Vaccine: Completed vaccines  Screening Tests Health Maintenance  Topic Date Due  . HEMOGLOBIN A1C  09/16/2019  . INFLUENZA VACCINE  09/28/2019  . OPHTHALMOLOGY EXAM  01/08/2020  . FOOT EXAM  03/23/2020  . TETANUS/TDAP  03/14/2021  . COLONOSCOPY  06/28/2021  . COVID-19 Vaccine  Completed  . Hepatitis C Screening  Completed  . PNA vac Low Risk Adult  Completed   Cancer Screenings:  Colorectal Screening: Completed 06/29/2011. Repeat every 10 years  Lung Cancer Screening: (Low Dose CT Chest recommended if Age 65-80 years, 30 pack-year currently smoking OR have quit w/in 15years.) does not qualify.    Additional Screening:  Hepatitis C Screening: does qualify; Completed 2019  Dental Screening: Recommended annual dental exams for proper oral hygiene  Community Resource Referral:  CRR required this visit?  No        Plan:  I have personally reviewed and addressed the Medicare Annual Wellness questionnaire and have noted the following in the patient's chart:  A. Medical and social history B. Use of alcohol, tobacco or illicit drugs  C. Current medications and supplements D. Functional ability and status E.  Nutritional status F.  Physical  activity G. Advance directives H. List of other  physicians I.  Hospitalizations, surgeries, and ER visits in previous 12 months J.  [place] such as hearing and vision if needed, cognitive and depression L. Referrals and appointments   In addition, I have reviewed and discussed with patient certain preventive protocols, quality metrics, and best practice recommendations. A written personalized care plan for preventive services as well as general preventive health recommendations were provided to patient.   Signed,   Bevelyn Ngo, LPN  [id number] Nurse Health Advisor  Nurse Notes: none

## 2019-06-17 ENCOUNTER — Telehealth: Payer: Self-pay | Admitting: Family Medicine

## 2019-06-17 LAB — HEMOGLOBIN A1C
Est. average glucose Bld gHb Est-mCnc: 163 mg/dL
Hgb A1c MFr Bld: 7.3 % — ABNORMAL HIGH (ref 4.8–5.6)

## 2019-06-17 NOTE — Telephone Encounter (Signed)
OK to give samples 

## 2019-06-17 NOTE — Telephone Encounter (Signed)
Routing to provider. Ok to give samples of medication?

## 2019-06-17 NOTE — Telephone Encounter (Signed)
Copied from [place] [id number]. Topic: General - Other >> Jun 17, 2019  1:42 PM Celene Kras wrote: Reason for CRM: PT called stating that the ozempic he received yesterday he left in his truck for ten hours because he was not advised to refrigerate it. Pt contacted pharmacy and they told him he should not use the medication. Pt is requesting to know if there are any samples he can have to avoid another $100 co pay. Please advise.

## 2019-06-18 ENCOUNTER — Telehealth: Payer: Self-pay

## 2019-06-18 ENCOUNTER — Ambulatory Visit: Payer: Self-pay | Admitting: General Practice

## 2019-06-18 NOTE — Telephone Encounter (Signed)
2 samples of Ozempic signed out for patient. Called and LVM letting patient know he could pick samples up.

## 2019-06-18 NOTE — Chronic Care Management (AMB) (Signed)
  Chronic Care Management   Outreach Note  06/18/2019 Name: Matthew [name] MRN: [id number] DOB: Apr 09, 1945  Referred by: Guadalupe Maple, MD Reason for referral : Chronic Care Management (Follow up on: DM/HTN/HLD- new A1c 7.3)   An unsuccessful telephone outreach was attempted today. The patient was referred to the case management team for assistance with care management and care coordination. [place] could not hear well. Rescheduling appointment.   Follow Up Plan: The care management team will reach out to the patient again over the next 14 to 30 days.   Noreene Larsson RN, MSN, [facility] Mobile: [phone]

## 2019-06-19 NOTE — Telephone Encounter (Signed)
Called and LVM asking for patient to please return my call.  

## 2019-06-20 NOTE — Telephone Encounter (Signed)
Called and LVM asking for patient to please return my call.  

## 2019-06-23 ENCOUNTER — Telehealth: Payer: Self-pay

## 2019-06-23 NOTE — Telephone Encounter (Signed)
Pt picked up 2 sample boxes of Ozempic.

## 2019-06-23 NOTE — Telephone Encounter (Signed)
Patient returned my call and was notified that samples were ready to be picked up.

## 2019-07-02 ENCOUNTER — Ambulatory Visit
Admission: RE | Admit: 2019-07-02 | Discharge: 2019-07-02 | Disposition: A | Discharge status: Home / Self Care | Payer: Medicare HMO | Source: Ambulatory Visit | Attending: Family Medicine | Admitting: Family Medicine

## 2019-07-02 ENCOUNTER — Other Ambulatory Visit: Payer: Self-pay

## 2019-07-02 DIAGNOSIS — R600 Localized edema: Secondary | ICD-10-CM | POA: Insufficient documentation

## 2019-07-09 ENCOUNTER — Telehealth: Payer: Self-pay

## 2019-07-11 ENCOUNTER — Telehealth: Payer: Self-pay

## 2019-07-11 ENCOUNTER — Ambulatory Visit: Payer: Self-pay | Admitting: Pharmacist

## 2019-07-11 NOTE — Chronic Care Management (AMB) (Signed)
  Chronic Care Management   Note  07/11/2019 Name: BRITNEY BETT MRN: [id number] DOB: [birth year]  [name] is a 74 y.o. year old male who is a primary care patient of Crissman, Jeannette How, MD. The CCM team was consulted for assistance with chronic disease management and care coordination needs.    Attempted to contact patient for medication management review. Left message with an unidentified male asking him to return my call at his convenience. She did note that patient would benefit from talking to me about his medications.  Plan: - Will outreach next to follow up  [place], PharmD, [place] [phone]

## 2019-07-15 ENCOUNTER — Ambulatory Visit (INDEPENDENT_AMBULATORY_CARE_PROVIDER_SITE_OTHER): Payer: Medicare HMO | Admitting: Pharmacist

## 2019-07-15 DIAGNOSIS — I10 Essential (primary) hypertension: Secondary | ICD-10-CM | POA: Diagnosis not present

## 2019-07-15 DIAGNOSIS — Z794 Long term (current) use of insulin: Secondary | ICD-10-CM | POA: Diagnosis not present

## 2019-07-15 DIAGNOSIS — E1165 Type 2 diabetes mellitus with hyperglycemia: Secondary | ICD-10-CM | POA: Diagnosis not present

## 2019-07-15 DIAGNOSIS — E1159 Type 2 diabetes mellitus with other circulatory complications: Secondary | ICD-10-CM

## 2019-07-15 DIAGNOSIS — I152 Hypertension secondary to endocrine disorders: Secondary | ICD-10-CM

## 2019-07-15 NOTE — Chronic Care Management (AMB) (Signed)
Chronic Care Management   Follow Up Note   07/15/2019 Name: Matthew [name] MRN: [id number] DOB: 25-Apr-1945  Referred by: Guadalupe Maple, MD Reason for referral : Chronic Care Management (Medication Management)   Matthew [name] is a 74 y.o. year old male who is a primary care patient of Crissman, Jeannette How, MD. The CCM team was consulted for assistance with chronic disease management and care coordination needs.    Patient returned my call from last week with questions regarding medication adjustments.   Review of patient status, including review of consultants reports, relevant laboratory and other test results, and collaboration with appropriate care team members and the patient's provider was performed as part of comprehensive patient evaluation and provision of chronic care management services.    SDOH (Social Determinants of Health) assessments performed: No See Care Plan activities for detailed interventions related to [facility])     Outpatient Encounter Medications as of 07/15/2019  Medication Sig Note  . ASPIRIN EC PO Take 81 mg by mouth daily.    Marland Kitchen atorvastatin (LIPITOR) 20 MG tablet Take 1 tablet (20 mg total) by mouth daily.   . benazepril (LOTENSIN) 40 MG tablet Take 1 tablet (40 mg total) by mouth daily.   . hydrochlorothiazide (MICROZIDE) 12.5 MG capsule Take 1 capsule (12.5 mg total) by mouth daily.   . Insulin Detemir (LEVEMIR) 100 UNIT/ML Pen Inject 10 Units into the skin at bedtime. 04/23/2019: 12 units QAM  . Insulin Pen Needle 31G X 8 MM MISC Use one needle to administer insulin into skin nightly.   . metFORMIN (GLUCOPHAGE) 500 MG tablet Take 2 tablets (1,000 mg total) by mouth 2 (two) times daily with a meal.   . Multiple Vitamins-Minerals (CENTRUM SILVER 50+MEN) TABS Take by mouth.   . Semaglutide,0.25 or 0.5MG/DOS, (OZEMPIC, 0.25 OR 0.5 MG/DOSE,) 2 MG/1.5ML SOPN Inject 0.25 mg into the skin once a week. (Patient not taking: Reported on 07/15/2019)    No  facility-administered encounter medications on file as of 07/15/2019.     Objective:   Goals Addressed            This Visit's Progress     Patient Stated   . PharmD "I want to stay healthy" (pt-stated)       CARE PLAN ENTRY (see longtitudinal plan of care for additional care plan information)  Current Barriers:  . Diabetes: uncontrolled but improved per glucose readings ; complicated by chronic medical conditions including HTN, HLD, most recent A1c 7.3% o Had questions today regarding the Ozempic; has not started yet d/t concerns. Wonders if any drug interactions, how the medication works, what time of the day he should take it, and any long term considerations.  . Most recent eGFR: ~44 . Current antihyperglycemic regimen: metformin 1000 mg BID, Levemir 15 units daily; Ozempic - prescribed, but has not started yet.  . Current exercise: Exercises daily through walking . Current blood glucose readings:  o Fastings 109-170s . Cardiovascular risk reduction: o Current hypertensive regimen: benazepril 40 mg daily, HCTZ 12.5 mg daily  o Current hyperlipidemia regimen: atorvastatin 20 mg daily, LDL at goal <70 on last check o Current antiplatelet regimen: ASA 81 mg daily   Pharmacist Clinical Goal(s):  Marland Kitchen Over the next 90 days, patient will work with PharmD and primary care provider to address optimized medication management  Interventions: . Comprehensive medication review performed, medication list updated in electronic medical record . Inter-disciplinary care team collaboration (see longitudinal plan of care) . Reviewed  goal A1c, goal fasting, goal 2 hour post prandial glucose . Reviewed GLP1 mechanism, long term benefits, side effects. Reviewed that goal would be to maximize Ozempic to allow for d/c of Levemir, d/t risk of hypoglycemia and weight gain with insulin. He verbalizes understanding. Reviewed that there is no impact on his other medications, save likely being able to  decrease Levemir over time as Ozempic dose is increased. Patient verbalizes understanding, and will start Ozempic in the next few days . Reviewed to take HCTZ QAM to prevent bothersome nocturia. He verbalizes understanding.   Patient Self Care Activities:  . Patient will check blood glucose BID-TID, document, and provide at future appointment . Patient will take medications as prescribed . Patient will report any questions or concerns to provider   Initial goal documentation         Plan:  - Scheduled f/u call in ~ 3 weeks for insulin adjustments  Catie Darnelle Maffucci, PharmD, [place] [phone]

## 2019-07-15 NOTE — Patient Instructions (Signed)
Visit Information  Goals Addressed            This Visit's Progress     Patient Stated   . PharmD "I want to stay healthy" (pt-stated)       CARE PLAN ENTRY (see longtitudinal plan of care for additional care plan information)  Current Barriers:  . Diabetes: uncontrolled but improved per glucose readings ; complicated by chronic medical conditions including HTN, HLD, most recent A1c 7.3% o Had questions today regarding the Ozempic; has not started yet d/t concerns. Wonders if any drug interactions, how the medication works, what time of the day he should take it, and any long term considerations.  . Most recent eGFR: ~44 . Current antihyperglycemic regimen: metformin 1000 mg BID, Levemir 15 units daily; Ozempic - prescribed, but has not started yet.  . Current exercise: Exercises daily through walking . Current blood glucose readings:  o Fastings 109-170s . Cardiovascular risk reduction: o Current hypertensive regimen: benazepril 40 mg daily, HCTZ 12.5 mg daily  o Current hyperlipidemia regimen: atorvastatin 20 mg daily, LDL at goal <70 on last check o Current antiplatelet regimen: ASA 81 mg daily   Pharmacist Clinical Goal(s):  Marland Kitchen Over the next 90 days, patient will work with PharmD and primary care provider to address optimized medication management  Interventions: . Comprehensive medication review performed, medication list updated in electronic medical record . Inter-disciplinary care team collaboration (see longitudinal plan of care) . Reviewed goal A1c, goal fasting, goal 2 hour post prandial glucose . Reviewed GLP1 mechanism, long term benefits, side effects. Reviewed that goal would be to maximize Ozempic to allow for d/c of Levemir, d/t risk of hypoglycemia and weight gain with insulin. He verbalizes understanding. Reviewed that there is no impact on his other medications, save likely being able to decrease Levemir over time as Ozempic dose is increased. Patient verbalizes  understanding, and will start Ozempic in the next few days . Reviewed to take HCTZ QAM to prevent bothersome nocturia. He verbalizes understanding.   Patient Self Care Activities:  . Patient will check blood glucose BID-TID, document, and provide at future appointment . Patient will take medications as prescribed . Patient will report any questions or concerns to provider   Initial goal documentation        Patient verbalizes understanding of instructions provided today.   Plan:  - Scheduled f/u call in ~ 3 weeks for insulin adjustments  Catie Darnelle Maffucci, PharmD, [place] [phone]

## 2019-07-25 ENCOUNTER — Telehealth: Payer: Self-pay

## 2019-07-25 ENCOUNTER — Ambulatory Visit: Payer: Self-pay | Admitting: General Practice

## 2019-07-25 NOTE — Chronic Care Management (AMB) (Signed)
°  Chronic Care Management   Outreach Note  07/25/2019 Name: Matthew [name] MRN: [id number] DOB: 1945-12-28  Referred by: Guadalupe Maple, MD Reason for referral : Chronic Care Management (Follow up: HTN/HLD/DM)   A second unsuccessful telephone outreach was attempted today. The patient was referred to the case management team for assistance with care management and care coordination. The patient answered but states he was busy and could not talk right now. The patient states that he is doing fine.   Follow Up Plan: The care management team will reach out to the patient again over the next 30 to 60 days.   Noreene Larsson RN, MSN, [facility] Mobile: [phone]

## 2019-07-29 ENCOUNTER — Other Ambulatory Visit: Payer: Self-pay | Admitting: Nurse Practitioner

## 2019-07-29 DIAGNOSIS — E7800 Pure hypercholesterolemia, unspecified: Secondary | ICD-10-CM

## 2019-07-29 DIAGNOSIS — I10 Essential (primary) hypertension: Secondary | ICD-10-CM

## 2019-07-29 DIAGNOSIS — E119 Type 2 diabetes mellitus without complications: Secondary | ICD-10-CM

## 2019-08-08 ENCOUNTER — Ambulatory Visit (INDEPENDENT_AMBULATORY_CARE_PROVIDER_SITE_OTHER): Payer: Medicare HMO | Admitting: Pharmacist

## 2019-08-08 DIAGNOSIS — I10 Essential (primary) hypertension: Secondary | ICD-10-CM | POA: Diagnosis not present

## 2019-08-08 DIAGNOSIS — I152 Hypertension secondary to endocrine disorders: Secondary | ICD-10-CM

## 2019-08-08 DIAGNOSIS — E1159 Type 2 diabetes mellitus with other circulatory complications: Secondary | ICD-10-CM

## 2019-08-08 DIAGNOSIS — E1169 Type 2 diabetes mellitus with other specified complication: Secondary | ICD-10-CM | POA: Diagnosis not present

## 2019-08-08 DIAGNOSIS — E1165 Type 2 diabetes mellitus with hyperglycemia: Secondary | ICD-10-CM | POA: Diagnosis not present

## 2019-08-08 DIAGNOSIS — E785 Hyperlipidemia, unspecified: Secondary | ICD-10-CM | POA: Diagnosis not present

## 2019-08-08 DIAGNOSIS — Z794 Long term (current) use of insulin: Secondary | ICD-10-CM

## 2019-08-08 NOTE — Chronic Care Management (AMB) (Signed)
Chronic Care Management   Follow Up Note   08/08/2019 Name: Matthew [name] MRN: [id number] DOB: 16-Jun-1945  Referred by: Merrie Roof Reason for referral : Chronic Care Management (Medication Management)   SEQUOIA MINCEY is a 74 y.o. year old male who is a primary care patient of Merrie Roof PA. The CCM team was consulted for assistance with chronic disease management and care coordination needs.    Contacted patient for medication management review.   Review of patient status, including review of consultants reports, relevant laboratory and other test results, and collaboration with appropriate care team members and the patient's provider was performed as part of comprehensive patient evaluation and provision of chronic care management services.    SDOH (Social Determinants of Health) assessments performed: No See Care Plan activities for detailed interventions related to [facility])     Outpatient Encounter Medications as of 08/08/2019  Medication Sig Note  . ASPIRIN EC PO Take 81 mg by mouth daily.    Marland Kitchen atorvastatin (LIPITOR) 20 MG tablet Take 1 tablet (20 mg total) by mouth daily.   . benazepril (LOTENSIN) 40 MG tablet Take 1 tablet (40 mg total) by mouth daily.   . hydrochlorothiazide (MICROZIDE) 12.5 MG capsule Take 1 capsule (12.5 mg total) by mouth daily.   . Insulin Detemir (LEVEMIR) 100 UNIT/ML Pen Inject 10 Units into the skin at bedtime. 08/08/2019: 15 units  . Insulin Pen Needle 31G X 8 MM MISC Use one needle to administer insulin into skin nightly.   . metFORMIN (GLUCOPHAGE) 500 MG tablet Take 2 tablets (1,000 mg total) by mouth 2 (two) times daily with a meal.   . Multiple Vitamins-Minerals (CENTRUM SILVER 50+MEN) TABS Take by mouth.   . Semaglutide,0.25 or 0.5MG/DOS, (OZEMPIC, 0.25 OR 0.5 MG/DOSE,) 2 MG/1.5ML SOPN Inject 0.25 mg into the skin once a week.    No facility-administered encounter medications on file as of 08/08/2019.     Objective:   Goals Addressed               This Visit's Progress     Patient Stated   .  PharmD "I want to stay healthy" (pt-stated)        CARE PLAN ENTRY (see longtitudinal plan of care for additional care plan information)  Current Barriers:  . Diabetes: uncontrolled but improved per glucose readings; complicated by chronic medical conditions including HTN, HLD, most recent A1c 7.3% . Most recent eGFR: ~44 mL . Current antihyperglycemic regimen: metformin 1000 mg BID, Levemir 15 units daily; Ozempic 0.25 mg weekly (x 3 weeks) . Current blood glucose readings:  o Randoms readings low 100s-160s.  . Cardiovascular risk reduction: o Current hypertensive regimen: benazepril 40 mg daily, HCTZ 12.5 mg daily; not checking BP at home since HCTZ was added  o Current hyperlipidemia regimen: atorvastatin 20 mg daily, LDL at goal <70 on last check o Current antiplatelet regimen: ASA 81 mg daily   Pharmacist Clinical Goal(s):  Marland Kitchen Over the next 90 days, patient will work with PharmD and primary care provider to address optimized medication management  Interventions: . Comprehensive medication review performed, medication list updated in electronic medical record . Inter-disciplinary care team collaboration (see longitudinal plan of care) . Reviewed instruction to increase Ozempic to 0.5 mg after 4 weeks of 0.25 mg dose. Instructed that if low blood sugars develop (fastings <100), to reduce Levemir to 10-12 units daily. Patient verbalized understanding . Reviewed benefit of occasional BP checks at home to ensure maintained goals.  He verbalized understanding . Reiterated benefit of ASA and statin for cardiovascular risk reduction.   Patient Self Care Activities:  . Patient will check blood glucose BID-TID, document, and provide at future appointment . Patient will take medications as prescribed . Patient will report any questions or concerns to provider   Please see past updates related to this goal by clicking on the "Past  Updates" button in the selected goal          Plan:  - Scheduled f/u call in ~ 8 weeks  Catie Darnelle Maffucci, PharmD, [place] [phone]

## 2019-08-08 NOTE — Patient Instructions (Signed)
Visit Information  Goals Addressed              This Visit's Progress     Patient Stated   .  PharmD "I want to stay healthy" (pt-stated)        CARE PLAN ENTRY (see longtitudinal plan of care for additional care plan information)  Current Barriers:  . Diabetes: uncontrolled but improved per glucose readings; complicated by chronic medical conditions including HTN, HLD, most recent A1c 7.3% . Most recent eGFR: ~44 mL . Current antihyperglycemic regimen: metformin 1000 mg BID, Levemir 15 units daily; Ozempic 0.25 mg weekly (x 3 weeks) . Current blood glucose readings:  o Randoms readings low 100s-160s.  . Cardiovascular risk reduction: o Current hypertensive regimen: benazepril 40 mg daily, HCTZ 12.5 mg daily; not checking BP at home since HCTZ was added  o Current hyperlipidemia regimen: atorvastatin 20 mg daily, LDL at goal <70 on last check o Current antiplatelet regimen: ASA 81 mg daily   Pharmacist Clinical Goal(s):  Marland Kitchen Over the next 90 days, patient will work with PharmD and primary care provider to address optimized medication management  Interventions: . Comprehensive medication review performed, medication list updated in electronic medical record . Inter-disciplinary care team collaboration (see longitudinal plan of care) . Reviewed instruction to increase Ozempic to 0.5 mg after 4 weeks of 0.25 mg dose. Instructed that if low blood sugars develop (fastings <100), to reduce Levemir to 10-12 units daily. Patient verbalized understanding . Reviewed benefit of occasional BP checks at home to ensure maintained goals. He verbalized understanding . Reiterated benefit of ASA and statin for cardiovascular risk reduction.   Patient Self Care Activities:  . Patient will check blood glucose BID-TID, document, and provide at future appointment . Patient will take medications as prescribed . Patient will report any questions or concerns to provider   Please see past updates  related to this goal by clicking on the "Past Updates" button in the selected goal         Patient verbalizes understanding of instructions provided today.    Plan:  - Scheduled f/u call in ~ 8 weeks  Catie Darnelle Maffucci, PharmD, [place] [phone]

## 2019-09-10 ENCOUNTER — Telehealth: Payer: Self-pay | Admitting: General Practice

## 2019-09-10 ENCOUNTER — Ambulatory Visit (INDEPENDENT_AMBULATORY_CARE_PROVIDER_SITE_OTHER): Payer: Medicare HMO | Admitting: General Practice

## 2019-09-10 DIAGNOSIS — I10 Essential (primary) hypertension: Secondary | ICD-10-CM | POA: Diagnosis not present

## 2019-09-10 DIAGNOSIS — Z794 Long term (current) use of insulin: Secondary | ICD-10-CM | POA: Diagnosis not present

## 2019-09-10 DIAGNOSIS — E1159 Type 2 diabetes mellitus with other circulatory complications: Secondary | ICD-10-CM | POA: Diagnosis not present

## 2019-09-10 DIAGNOSIS — E785 Hyperlipidemia, unspecified: Secondary | ICD-10-CM | POA: Diagnosis not present

## 2019-09-10 DIAGNOSIS — E1169 Type 2 diabetes mellitus with other specified complication: Secondary | ICD-10-CM | POA: Diagnosis not present

## 2019-09-10 DIAGNOSIS — I152 Hypertension secondary to endocrine disorders: Secondary | ICD-10-CM

## 2019-09-10 DIAGNOSIS — E1165 Type 2 diabetes mellitus with hyperglycemia: Secondary | ICD-10-CM | POA: Diagnosis not present

## 2019-09-10 NOTE — Patient Instructions (Signed)
Visit Information  Goals Addressed            This Visit's Progress    RNCM: I need help with getting my Diabetes under control       Current Barriers:   Chronic Disease Management support and education needs related to DM, HLD, and HTN  Nurse Case Manager Clinical Goal(s):   Over the next 90 days, patient will verbalize understanding of plan for DM, HLD, and HTN management   Over the next 90 days days, patient will demonstrate a decrease in hyperglycemic exacerbations as evidenced by decreased blood glucose levels and Hemoglobin A1C 14.  Newest hemoglobin A1C 7.3  Over the next 90 days, patient will attend all scheduled medical appointments: next appointment with pcp on 09-19-2019  Over the next 90 days, patient will demonstrate improved adherence to prescribed treatment plan for DM as evidenced by improved signs and symptoms of hyperglycemia, blood sugar levels decreasing <200's, improved Hemoglobin A1C  Over the next 90 days, patient will demonstrate improved health management independence as evidenced by following a heart healthy/ADA Carb modified diet  Over the next 90 days, patient will demonstrate understanding of rationale for each prescribed medication as evidenced by taking medications as prescribed and talking to the pcp before adjusting insulin doses  Interventions:   Evaluation of current treatment plan related to DM, HLD, and HTN and patient's adherence to plan as established by provider.  Advised patient to call pcp for Blood sugar readings <90 or >400.  09-10-2019: the patient states his blood sugars are ranging from 110 to 140.  Is not checking daily but is watching what he eats and staying active.   Provided education to patient re: ADA/ carb modified diet; the patient states he has cut out all sweets. He is trying to watch carbohydrates.  He is not sure if he has lost weight or not but feels great 09-10-2019: Endorses heart healthy/ADA diet. Denies any issues at this  time with dietary restrictions.   Reviewed medications with the patient- co-visit with the CCM pharmacist,  The patient to talk to pcp about the high dose of ASA he is taking daily. Completed  Co-Visit with pharmacist via telephone today for review and assistance with Chronic Disease management and care.  09-10-2019: The patient continues to work with the CCM team at this time.   Discussed plans with patient for ongoing care management follow up and provided patient with direct contact information for care management team  Provided patient and/or caregiver with COVID19 information about community resources- per the patient he will receive his first COVID19 vaccine on 04-24-2019- completed.   Provided patient with foot care educational materials related to slower wound healing in patients with DM and also other complication related to other chronic disease processes of HLD and HTN  Advised patient, providing education and rationale, to check cbg three times a day and record, calling pcp for findings outside established parameters.    Provided the patient with samples of Glucerna with coupons for pick up today at the office. Completed   Patient Self Care Activities:   Patient verbalizes understanding of plan to work with the interdisciplinary team to improve diabetes management and other chronic conditions of HLD and HTN  Self administers medications as prescribed  Attends all scheduled provider appointments  Unable to independently manage Chronic disease processes of DM, HLD, and HTN  Please see past updates related to this goal by clicking on the "Past Updates" button in the selected goal  Patient verbalizes understanding of instructions provided today.   The care management team will reach out to the patient again over the next 60 to 90 days.   Noreene Larsson RN, MSN, [facility] Mobile:  [phone]

## 2019-09-10 NOTE — Chronic Care Management (AMB) (Signed)
Chronic Care Management   Follow Up Note   09/10/2019 Name: Matthew [name] MRN: [id number] DOB: Jul 12, 1945  Referred by: Guadalupe Maple, MD Reason for referral : Chronic Care Management (RNCM Follow up: Chronic Disease Managment and Care coordination Needs)   Matthew [name] is a 74 y.o. year old male who is a primary care patient of Crissman, Jeannette How, MD. The CCM team was consulted for assistance with chronic disease management and care coordination needs.    Review of patient status, including review of consultants reports, relevant laboratory and other test results, and collaboration with appropriate care team members and the patient's provider was performed as part of comprehensive patient evaluation and provision of chronic care management services.    SDOH (Social Determinants of Health) assessments performed: Yes See Care Plan activities for detailed interventions related to [facility])     Outpatient Encounter Medications as of 09/10/2019  Medication Sig Note  . ASPIRIN EC PO Take 81 mg by mouth daily.    Marland Kitchen atorvastatin (LIPITOR) 20 MG tablet Take 1 tablet (20 mg total) by mouth daily.   . benazepril (LOTENSIN) 40 MG tablet Take 1 tablet (40 mg total) by mouth daily.   . hydrochlorothiazide (MICROZIDE) 12.5 MG capsule Take 1 capsule (12.5 mg total) by mouth daily.   . Insulin Detemir (LEVEMIR) 100 UNIT/ML Pen Inject 10 Units into the skin at bedtime. 08/08/2019: 15 units  . Insulin Pen Needle 31G X 8 MM MISC Use one needle to administer insulin into skin nightly.   . metFORMIN (GLUCOPHAGE) 500 MG tablet Take 2 tablets (1,000 mg total) by mouth 2 (two) times daily with a meal.   . Multiple Vitamins-Minerals (CENTRUM SILVER 50+MEN) TABS Take by mouth.   . Semaglutide,0.25 or 0.5MG /DOS, (OZEMPIC, 0.25 OR 0.5 MG/DOSE,) 2 MG/1.5ML SOPN Inject 0.25 mg into the skin once a week.    No facility-administered encounter medications on file as of 09/10/2019.     Objective:   Goals Addressed             This Visit's Progress   . RNCM: I need help with getting my Diabetes under control       Current Barriers:  . Chronic Disease Management support and education needs related to DM, HLD, and HTN  Nurse Case Manager Clinical Goal(s):  Marland Kitchen Over the next 90 days, patient will verbalize understanding of plan for DM, HLD, and HTN management  . Over the next 90 days days, patient will demonstrate a decrease in hyperglycemic exacerbations as evidenced by decreased blood glucose levels and Hemoglobin A1C 14.  Newest hemoglobin A1C 7.3 . Over the next 90 days, patient will attend all scheduled medical appointments: next appointment with pcp on 09-19-2019 . Over the next 90 days, patient will demonstrate improved adherence to prescribed treatment plan for DM as evidenced by improved signs and symptoms of hyperglycemia, blood sugar levels decreasing <200's, improved Hemoglobin A1C . Over the next 90 days, patient will demonstrate improved health management independence as evidenced by following a heart healthy/ADA Carb modified diet . Over the next 90 days, patient will demonstrate understanding of rationale for each prescribed medication as evidenced by taking medications as prescribed and talking to the pcp before adjusting insulin doses  Interventions:  . Evaluation of current treatment plan related to DM, HLD, and HTN and patient's adherence to plan as established by provider. . Advised patient to call pcp for Blood sugar readings <90 or >400.  09-10-2019: the patient states his  blood sugars are ranging from 110 to 140.  Is not checking daily but is watching what he eats and staying active.  . Provided education to patient re: ADA/ carb modified diet; the patient states he has cut out all sweets. He is trying to watch carbohydrates.  He is not sure if he has lost weight or not but feels great 09-10-2019: Endorses heart healthy/ADA diet. Denies any issues at this time with dietary restrictions.   . Reviewed medications with the patient- co-visit with the CCM pharmacist,  The patient to talk to pcp about the high dose of ASA he is taking daily. Completed . Co-Visit with pharmacist via telephone today for review and assistance with Chronic Disease management and care.  09-10-2019: The patient continues to work with the CCM team at this time.  . Discussed plans with patient for ongoing care management follow up and provided patient with direct contact information for care management team . Provided patient and/or caregiver with COVID19 information about community resources- per the patient he will receive his first COVID19 vaccine on 04-24-2019- completed.  . Provided patient with foot care educational materials related to slower wound healing in patients with DM and also other complication related to other chronic disease processes of HLD and HTN . Advised patient, providing education and rationale, to check cbg three times a day and record, calling pcp for findings outside established parameters.   . Provided the patient with samples of Glucerna with coupons for pick up today at the office. Completed   Patient Self Care Activities:  . Patient verbalizes understanding of plan to work with the interdisciplinary team to improve diabetes management and other chronic conditions of HLD and HTN . Self administers medications as prescribed . Attends all scheduled provider appointments . Unable to independently manage Chronic disease processes of DM, HLD, and HTN  Please see past updates related to this goal by clicking on the "Past Updates" button in the selected goal          Plan:   The care management team will reach out to the patient again over the next 60 to 90 days.    Noreene Larsson RN, MSN, [facility] Mobile: [phone]

## 2019-09-19 ENCOUNTER — Encounter: Payer: Self-pay | Admitting: Family Medicine

## 2019-09-19 ENCOUNTER — Ambulatory Visit (INDEPENDENT_AMBULATORY_CARE_PROVIDER_SITE_OTHER): Payer: Medicare HMO | Admitting: Family Medicine

## 2019-09-19 ENCOUNTER — Other Ambulatory Visit: Payer: Self-pay

## 2019-09-19 VITALS — BP 126/69 | HR 83 | Temp 97.9°F | Wt 232.0 lb

## 2019-09-19 DIAGNOSIS — E7800 Pure hypercholesterolemia, unspecified: Secondary | ICD-10-CM | POA: Diagnosis not present

## 2019-09-19 DIAGNOSIS — E785 Hyperlipidemia, unspecified: Secondary | ICD-10-CM

## 2019-09-19 DIAGNOSIS — E1169 Type 2 diabetes mellitus with other specified complication: Secondary | ICD-10-CM

## 2019-09-19 DIAGNOSIS — Z794 Long term (current) use of insulin: Secondary | ICD-10-CM

## 2019-09-19 DIAGNOSIS — I152 Hypertension secondary to endocrine disorders: Secondary | ICD-10-CM

## 2019-09-19 DIAGNOSIS — E119 Type 2 diabetes mellitus without complications: Secondary | ICD-10-CM | POA: Diagnosis not present

## 2019-09-19 DIAGNOSIS — E1165 Type 2 diabetes mellitus with hyperglycemia: Secondary | ICD-10-CM | POA: Diagnosis not present

## 2019-09-19 DIAGNOSIS — E1159 Type 2 diabetes mellitus with other circulatory complications: Secondary | ICD-10-CM | POA: Diagnosis not present

## 2019-09-19 DIAGNOSIS — I10 Essential (primary) hypertension: Secondary | ICD-10-CM

## 2019-09-19 DIAGNOSIS — N400 Enlarged prostate without lower urinary tract symptoms: Secondary | ICD-10-CM

## 2019-09-19 MED ORDER — HYDROCHLOROTHIAZIDE 12.5 MG PO CAPS: 12.5000 mg | ORAL_CAPSULE | Freq: Every day | ORAL | 1 refills | Status: DC

## 2019-09-19 MED ORDER — OZEMPIC (0.25 OR 0.5 MG/DOSE) 2 MG/1.5ML ~~LOC~~ SOPN: 0.5000 mg | PEN_INJECTOR | SUBCUTANEOUS | 2 refills | Status: DC

## 2019-09-19 MED ORDER — INSULIN DETEMIR 100 UNIT/ML FLEXPEN: 15.0000 [IU] | PEN_INJECTOR | Freq: Every day | SUBCUTANEOUS | 2 refills | Status: DC

## 2019-09-19 MED ORDER — BENAZEPRIL HCL 40 MG PO TABS: 40.0000 mg | ORAL_TABLET | Freq: Every day | ORAL | 1 refills | Status: DC

## 2019-09-19 MED ORDER — METFORMIN HCL 1000 MG PO TABS: 1000.0000 mg | ORAL_TABLET | Freq: Two times a day (BID) | ORAL | 1 refills | Status: DC

## 2019-09-19 MED ORDER — ATORVASTATIN CALCIUM 20 MG PO TABS: 20.0000 mg | ORAL_TABLET | Freq: Every day | ORAL | 1 refills | Status: DC

## 2019-09-19 NOTE — Assessment & Plan Note (Signed)
Recheck A1C, adjust as needed. Discussed at length importance of tight BS control and close monitoring of home BSs particularly fasting in AM given insulin use. Reviewed up titration of levemir prn based on what these fasting readings look like. May increase ozempic to 1 mg dose weekly if A1C too high. Diet and exercise changes reviewed again

## 2019-09-19 NOTE — Progress Notes (Signed)
BP 126/69   Pulse 83   Temp 97.9 F (36.6 C) (Oral)   Wt (!) 232 lb (105.2 kg)   SpO2 96%   BMI 33.29 kg/m    Subjective:    Patient ID: Matthew [name], male    DOB: 1945-12-12, 75 y.o.   MRN: [id number]  HPI: Matthew [name] is a 74 y.o. male  Chief Complaint  Patient presents with  . Diabetes  . Hypertension  . Hyperlipidemia  . Edema    leg   Here today for chronic condition f/u.   DM - Checking home sugars occasionally - 114 - 200s random. Went up to 0.5 mg ozempic dose the past few weeks which he's tolerating well. Taking about 15 units of levemir nightly. Denies low blood sugar episodes, lowest reading he's gotten is the 114. Has not been careful with diet especially the last 6 weeks while traveling. Does not exercise.   HTN - added hctz last visit to help with LE edema and notes great benefit with that. Does not check home BPs. Tolerating regimen well without side effects. Denies CP, SOB, HAs, dizziness.   HLD - on lipitor, denies myalgias, claudication.   BPH - asymptomatic at this time  Relevant past medical, surgical, family and social history reviewed and updated as indicated. Interim medical history since our last visit reviewed. Allergies and medications reviewed and updated.  Review of Systems  Per HPI unless specifically indicated above     Objective:    BP 126/69   Pulse 83   Temp 97.9 F (36.6 C) (Oral)   Wt (!) 232 lb (105.2 kg)   SpO2 96%   BMI 33.29 kg/m   Wt Readings from Last 3 Encounters:  09/19/19 (!) 232 lb (105.2 kg)  06/16/19 236 lb (107 kg)  06/16/19 236 lb (107 kg)    Physical Exam Vitals and nursing note reviewed.  Constitutional:      Appearance: Normal appearance.  HENT:     Head: Atraumatic.  Eyes:     Extraocular Movements: Extraocular movements intact.     Conjunctiva/sclera: Conjunctivae normal.  Cardiovascular:     Rate and Rhythm: Normal rate and regular rhythm.  Pulmonary:     Effort: Pulmonary effort is  normal.     Breath sounds: Normal breath sounds.  Musculoskeletal:        General: No swelling or tenderness. Normal range of motion.     Cervical back: Normal range of motion and neck supple.  Skin:    General: Skin is warm and dry.  Neurological:     General: No focal deficit present.     Mental Status: He is oriented to person, place, and time.  Psychiatric:        Mood and Affect: Mood normal.        Thought Content: Thought content normal.        Judgment: Judgment normal.     Results for orders placed or performed in visit on 06/16/19  HgB A1c  Result Value Ref Range   Hgb A1c MFr Bld 7.3 (H) 4.8 - 5.6 %   Est. average glucose Bld gHb Est-mCnc 163 mg/dL      Assessment & Plan:   Problem List Items Addressed This Visit      Cardiovascular and Mediastinum   Hypertension associated with diabetes ([place]) - Primary    BPs stable and under good control, continue benazepril and HCTZ regimen. DASH diet, exercise reviewed  Relevant Medications   Semaglutide,0.25 or 0.5MG /DOS, (OZEMPIC, 0.25 OR 0.5 MG/DOSE,) 2 MG/1.5ML SOPN   metFORMIN (GLUCOPHAGE) 1000 MG tablet   insulin detemir (LEVEMIR) 100 UNIT/ML FlexPen   hydrochlorothiazide (MICROZIDE) 12.5 MG capsule   benazepril (LOTENSIN) 40 MG tablet   atorvastatin (LIPITOR) 20 MG tablet   Other Relevant Orders   Comprehensive metabolic panel     Endocrine   Type 2 diabetes mellitus with hyperglycemia (HCC)    Recheck A1C, adjust as needed. Discussed at length importance of tight BS control and close monitoring of home BSs particularly fasting in AM given insulin use. Reviewed up titration of levemir prn based on what these fasting readings look like. May increase ozempic to 1 mg dose weekly if A1C too high. Diet and exercise changes reviewed again      Relevant Medications   Semaglutide,0.25 or 0.5MG /DOS, (OZEMPIC, 0.25 OR 0.5 MG/DOSE,) 2 MG/1.5ML SOPN   metFORMIN (GLUCOPHAGE) 1000 MG tablet   insulin detemir (LEVEMIR)  100 UNIT/ML FlexPen   benazepril (LOTENSIN) 40 MG tablet   atorvastatin (LIPITOR) 20 MG tablet   Other Relevant Orders   HgB A1c   Hyperlipidemia associated with type 2 diabetes mellitus (HCC)    Recheck lipids, adjust as needed, continue lipitor regimen      Relevant Medications   Semaglutide,0.25 or 0.5MG /DOS, (OZEMPIC, 0.25 OR 0.5 MG/DOSE,) 2 MG/1.5ML SOPN   metFORMIN (GLUCOPHAGE) 1000 MG tablet   insulin detemir (LEVEMIR) 100 UNIT/ML FlexPen   benazepril (LOTENSIN) 40 MG tablet   atorvastatin (LIPITOR) 20 MG tablet   Other Relevant Orders   Lipid Panel w/o Chol/HDL Ratio     Genitourinary   BPH (benign prostatic hyperplasia)    Recheck PSA, continue to monitor. Asymptomatic      Relevant Orders   PSA    Other Visit Diagnoses    Diabetes mellitus without complication ([place])       Relevant Medications   Semaglutide,0.25 or 0.5MG /DOS, (OZEMPIC, 0.25 OR 0.5 MG/DOSE,) 2 MG/1.5ML SOPN   metFORMIN (GLUCOPHAGE) 1000 MG tablet   insulin detemir (LEVEMIR) 100 UNIT/ML FlexPen   benazepril (LOTENSIN) 40 MG tablet   atorvastatin (LIPITOR) 20 MG tablet   Essential hypertension       Relevant Medications   hydrochlorothiazide (MICROZIDE) 12.5 MG capsule   benazepril (LOTENSIN) 40 MG tablet   atorvastatin (LIPITOR) 20 MG tablet   Pure hypercholesterolemia       Relevant Medications   hydrochlorothiazide (MICROZIDE) 12.5 MG capsule   benazepril (LOTENSIN) 40 MG tablet   atorvastatin (LIPITOR) 20 MG tablet       Follow up plan: Return in about 6 months (around 03/21/2020) for 6 month f/u.

## 2019-09-19 NOTE — Assessment & Plan Note (Signed)
Recheck PSA, continue to monitor. Asymptomatic

## 2019-09-19 NOTE — Assessment & Plan Note (Signed)
Recheck lipids, adjust as needed, continue lipitor regimen

## 2019-09-19 NOTE — Assessment & Plan Note (Signed)
BPs stable and under good control, continue benazepril and HCTZ regimen. DASH diet, exercise reviewed

## 2019-09-20 LAB — COMPREHENSIVE METABOLIC PANEL
ALT: 23 IU/L (ref 0–44)
AST: 20 IU/L (ref 0–40)
Albumin/Globulin Ratio: 1.9 (ref 1.2–2.2)
Albumin: 4.6 g/dL (ref 3.7–4.7)
Alkaline Phosphatase: 75 IU/L (ref 48–121)
BUN/Creatinine Ratio: 17 (ref 10–24)
BUN: 25 mg/dL (ref 8–27)
Bilirubin Total: <0.2 mg/dL (ref 0.0–1.2)
CO2: 22 mmol/L (ref 20–29)
Calcium: 9.5 mg/dL (ref 8.6–10.2)
Chloride: 102 mmol/L (ref 96–106)
Creatinine, Ser: 1.5 mg/dL — ABNORMAL HIGH (ref 0.76–1.27)
GFR calc Af Amer: 53 mL/min/{1.73_m2} — ABNORMAL LOW (ref >59)
GFR calc non Af Amer: 46 mL/min/{1.73_m2} — ABNORMAL LOW (ref >59)
Globulin, Total: 2.4 g/dL (ref 1.5–4.5)
Glucose: 198 mg/dL — ABNORMAL HIGH (ref 65–99)
Potassium: 5.2 mmol/L (ref 3.5–5.2)
Sodium: 136 mmol/L (ref 134–144)
Total Protein: 7 g/dL (ref 6.0–8.5)

## 2019-09-20 LAB — LIPID PANEL W/O CHOL/HDL RATIO
Cholesterol, Total: 133 mg/dL (ref 100–199)
HDL: 44 mg/dL (ref >39)
LDL Chol Calc (NIH): 73 mg/dL (ref 0–99)
Triglycerides: 84 mg/dL (ref 0–149)
VLDL Cholesterol Cal: 16 mg/dL (ref 5–40)

## 2019-09-20 LAB — PSA: Prostate Specific Ag, Serum: 1 ng/mL (ref 0.0–4.0)

## 2019-09-20 LAB — HEMOGLOBIN A1C
Est. average glucose Bld gHb Est-mCnc: 169 mg/dL
Hgb A1c MFr Bld: 7.5 % — ABNORMAL HIGH (ref 4.8–5.6)

## 2019-10-15 ENCOUNTER — Ambulatory Visit: Payer: Self-pay | Admitting: Pharmacist

## 2019-10-15 DIAGNOSIS — E1165 Type 2 diabetes mellitus with hyperglycemia: Secondary | ICD-10-CM

## 2019-10-15 DIAGNOSIS — I152 Hypertension secondary to endocrine disorders: Secondary | ICD-10-CM

## 2019-10-15 DIAGNOSIS — E1159 Type 2 diabetes mellitus with other circulatory complications: Secondary | ICD-10-CM

## 2019-10-15 DIAGNOSIS — Z794 Long term (current) use of insulin: Secondary | ICD-10-CM

## 2019-10-15 NOTE — Chronic Care Management (AMB) (Signed)
Chronic Care Management   Follow Up Note   10/15/2019 Name: Matthew [name] MRN: [id number] DOB: [birth year]  Referred by: Guadalupe Maple, MD Reason for referral : No chief complaint on file.   Matthew [name] is a 74 y.o. year old male who is a primary care patient of Crissman, Jeannette How, MD. The CCM team was consulted for assistance with chronic disease management and care coordination needs.    Review of patient status, including review of consultants reports, relevant laboratory and other test results, and collaboration with appropriate care team members and the patient's provider was performed as part of comprehensive patient evaluation and provision of chronic care management services.    SDOH (Social Determinants of Health) assessments performed: Yes See Care Plan activities for detailed interventions related to [facility])     Outpatient Encounter Medications as of 10/15/2019  Medication Sig  . ASPIRIN EC PO Take 81 mg by mouth daily.   Marland Kitchen atorvastatin (LIPITOR) 20 MG tablet Take 1 tablet (20 mg total) by mouth daily.  . benazepril (LOTENSIN) 40 MG tablet Take 1 tablet (40 mg total) by mouth daily.  . insulin detemir (LEVEMIR) 100 UNIT/ML FlexPen Inject 15-20 Units into the skin at bedtime.  . Insulin Pen Needle 31G X 8 MM MISC Use one needle to administer insulin into skin nightly.  . metFORMIN (GLUCOPHAGE) 1000 MG tablet Take 1 tablet (1,000 mg total) by mouth 2 (two) times daily with a meal.  . Multiple Vitamins-Minerals (CENTRUM SILVER 50+MEN) TABS Take by mouth.  . Semaglutide,0.25 or 0.5MG/DOS, (OZEMPIC, 0.25 OR 0.5 MG/DOSE,) 2 MG/1.5ML SOPN Inject 0.375 mLs (0.5 mg total) into the skin once a week.  . hydrochlorothiazide (MICROZIDE) 12.5 MG capsule Take 1 capsule (12.5 mg total) by mouth daily. (Patient not taking: Reported on 10/15/2019)   No facility-administered encounter medications on file as of 10/15/2019.     Objective:   Goals Addressed              This Visit's  Progress   .  PharmD "I want to stay healthy" (pt-stated)        CARE PLAN ENTRY (see longtitudinal plan of care for additional care plan information)  Current Barriers:  . Diabetes: uncontrolled but improved per glucose readings; complicated by chronic medical conditions including HTN, HLD, most recent A1c 7.3% . Most recent eGFR: ~44 mL . Current antihyperglycemic regimen: metformin 1000 mg BID, Levemir 20 units daily; Ozempic 0.5 mg weekly . Current blood glucose readings:  o Not checking [place] and did not take meter. o Reports BG ranges from 116-180 fasting and post prandial. Randomly checking unless BG 160-170 then checks consistently for a couple days. . Cardiovascular risk reduction: o Current hypertensive regimen: benazepril 40 mg daily ; never started HCTZ  stating has never had a problem with BP,not checking BP at  o Current hyperlipidemia regimen: atorvastatin 20 mg daily, LDL at goal <70 on last check o Current antiplatelet regimen: ASA 81 mg daily   Pharmacist Clinical Goal(s):  Marland Kitchen Over the next 90 days, patient will work with PharmD and primary care provider to address optimized medication management  Interventions: . Comprehensive medication review performed, medication list updated in electronic medical record . Inter-disciplinary care team collaboration (see longitudinal plan of care) . Verified Ozempic dose has been increased to 0.5 mg weekly. Instructed that if low blood sugars develop (fastings <100), Patient verbalized understanding . Reviewed benefit of occasional BP checks  at home to ensure maintained goals. He verbalized understanding . Reiterated benefit of ASA and statin for cardiovascular risk reduction.  . Patient states he will be in Friday morning for his 2nd dose of shingles vaccine. He requests a sample of Ozempic if available. States he will not qualify for patient assistance income and can pay $100 copay.  Patient Self  Care Activities:  . Patient will check blood glucose BID-TID, document, and provide at future appointment . Patient will take medications as prescribed . Patient will report any questions or concerns to provider   Please see past updates related to this goal by clicking on the "Past Updates" button in the selected goal          Plan:   Telephone follow up appointment with care management team member scheduled for: 8 -12 weeks.   Julie S. Harris PharmD, BCPS Clinical Pharmacist [facility] [phone]    

## 2019-10-15 NOTE — Patient Instructions (Addendum)
Visit Information  It was a pleasure speaking with you today. Thank you for letting me be part of your clinical team. Please call with any questions or concerns.   Goals Addressed              This Visit's Progress   .  PharmD "I want to stay healthy" (pt-stated)        CARE PLAN ENTRY (see longtitudinal plan of care for additional care plan information)  Current Barriers:  . Diabetes: uncontrolled but improved per glucose readings; complicated by chronic medical conditions including HTN, HLD, most recent A1c 7.3% . Most recent eGFR: ~44 mL . Current antihyperglycemic regimen: metformin 1000 mg BID, Levemir 20 units daily; Ozempic 0.5 mg weekly . Current blood glucose readings:  o Not checking [place] and did not [place] meter. o Reports BG ranges from 116-180 fasting and post prandial. Randomly checking unless BG 160-170 then checks consistently for a couple days. . Cardiovascular risk reduction: o Current hypertensive regimen: benazepril 40 mg daily ; never started HCTZ  stating has never had a problem with BP,not checking BP at  o Current hyperlipidemia regimen: atorvastatin 20 mg daily, LDL at goal <70 on last check o Current antiplatelet regimen: ASA 81 mg daily   Pharmacist Clinical Goal(s):  Marland Kitchen Over the next 90 days, patient will work with PharmD and primary care provider to address optimized medication management  Interventions: . Comprehensive medication review performed, medication list updated in electronic medical record . Inter-disciplinary care team collaboration (see longitudinal plan of care) . Verified Ozempic dose has been increased to 0.5 mg weekly. Instructed that if low blood sugars develop (fastings <100), Patient verbalized understanding . Reviewed benefit of occasional BP checks at home to ensure maintained goals. He verbalized understanding . Reiterated benefit of ASA and statin for cardiovascular risk reduction.  . Patient  states he will be in Friday morning for his 2nd dose of shingles vaccine. He requests a sample of Ozempic if available. States he will not qualify for patient assistance income and can pay $100 copay.  Patient Self Care Activities:  . Patient will check blood glucose BID-TID, document, and provide at future appointment . Patient will take medications as prescribed . Patient will report any questions or concerns to provider   Please see past updates related to this goal by clicking on the "Past Updates" button in the selected goal         The patient verbalized understanding of instructions provided today and agreed to receive a mailed copy of patient instruction and/or educational materials.  Telephone follow up appointment with pharmacy team member scheduled for: 12/17/19 at 2 pm  Junita Push. Sabri Teal PharmD, BCPS Clinical Pharmacist [phone]  DASH Eating Plan DASH stands for "Dietary Approaches to Stop Hypertension." The DASH eating plan is a healthy eating plan that has been shown to reduce high blood pressure (hypertension). It may also reduce your risk for type 2 diabetes, heart disease, and stroke. The DASH eating plan may also help with weight loss. What are tips for following this plan?  General guidelines  Avoid eating more than 2,300 mg (milligrams) of salt (sodium) a day. If you have hypertension, you may need to reduce your sodium intake to 1,500 mg a day.  Limit alcohol intake to no more than 1 drink a day for nonpregnant women and 2 drinks a day for men. One drink equals 12 oz of beer, 5 oz of wine,  or 1 oz of hard liquor.  Work with your health care provider to maintain a healthy body weight or to lose weight. Ask what an ideal weight is for you.  Get at least 30 minutes of exercise that causes your heart to beat faster (aerobic exercise) most days of the week. Activities may include walking, swimming, or biking.  Work with your health care provider or diet and  nutrition specialist (dietitian) to adjust your eating plan to your individual calorie needs. Reading food labels   Check food labels for the amount of sodium per serving. Choose foods with less than 5 percent of the Daily Value of sodium. Generally, foods with less than 300 mg of sodium per serving fit into this eating plan.  To find whole grains, look for the word "whole" as the first word in the ingredient list. Shopping  Buy products labeled as "low-sodium" or "no salt added."  Buy fresh foods. Avoid canned foods and premade or frozen meals. Cooking  Avoid adding salt when cooking. Use salt-free seasonings or herbs instead of table salt or sea salt. Check with your health care provider or pharmacist before using salt substitutes.  Do not fry foods. Cook foods using healthy methods such as baking, boiling, grilling, and broiling instead.  Cook with heart-healthy oils, such as olive, canola, soybean, or sunflower oil. Meal planning  Eat a balanced diet that includes: ? 5 or more servings of fruits and vegetables each day. At each meal, try to fill half of your plate with fruits and vegetables. ? Up to 6-8 servings of whole grains each day. ? Less than 6 oz of lean meat, poultry, or fish each day. A 3-oz serving of meat is about the same size as a deck of cards. One egg equals 1 oz. ? 2 servings of low-fat dairy each day. ? A serving of nuts, seeds, or beans 5 times each week. ? Heart-healthy fats. Healthy fats called Omega-3 fatty acids are found in foods such as flaxseeds and coldwater fish, like sardines, salmon, and mackerel.  Limit how much you eat of the following: ? Canned or prepackaged foods. ? Food that is high in trans fat, such as fried foods. ? Food that is high in saturated fat, such as fatty meat. ? Sweets, desserts, sugary drinks, and other foods with added sugar. ? Full-fat dairy products.  Do not salt foods before eating.  Try to eat at least 2 vegetarian  meals each week.  Eat more home-cooked food and less restaurant, buffet, and fast food.  When eating at a restaurant, ask that your food be prepared with less salt or no salt, if possible. What foods are recommended? The items listed may not be a complete list. Talk with your dietitian about what dietary choices are best for you. Grains Whole-grain or whole-wheat bread. Whole-grain or whole-wheat pasta. Brown rice. [place] Morrow. Bulgur. Whole-grain and low-sodium cereals. Pita bread. Low-fat, low-sodium crackers. Whole-wheat flour tortillas. Vegetables Fresh or frozen vegetables (raw, steamed, roasted, or grilled). Low-sodium or reduced-sodium tomato and vegetable juice. Low-sodium or reduced-sodium tomato sauce and tomato paste. Low-sodium or reduced-sodium canned vegetables. Fruits All fresh, dried, or frozen fruit. Canned fruit in natural juice (without added sugar). Meat and other protein foods Skinless chicken or [place]. Ground chicken or [place]. Pork with fat trimmed off. Fish and seafood. Egg whites. Dried beans, peas, or lentils. Unsalted nuts, nut butters, and seeds. Unsalted canned beans. Lean cuts of beef with fat trimmed off. Low-sodium, lean deli  meat. Dairy Low-fat (1%) or fat-free (skim) milk. Fat-free, low-fat, or reduced-fat cheeses. Nonfat, low-sodium ricotta or cottage cheese. Low-fat or nonfat yogurt. Low-fat, low-sodium cheese. Fats and oils Soft margarine without trans fats. Vegetable oil. Low-fat, reduced-fat, or light mayonnaise and salad dressings (reduced-sodium). Canola, safflower, olive, soybean, and sunflower oils. Avocado. Seasoning and other foods Herbs. Spices. Seasoning mixes without salt. Unsalted popcorn and pretzels. Fat-free sweets. What foods are not recommended? The items listed may not be a complete list. Talk with your dietitian about what dietary choices are best for you. Grains Baked goods made with fat, such as croissants, muffins, or some  breads. Dry pasta or rice meal packs. Vegetables Creamed or fried vegetables. Vegetables in a cheese sauce. Regular canned vegetables (not low-sodium or reduced-sodium). Regular canned tomato sauce and paste (not low-sodium or reduced-sodium). Regular tomato and vegetable juice (not low-sodium or reduced-sodium). Angie Fava. Olives. Fruits Canned fruit in a light or heavy syrup. Fried fruit. Fruit in cream or butter sauce. Meat and other protein foods Fatty cuts of meat. Ribs. Fried meat. Berniece Salines. Sausage. Bologna and other processed lunch meats. Salami. Fatback. Hotdogs. Bratwurst. Salted nuts and seeds. Canned beans with added salt. Canned or smoked fish. Whole eggs or egg yolks. Chicken or [place] with skin. Dairy Whole or 2% milk, cream, and half-and-half. Whole or full-fat cream cheese. Whole-fat or sweetened yogurt. Full-fat cheese. Nondairy creamers. Whipped toppings. Processed cheese and cheese spreads. Fats and oils Butter. Stick margarine. Lard. Shortening. Ghee. Bacon fat. Tropical oils, such as coconut, palm kernel, or palm oil. Seasoning and other foods Salted popcorn and pretzels. Onion salt, garlic salt, seasoned salt, table salt, and sea salt. Worcestershire sauce. Tartar sauce. Barbecue sauce. Teriyaki sauce. Soy sauce, including reduced-sodium. Steak sauce. Canned and packaged gravies. Fish sauce. Oyster sauce. Cocktail sauce. Horseradish that you find on the shelf. Ketchup. Mustard. Meat flavorings and tenderizers. Bouillon cubes. Hot sauce and Tabasco sauce. Premade or packaged marinades. Premade or packaged taco seasonings. Relishes. Regular salad dressings. Where to find more information:  National Heart, Lung, and [place]: https://wilson-eaton.com/  American Heart Association: www.heart.org Summary  The DASH eating plan is a healthy eating plan that has been shown to reduce high blood pressure (hypertension). It may also reduce your risk for type 2 diabetes, heart disease, and  stroke.  With the DASH eating plan, you should limit salt (sodium) intake to 2,300 mg a day. If you have hypertension, you may need to reduce your sodium intake to 1,500 mg a day.  When on the DASH eating plan, aim to eat more fresh fruits and vegetables, whole grains, lean proteins, low-fat dairy, and heart-healthy fats.  Work with your health care provider or diet and nutrition specialist (dietitian) to adjust your eating plan to your individual calorie needs. This information is not intended to replace advice given to you by your health care provider. Make sure you discuss any questions you have with your health care provider. Document Revised: 01/26/2017 Document Reviewed: 02/07/2016 Elsevier Patient Education  2020 [employer].

## 2019-10-17 ENCOUNTER — Ambulatory Visit (INDEPENDENT_AMBULATORY_CARE_PROVIDER_SITE_OTHER): Payer: Medicare HMO

## 2019-10-17 ENCOUNTER — Telehealth: Payer: Self-pay

## 2019-10-17 ENCOUNTER — Other Ambulatory Visit: Payer: Self-pay

## 2019-10-17 DIAGNOSIS — Z23 Encounter for immunization: Secondary | ICD-10-CM | POA: Diagnosis not present

## 2019-10-17 NOTE — Telephone Encounter (Signed)
Patient requested Ozempic sample. Merrie Roof. PA-C approved # 1

## 2019-11-04 ENCOUNTER — Other Ambulatory Visit: Payer: Self-pay | Admitting: Family Medicine

## 2019-11-12 ENCOUNTER — Telehealth: Payer: Self-pay | Admitting: General Practice

## 2019-11-12 ENCOUNTER — Telehealth: Payer: Self-pay

## 2019-11-12 NOTE — Telephone Encounter (Signed)
°  Chronic Care Management   Outreach Note  11/12/2019 Name: Matthew [name] MRN: [id number] DOB: 10-28-1945  Referred by: Guadalupe Maple, MD Reason for referral : Chronic Care Management (RNCM Follow up call for chronic disease management and care coordination needs)   An unsuccessful telephone outreach was attempted today. The patient was referred to the case management team for assistance with care management and care coordination.   Follow Up Plan: A HIPAA compliant phone message was left for the patient providing contact information and requesting a return call.   Noreene Larsson RN, MSN, [facility] Mobile: [phone]

## 2019-11-18 ENCOUNTER — Telehealth: Payer: Self-pay

## 2019-11-18 NOTE — Chronic Care Management (AMB) (Signed)
  Care Management   Note  11/18/2019 Name: Matthew [name] MRN: [id number] DOB: February 02, 1946  Matthew [name] is a 74 y.o. year old male who is a primary care patient of Crissman, Jeannette How, MD and is actively engaged with the care management team. I reached out to Matthew [name] by phone today to assist with re-scheduling a follow up visit with the RN Case Manager  Follow up plan: Unsuccessful telephone outreach attempt made. A HIPAA compliant phone message was left for the patient providing contact information and requesting a return call.  The care management team will reach out to the patient again over the next 7 days.  If patient returns call to provider office, please advise to call [facility]  at [place], [place], [place], [place] [address] Direct Dial: [phone] [email or web address] Website: [email or web address]

## 2019-11-18 NOTE — Telephone Encounter (Signed)
Pt has been r/s  

## 2019-11-18 NOTE — Chronic Care Management (AMB) (Signed)
°  Care Management   Note  11/18/2019 Name: FILLMORE BYNUM MRN: [id number] DOB: [birth year]  [name] is a 74 y.o. year old male who is a primary care patient of Crissman, Jeannette How, MD and is actively engaged with the care management team. I reached out to [name] by phone today to assist with re-scheduling a follow up visit with the RN Case Manager  Follow up plan: Telephone appointment with care management team member scheduled for:  Noreene Larsson, [employer], [place], [place] [address] Direct Dial: [phone] [email or web address]: [email or web address]

## 2019-12-08 DIAGNOSIS — R69 Illness, unspecified: Secondary | ICD-10-CM | POA: Diagnosis not present

## 2019-12-17 ENCOUNTER — Telehealth: Payer: Self-pay

## 2020-01-14 ENCOUNTER — Ambulatory Visit: Payer: Self-pay | Admitting: Pharmacist

## 2020-01-14 DIAGNOSIS — E119 Type 2 diabetes mellitus without complications: Secondary | ICD-10-CM

## 2020-01-14 DIAGNOSIS — I10 Essential (primary) hypertension: Secondary | ICD-10-CM

## 2020-01-14 NOTE — Patient Instructions (Addendum)
Visit Information  It was a pleasure speaking with you today. Thank you for letting me be part of your clinical team. Please call with any questions or concerns.   Goals Addressed            This Visit's Progress   . Pharmacy Care Plan       CARE PLAN ENTRY (see longitudinal plan of care for additional care plan information)  Current Barriers:  . Chronic Disease Management support, education, and care coordination needs related to Hypertension, Hyperlipidemia, Diabetes, and BPH   Hypertension BP Readings from Last 3 Encounters:  09/19/19 126/69  06/16/19 132/87  06/16/19 132/87   . Pharmacist Clinical Goal(s): o Over the next 90 days, patient will work with PharmD and providers to maintain BP goal <130/80 . Current regimen:  o Benazepril 40 mg qd . Interventions: o Provided diet and exercise counseling. o Reviewed fill history in computer o Encouraged patient to check BP at home to ensure adequate control . Patient self care activities - Over the next 90 days, patient will: o Check BP weekly, document, and provide at future appointments o Ensure daily salt intake < 2300 mg/day  Hyperlipidemia Lab Results  Component Value Date/Time   LDLCALC 73 09/19/2019 09:16 AM   . Pharmacist Clinical Goal(s): o Over the next 90 days, patient will work with PharmD and providers to maintain LDL goal < 100 . Current regimen:  o Atorvastatin 20 mg qd . Interventions: o Provided diet and exercise counseling. . Patient self care activities - Over the next 90 days, patient will: o Continue focus on DASH diet and incorporate structured exercise program with a goal of 30 min/day 5 days/week  Diabetes Lab Results  Component Value Date/Time   HGBA1C 7.5 (H) 09/19/2019 09:16 AM   HGBA1C 7.3 (H) 06/16/2019 09:22 AM   HGBA1C >14.0 (H) 03/19/2019 04:11 PM   HGBA1C 6.8 08/08/2017 10:25 AM   . Pharmacist Clinical Goal(s): o Over the next 90 days, patient will work with PharmD and providers  to achieve A1c goal <7% . Current regimen:  . Ozempic 0.5mg  weekly . Metformin 1000mg  bid . Levemir 20 units daily . Interventions: o Provided diet and exercise counseling. o Reviewed goal glucose readings for an A1c of <7%, we want to see fasting sugars <130 and 2 hour after meal sugars <180.  o Reviewed patient assistance eligibility for Ozempic. Patient states income within limits now that his wife has retired. Will assist in applying . Patient self care activities - Over the next 90 days, patient will: o Check blood sugar 2-3 times weekly document, and provide at future appointments o Contact provider with any episodes of hypoglycemia o Provide required portion of PAP documentation.  Medication management . Pharmacist Clinical Goal(s): o Over the next 90 days, patient will work with PharmD and providers to achieve optimal medication adherence . Current pharmacy: [facility] . Interventions o Comprehensive medication review performed. o Continue current medication management strategy . Patient self care activities - Over the next 90 days, patient will: o Focus on medication adherence by fill dates o Take medications as prescribed o Report any questions or concerns to PharmD and/or provider(s)  Initial goal documentation        The patient verbalized understanding of instructions, educational materials, and care plan provided today and agreed to receive a mailed copy of patient instructions, educational materials, and care plan.   Telephone follow up appointment with pharmacy team member scheduled for: 3 months  Junita Push. Shainna Faux PharmD, BCPS Clinical Pharmacist [phone]  DASH Eating Plan DASH stands for "Dietary Approaches to Stop Hypertension." The DASH eating plan is a healthy eating plan that has been shown to reduce high blood pressure (hypertension). It may also reduce your risk for type 2 diabetes, heart disease, and stroke. The DASH eating plan may also help  with weight loss. What are tips for following this plan?  General guidelines  Avoid eating more than 2,300 mg (milligrams) of salt (sodium) a day. If you have hypertension, you may need to reduce your sodium intake to 1,500 mg a day.  Limit alcohol intake to no more than 1 drink a day for nonpregnant women and 2 drinks a day for men. One drink equals 12 oz of beer, 5 oz of wine, or 1 oz of hard liquor.  Work with your health care provider to maintain a healthy body weight or to lose weight. Ask what an ideal weight is for you.  Get at least 30 minutes of exercise that causes your heart to beat faster (aerobic exercise) most days of the week. Activities may include walking, swimming, or biking.  Work with your health care provider or diet and nutrition specialist (dietitian) to adjust your eating plan to your individual calorie needs. Reading food labels   Check food labels for the amount of sodium per serving. Choose foods with less than 5 percent of the Daily Value of sodium. Generally, foods with less than 300 mg of sodium per serving fit into this eating plan.  To find whole grains, look for the word "whole" as the first word in the ingredient list. Shopping  Buy products labeled as "low-sodium" or "no salt added."  Buy fresh foods. Avoid canned foods and premade or frozen meals. Cooking  Avoid adding salt when cooking. Use salt-free seasonings or herbs instead of table salt or sea salt. Check with your health care provider or pharmacist before using salt substitutes.  Do not fry foods. Cook foods using healthy methods such as baking, boiling, grilling, and broiling instead.  Cook with heart-healthy oils, such as olive, canola, soybean, or sunflower oil. Meal planning  Eat a balanced diet that includes: ? 5 or more servings of fruits and vegetables each day. At each meal, try to fill half of your plate with fruits and vegetables. ? Up to 6-8 servings of whole grains each  day. ? Less than 6 oz of lean meat, poultry, or fish each day. A 3-oz serving of meat is about the same size as a deck of cards. One egg equals 1 oz. ? 2 servings of low-fat dairy each day. ? A serving of nuts, seeds, or beans 5 times each week. ? Heart-healthy fats. Healthy fats called Omega-3 fatty acids are found in foods such as flaxseeds and coldwater fish, like sardines, salmon, and mackerel.  Limit how much you eat of the following: ? Canned or prepackaged foods. ? Food that is high in trans fat, such as fried foods. ? Food that is high in saturated fat, such as fatty meat. ? Sweets, desserts, sugary drinks, and other foods with added sugar. ? Full-fat dairy products.  Do not salt foods before eating.  Try to eat at least 2 vegetarian meals each week.  Eat more home-cooked food and less restaurant, buffet, and fast food.  When eating at a restaurant, ask that your food be prepared with less salt or no salt, if possible. What foods are recommended? The items listed may  not be a complete list. Talk with your dietitian about what dietary choices are best for you. Grains Whole-grain or whole-wheat bread. Whole-grain or whole-wheat pasta. Brown rice. [place]. Bulgur. Whole-grain and low-sodium cereals. Pita bread. Low-fat, low-sodium crackers. Whole-wheat flour tortillas. Vegetables Fresh or frozen vegetables (raw, steamed, roasted, or grilled). Low-sodium or reduced-sodium tomato and vegetable juice. Low-sodium or reduced-sodium tomato sauce and tomato paste. Low-sodium or reduced-sodium canned vegetables. Fruits All fresh, dried, or frozen fruit. Canned fruit in natural juice (without added sugar). Meat and other protein foods Skinless chicken or [place]. Ground chicken or [place]. Pork with fat trimmed off. Fish and seafood. Egg whites. Dried beans, peas, or lentils. Unsalted nuts, nut butters, and seeds. Unsalted canned beans. Lean cuts of beef with fat trimmed off.  Low-sodium, lean deli meat. Dairy Low-fat (1%) or fat-free (skim) milk. Fat-free, low-fat, or reduced-fat cheeses. Nonfat, low-sodium ricotta or cottage cheese. Low-fat or nonfat yogurt. Low-fat, low-sodium cheese. Fats and oils Soft margarine without trans fats. Vegetable oil. Low-fat, reduced-fat, or light mayonnaise and salad dressings (reduced-sodium). Canola, safflower, olive, soybean, and sunflower oils. Avocado. Seasoning and other foods Herbs. Spices. Seasoning mixes without salt. Unsalted popcorn and pretzels. Fat-free sweets. What foods are not recommended? The items listed may not be a complete list. Talk with your dietitian about what dietary choices are best for you. Grains Baked goods made with fat, such as croissants, muffins, or some breads. Dry pasta or rice meal packs. Vegetables Creamed or fried vegetables. Vegetables in a cheese sauce. Regular canned vegetables (not low-sodium or reduced-sodium). Regular canned tomato sauce and paste (not low-sodium or reduced-sodium). Regular tomato and vegetable juice (not low-sodium or reduced-sodium). Angie Fava. Olives. Fruits Canned fruit in a light or heavy syrup. Fried fruit. Fruit in cream or butter sauce. Meat and other protein foods Fatty cuts of meat. Ribs. Fried meat. Berniece Salines. Sausage. Bologna and other processed lunch meats. Salami. Fatback. Hotdogs. Bratwurst. Salted nuts and seeds. Canned beans with added salt. Canned or smoked fish. Whole eggs or egg yolks. Chicken or [place] with skin. Dairy Whole or 2% milk, cream, and half-and-half. Whole or full-fat cream cheese. Whole-fat or sweetened yogurt. Full-fat cheese. Nondairy creamers. Whipped toppings. Processed cheese and cheese spreads. Fats and oils Butter. Stick margarine. Lard. Shortening. Ghee. Bacon fat. Tropical oils, such as coconut, palm kernel, or palm oil. Seasoning and other foods Salted popcorn and pretzels. Onion salt, garlic salt, seasoned salt, table salt, and sea  salt. Worcestershire sauce. Tartar sauce. Barbecue sauce. Teriyaki sauce. Soy sauce, including reduced-sodium. Steak sauce. Canned and packaged gravies. Fish sauce. Oyster sauce. Cocktail sauce. Horseradish that you find on the shelf. Ketchup. Mustard. Meat flavorings and tenderizers. Bouillon cubes. Hot sauce and Tabasco sauce. Premade or packaged marinades. Premade or packaged taco seasonings. Relishes. Regular salad dressings. Where to find more information:  National Heart, Lung, and [place]: https://wilson-eaton.com/  American Heart Association: www.heart.org Summary  The DASH eating plan is a healthy eating plan that has been shown to reduce high blood pressure (hypertension). It may also reduce your risk for type 2 diabetes, heart disease, and stroke.  With the DASH eating plan, you should limit salt (sodium) intake to 2,300 mg a day. If you have hypertension, you may need to reduce your sodium intake to 1,500 mg a day.  When on the DASH eating plan, aim to eat more fresh fruits and vegetables, whole grains, lean proteins, low-fat dairy, and heart-healthy fats.  Work with your health care provider or diet and  nutrition specialist (dietitian) to adjust your eating plan to your individual calorie needs. This information is not intended to replace advice given to you by your health care provider. Make sure you discuss any questions you have with your health care provider. Document Revised: 01/26/2017 Document Reviewed: 02/07/2016 Elsevier Patient Education  2020 [employer].

## 2020-01-14 NOTE — Chronic Care Management (AMB) (Signed)
Chronic Care Management   Follow Up Note   01/14/2020 Name: Matthew [name] MRN: [id number] DOB: December 16, 1945  Referred by: Matthew Maple, MD Reason for referral : Chronic Care Management (follow-up)   COBURN [name] is a 74 y.o. year old male who is a primary care patient of Crissman, Jeannette How, MD. The CCM team was consulted for assistance with chronic disease management and care coordination needs.    Review of patient status, including review of consultants reports, relevant laboratory and other test results, and collaboration with appropriate care team members and the patient's provider was performed as part of comprehensive patient evaluation and provision of chronic care management services.    SDOH (Social Determinants of Health) assessments performed: Yes See Care Plan activities for detailed interventions related to SDOH)  SDOH Interventions     Most Recent Value  SDOH Interventions  Financial Strain Interventions Other (Comment)  [will initiate patient assistance income decreased since wife retired]       Outpatient Encounter Medications as of 01/14/2020  Medication Sig  . atorvastatin (LIPITOR) 20 MG tablet Take 1 tablet (20 mg total) by mouth daily.  . benazepril (LOTENSIN) 40 MG tablet Take 1 tablet (40 mg total) by mouth daily.  . insulin detemir (LEVEMIR) 100 UNIT/ML FlexPen Inject 15-20 Units into the skin at bedtime.  . Insulin Pen Needle 31G X 8 MM MISC Use one needle to administer insulin into skin nightly.  . metFORMIN (GLUCOPHAGE) 1000 MG tablet Take 1 tablet (1,000 mg total) by mouth 2 (two) times daily with a meal.  . Multiple Vitamins-Minerals (CENTRUM SILVER 50+MEN) TABS Take by mouth.  . Semaglutide,0.25 or 0.5MG/DOS, (OZEMPIC, 0.25 OR 0.5 MG/DOSE,) 2 MG/1.5ML SOPN Inject 0.375 mLs (0.5 mg total) into the skin once a week.  . ASPIRIN EC PO Take 81 mg by mouth daily.  (Patient not taking: Reported on 01/14/2020)  . hydrochlorothiazide (MICROZIDE) 12.5 MG  capsule Take 1 capsule (12.5 mg total) by mouth daily. (Patient not taking: Reported on 10/15/2019)  . metFORMIN (GLUCOPHAGE) 500 MG tablet Take 2 tablets (1,000 mg total) by mouth 2 (two) times daily with a meal. (Patient not taking: Reported on 01/14/2020)   No facility-administered encounter medications on file as of 01/14/2020.     Objective:   Goals Addressed            This Visit's Progress   . Pharmacy Care Plan       CARE PLAN ENTRY (see longitudinal plan of care for additional care plan information)  Current Barriers:  . Chronic Disease Management support, education, and care coordination needs related to Hypertension, Hyperlipidemia, Diabetes, and BPH   Hypertension BP Readings from Last 3 Encounters:  09/19/19 126/69  06/16/19 132/87  06/16/19 132/87   . Pharmacist Clinical Goal(s): o Over the next 90 days, patient will work with PharmD and providers to maintain BP goal <130/80 . Current regimen:  o Benazepril 40 mg qd . Interventions: o Provided diet and exercise counseling. o Reviewed fill history in computer o Encouraged patient to check BP at home to ensure adequate control . Patient self care activities - Over the next 90 days, patient will: o Check BP weekly, document, and provide at future appointments o Ensure daily salt intake < 2300 mg/day  Hyperlipidemia Lab Results  Component Value Date/Time   LDLCALC 73 09/19/2019 09:16 AM   . Pharmacist Clinical Goal(s): o Over the next 90 days, patient will work with PharmD and providers to maintain LDL  goal < 100 . Current regimen:  o Atorvastatin 20 mg qd . Interventions: o Provided diet and exercise counseling. . Patient self care activities - Over the next 90 days, patient will: o Continue focus on DASH diet and incorporate structured exercise program with a goal of 30 min/day 5 days/week  Diabetes Lab Results  Component Value Date/Time   HGBA1C 7.5 (H) 09/19/2019 09:16 AM   HGBA1C 7.3 (H)  06/16/2019 09:22 AM   HGBA1C >14.0 (H) 03/19/2019 04:11 PM   HGBA1C 6.8 08/08/2017 10:25 AM   . Pharmacist Clinical Goal(s): o Over the next 90 days, patient will work with PharmD and providers to achieve A1c goal <7% . Current regimen:  . Ozempic 0.53m weekly . Metformin 10066mbid . Levemir 20 units daily . Interventions: o Provided diet and exercise counseling. o Reviewed goal glucose readings for an A1c of <7%, we want to see fasting sugars <130 and 2 hour after meal sugars <180.  o Reviewed patient assistance eligibility for Ozempic. Patient states income within limits now that his wife has retired. Will assist in applying . Patient self care activities - Over the next 90 days, patient will: o Check blood sugar 2-3 times weekly document, and provide at future appointments o Contact provider with any episodes of hypoglycemia o Provide required portion of PAP documentation.  Medication management . Pharmacist Clinical Goal(s): o Over the next 90 days, patient will work with PharmD and providers to achieve optimal medication adherence . [facility] Interventions o Comprehensive medication review performed. o Continue current medication management strategy . Patient self care activities - Over the next 90 days, patient will: o Focus on medication adherence by fill dates o Take medications as prescribed o Report any questions or concerns to PharmD and/or provider(s)  Initial goal documentation        Diabetes   A1c goal <7%  Recent Relevant Labs: Lab Results  Component Value Date/Time   HGBA1C 7.5 (H) 09/19/2019 09:16 AM   HGBA1C 7.3 (H) 06/16/2019 09:22 AM   HGBA1C >14.0 (H) 03/19/2019 04:11 PM   HGBA1C 6.8 08/08/2017 10:25 AM   MICROALBUR 10 03/19/2019 04:11 PM   MICROALBUR 30 (H) 12/30/2014 08:48 AM    Last diabetic Eye exam: No results found for: HMDIABEYEEXA  Last diabetic Foot exam: No results found for: HMDIABFOOTEX   Checking BG:  Never   Patient has failed these meds in past: NA Patient is currently query  controlled on the following medications: . Ozempic 0.80m59meekly . Metformin 1000m69md . Levemir 20 units daily  We discussed: Patient states he doesn't check his BG because he "hasn't done anything different".  Reviewed signs and symptoms of hypoglycemia- patient denies any episodes. He denies any missed doses.Counseled on the importance of SMBG in diabetes care. He states he will try to check 1-2 times weekly and document. Reviewed goal glucose readings for an A1c of <7%, we want to see fasting sugars <130 and 2 hour after meal sugars <180.  [place] and split their time between both homes. He endorses staying very busy with volunteer and church activities. Verified last fill of Ozempic 12/08/19 and he has 2 refills remaining per [facility] [place] states his wife retired in February and their income no longer exceeds the limits for Ozempic PAP. He will stop by the office to sign forms and drop off proof of income tomorrow or Friday.  Plan  Continue current medications  Hypertension   BP goal is:  <130/80  Office blood pressures are  BP Readings from Last 3 Encounters:  09/19/19 126/69  06/16/19 132/87  06/16/19 132/87   BMP Latest Ref Rng & Units 09/19/2019 03/24/2019 03/19/2019  Glucose 65 - 99 mg/dL 198(H) 192(H) 287(H)  BUN 8 - 27 mg/dL '25 24 20  ' Creatinine 0.76 - 1.27 mg/dL 1.50(H) 1.36(H) 1.57(H)  BUN/Creat Ratio 10 - '24 17 18 13  ' Sodium 134 - 144 mmol/L 136 135 131(L)  Potassium 3.5 - 5.2 mmol/L 5.2 4.2 4.3  Chloride 96 - 106 mmol/L 102 99 93(L)  CO2 20 - 29 mmol/L '22 23 25  ' Calcium 8.6 - 10.2 mg/dL 9.5 9.4 9.7   Patient checks BP at home Not Checking   Patient has failed these meds in the past: NA Patient is currently query controlled on the following medications:  . Benazepril 40 mg qd  We discussed Patient states his BP has always been  "good". He does not recall every being prescribed HCTZ and is not taking. He knows he is "taking something for my blood pressure".  Counseled on the renal protective effects ACE-I in diabetes. Reviewed labs from last office visit with upward trend of A1C and renal dysfunction.  Plan  Continue current medications and control with diet and exercise.    Hyperlipidemia   LDL goal < 100  Last lipids Lab Results  Component Value Date   CHOL 133 09/19/2019   HDL 44 09/19/2019   LDLCALC 73 09/19/2019   TRIG 84 09/19/2019   CHOLHDL 3.1 05/23/2016   Hepatic Function Latest Ref Rng & Units 09/19/2019 03/19/2019 05/23/2016  Total Protein 6.0 - 8.5 g/dL 7.0 6.2 6.8  Albumin 3.7 - 4.7 g/dL 4.6 4.3 4.3  AST 0 - 40 IU/L '20 16 25  ' ALT 0 - 44 IU/L 23 23 42  Alk Phosphatase 48 - 121 IU/L 75 84 58  Total Bilirubin 0.0 - 1.2 mg/dL <0.2 0.4 0.4     The 10-year ASCVD risk score Mikey Bussing DC Jr., et al., 2013) is: 35.9%   Values used to calculate the score:     Age: 12 years     Sex: Male     Is Non-Hispanic African American: No     Diabetic: Yes     Tobacco smoker: No     Systolic Blood Pressure: 615 mmHg     Is BP treated: Yes     HDL Cholesterol: 44 mg/dL     Total Cholesterol: 133 mg/dL   Patient has failed these meds in past: NA Patient is currently controlled on the following medications:  . Atorvastatin 20 mg qd  We discussed:  diet and exercise extensively  Plan  Continue current medications     Plan:   Telephone follow up appointment with care management team member scheduled for: 8 -12 weeks.   Junita Push. Kenton Kingfisher PharmD, [facility] [phone]

## 2020-01-16 ENCOUNTER — Telehealth: Payer: Medicare HMO

## 2020-01-16 ENCOUNTER — Telehealth: Payer: Self-pay | Admitting: General Practice

## 2020-01-16 NOTE — Telephone Encounter (Signed)
  Chronic Care Management   Outreach Note  01/16/2020 Name: Matthew [name] MRN: [id number] DOB: [birth year]  Referred by: Guadalupe Maple, MD Reason for referral : Chronic Care Management (RNCM Follow up call for Chronic Disease Management and Care Coordination Needs)   An unsuccessful telephone outreach was attempted today. The patient was referred to the case management team for assistance with care management and care coordination. The patients wife states that the patient is not at home right now.   Follow Up Plan: The care management team will reach out to the patient again over the next 30 to 60 days.   Noreene Larsson RN, MSN, [facility] Mobile: [phone]

## 2020-02-05 NOTE — Telephone Encounter (Signed)
Pt has been r/s  

## 2020-02-24 ENCOUNTER — Telehealth: Payer: Self-pay | Admitting: Pharmacist

## 2020-02-24 NOTE — Progress Notes (Addendum)
Chronic Care Management Pharmacy Assistant   Name: Matthew [name]  MRN: [id number] DOB: 04/21/1945  Reason for Encounter: Disease State Review Diabetes   Patient Questions:  1.  Have you seen any other providers since your last visit? No  2.  Any changes in your medicines or health? No    PCP : Steele Sizer, MD  Allergies:  No Known Allergies  Medications: Outpatient Encounter Medications as of 02/24/2020  Medication Sig   ASPIRIN EC PO Take 81 mg by mouth daily.  (Patient not taking: Reported on 01/14/2020)   atorvastatin (LIPITOR) 20 MG tablet Take 1 tablet (20 mg total) by mouth daily.   benazepril (LOTENSIN) 40 MG tablet Take 1 tablet (40 mg total) by mouth daily.   hydrochlorothiazide (MICROZIDE) 12.5 MG capsule Take 1 capsule (12.5 mg total) by mouth daily. (Patient not taking: Reported on 10/15/2019)   insulin detemir (LEVEMIR) 100 UNIT/ML FlexPen Inject 15-20 Units into the skin at bedtime.   Insulin Pen Needle 31G X 8 MM MISC Use one needle to administer insulin into skin nightly.   metFORMIN (GLUCOPHAGE) 1000 MG tablet Take 1 tablet (1,000 mg total) by mouth 2 (two) times daily with a meal.   metFORMIN (GLUCOPHAGE) 500 MG tablet Take 2 tablets (1,000 mg total) by mouth 2 (two) times daily with a meal. (Patient not taking: Reported on 01/14/2020)   Multiple Vitamins-Minerals (CENTRUM SILVER 50+MEN) TABS Take by mouth.   Semaglutide,0.25 or 0.5MG /DOS, (OZEMPIC, 0.25 OR 0.5 MG/DOSE,) 2 MG/1.5ML SOPN Inject 0.375 mLs (0.5 mg total) into the skin once a week.   No facility-administered encounter medications on file as of 02/24/2020.    Current Diagnosis: Patient Active Problem List   Diagnosis Date Noted   BPH (benign prostatic hyperplasia) 12/30/2014   Type 2 diabetes mellitus with hyperglycemia (HCC)    Hyperlipidemia associated with type 2 diabetes mellitus (HCC)    Hypertension associated with diabetes (HCC)     Recent Relevant Labs: Lab Results  Component  Value Date/Time   HGBA1C 7.5 (H) 09/19/2019 09:16 AM   HGBA1C 7.3 (H) 06/16/2019 09:22 AM   HGBA1C >14.0 (H) 03/19/2019 04:11 PM   HGBA1C 6.8 08/08/2017 10:25 AM   MICROALBUR 10 03/19/2019 04:11 PM   MICROALBUR 30 (H) 12/30/2014 08:48 AM    Kidney Function Lab Results  Component Value Date/Time   CREATININE 1.50 (H) 09/19/2019 09:16 AM   CREATININE 1.36 (H) 03/24/2019 03:14 PM   GFRNONAA 46 (L) 09/19/2019 09:16 AM   GFRAA 53 (L) 09/19/2019 09:16 AM    Current antihyperglycemic regimen:  Levemir: 15-20 units daily at bedtime Metformin 1000mg : One tablet twice daily.  Ozempic 0.5mg : Inject 0.5 mg into the skin once weekly.   What recent interventions/DTPs have been made to improve glycemic control:  The patient explained he continues to limit his portion sizes and being more mindful of what he is eating.  Have there been any recent hospitalizations or ED visits since last visit with CPP? No  Patient denies hypoglycemic symptoms, including Pale, Sweaty, Shaky, Hungry, Nervous/irritable and Vision changes   Patient denies hyperglycemic symptoms, including blurry vision, excessive thirst, fatigue, polyuria and weakness  How often are you checking your blood sugar? Patient expressed " I do not remember the last time I checked my blood sugars."   Are you checking your feet daily/regularly? Patient responded with a simple "no".   Adherence Review: Is the patient currently on a STATIN medication? Yes/ Atorvastatin 20 mg.  Is  the patient currently on ACE/ARB medication? Yes/ Benazepril 40 mg  Does the patient have >5 day gap between last estimated fill dates? Yes  02-24-2020: Called the patient to discuss and review the disease state management of his diabetes. The patient explained he does not check his blood sugars and stated " I do not remember the last time I took my blood sugar." He reported no symptoms of hyperglycemia or hypoglycemia. He also reported that he is continuing to  watch his diet and has cut down on portion sizes. Reported no acute needs related to medications. Confirmed his current medication regimen for diabetes. Reminded the patient about his upcoming appointment with Aura Dials, NP on 03-19-2020 at 9:00 am. The patient confirmed the appointment date and time.   Burnard Leigh, LPN Clinical Pharmacist Assistant  [phone]   Follow-Up:  Pharmacist Review  I have reviewed the care management and care coordination activities outlined in this encounter and I am certifying that I agree with the content of this note.  Mercer Pod. Tiburcio Pea PharmD, BCPS Clinical Pharmacist [facility] [phone]

## 2020-03-13 ENCOUNTER — Other Ambulatory Visit: Payer: Self-pay | Admitting: Nurse Practitioner

## 2020-03-13 ENCOUNTER — Encounter: Payer: Self-pay | Admitting: Nurse Practitioner

## 2020-03-13 DIAGNOSIS — E1122 Type 2 diabetes mellitus with diabetic chronic kidney disease: Secondary | ICD-10-CM | POA: Insufficient documentation

## 2020-03-13 DIAGNOSIS — N183 Chronic kidney disease, stage 3 unspecified: Secondary | ICD-10-CM | POA: Insufficient documentation

## 2020-03-19 ENCOUNTER — Ambulatory Visit: Payer: Self-pay | Admitting: Nurse Practitioner

## 2020-03-26 ENCOUNTER — Telehealth: Payer: Medicare HMO

## 2020-03-26 ENCOUNTER — Telehealth: Payer: Self-pay | Admitting: General Practice

## 2020-03-26 NOTE — Telephone Encounter (Signed)
  Chronic Care Management   Outreach Note  03/26/2020 Name: Matthew [name] MRN: [id number] DOB: 02-Dec-1945  Referred by: Guadalupe Maple, MD Reason for referral : Appointment (RNCM: Follow up for Chronic Disease Management and Care Coordination Needs )   An unsuccessful telephone outreach was attempted today. The patient was referred to the case management team for assistance with care management and care coordination.   Follow Up Plan: A HIPAA compliant phone message was left for the patient providing contact information and requesting a return call.    Noreene Larsson RN, MSN, [facility] Mobile: [phone]

## 2020-04-07 ENCOUNTER — Telehealth: Payer: Self-pay

## 2020-04-07 NOTE — Chronic Care Management (AMB) (Signed)
  Care Management   Note  04/07/2020 Name: Matthew [name] MRN: [id number] DOB: 08-20-1945  Matthew [name] is a 75 y.o. year old male who is a primary care patient of Crissman, Jeannette How, MD and is actively engaged with the care management team. I reached out to Matthew [name] by phone today to assist with re-scheduling a follow up visit with the RN Case Manager  Follow up plan: Unsuccessful telephone outreach attempt made. A HIPAA compliant phone message was left for the patient providing contact information and requesting a return call.  The care management team will reach out to the patient again over the next 7 days.  If patient returns call to provider office, please advise to call [place]  at [place], [place], [place], [place] [address] Direct Dial: [phone] [email or web address] Website: [email or web address]

## 2020-04-21 NOTE — Chronic Care Management (AMB) (Signed)
  Care Management   Note  04/21/2020 Name: Matthew [name] MRN: [id number] DOB: 1945-07-04  Matthew [name] is a 75 y.o. year old male who is a primary care patient of [name], Avanti, MD and is actively engaged with the care management team. I reached out to Matthew [name] by phone today to assist with re-scheduling a follow up visit with the RN Case Manager  Follow up plan: Unsuccessful telephone outreach attempt made. A HIPAA compliant phone message was left for the patient providing contact information and requesting a return call.  The care management team will reach out to the patient again over the next 7 days.  If patient returns call to provider office, please advise to call [place]  at [place], [place], [place], [place] [address] Direct Dial: [phone] [email or web address] Website: [email or web address]

## 2020-05-18 NOTE — Chronic Care Management (AMB) (Signed)
  Care Management   Note  05/18/2020 Name: Matthew [name] MRN: [id number] DOB: 04-17-1945  Matthew [name] is a 75 y.o. year old male who is a primary care patient of [name], Avanti, MD and is actively engaged with the care management team. I reached out to Matthew [name] by phone today to assist with re-scheduling a follow up visit with the RN Case Manager  Follow up plan: Telephone appointment with care management team member scheduled for:06/30/2020  Noreene Larsson, [place], [place], [place] [address] Direct Dial: [phone] [email or web address]

## 2020-05-21 ENCOUNTER — Other Ambulatory Visit: Payer: Self-pay | Admitting: Family Medicine

## 2020-05-21 ENCOUNTER — Other Ambulatory Visit: Payer: Self-pay | Admitting: Nurse Practitioner

## 2020-05-21 ENCOUNTER — Ambulatory Visit: Payer: Self-pay | Admitting: Nurse Practitioner

## 2020-05-21 DIAGNOSIS — E7800 Pure hypercholesterolemia, unspecified: Secondary | ICD-10-CM

## 2020-05-21 DIAGNOSIS — I10 Essential (primary) hypertension: Secondary | ICD-10-CM

## 2020-05-21 DIAGNOSIS — E119 Type 2 diabetes mellitus without complications: Secondary | ICD-10-CM

## 2020-05-21 NOTE — Telephone Encounter (Signed)
Requested Prescriptions  Pending Prescriptions Disp Refills  . benazepril (LOTENSIN) 40 MG tablet [Pharmacy Med Name: BENAZEPRIL HCL 40 MG TABLET] 90 tablet 0    Sig: Take 1 tablet (40 mg total) by mouth daily.     Cardiovascular:  ACE Inhibitors Failed - 05/21/2020  1:50 PM      Failed - Cr in normal range and within 180 days    Creatinine, Ser  Date Value Ref Range Status  09/19/2019 1.50 (H) 0.76 - 1.27 mg/dL Final         Failed - K in normal range and within 180 days    Potassium  Date Value Ref Range Status  09/19/2019 5.2 3.5 - 5.2 mmol/L Final         Failed - Valid encounter within last 6 months    Recent Outpatient Visits          8 months ago Hypertension associated with diabetes [facility])   [facility] [place], [place]   11 months ago Hypertension associated with diabetes [facility])   [place], [place], [place]   1 year ago Type 2 diabetes mellitus with hyperglycemia, with long-term current use of insulin ([place])   [place], [place] T, NP   1 year ago Type 2 diabetes mellitus with hyperglycemia, with long-term current use of insulin ([place])   [place], [place] T, NP   1 year ago Type 2 diabetes mellitus with hyperglycemia, without long-term current use of insulin ([place])   [place], [place] T, NP      Future Appointments            In 3 weeks  [place], [facility]   In 2 months [place], [place] T, NP [employer], [place] - Patient is not pregnant      Passed - Last BP in normal range    BP Readings from Last 1 Encounters:  09/19/19 126/69         . atorvastatin (LIPITOR) 20 MG tablet [Pharmacy Med Name: ATORVASTATIN 20 MG TABLET] 90 tablet 0    Sig: Take 1 tablet (20 mg total) by mouth daily.     Cardiovascular:  Antilipid - Statins Failed - 05/21/2020  1:50 PM      Failed - LDL in normal range and within 360 days     LDL Chol Calc (NIH)  Date Value Ref Range Status  09/19/2019 73 0 - 99 mg/dL Final         Passed - Total Cholesterol in normal range and within 360 days    Cholesterol, Total  Date Value Ref Range Status  09/19/2019 133 100 - 199 mg/dL Final   Cholesterol Piccolo, Waived  Date Value Ref Range Status  03/19/2019 150 <200 mg/dL Final    Comment:                            Desirable                <200                         Borderline High      200- 239                         High                     >  239          Passed - HDL in normal range and within 360 days    HDL  Date Value Ref Range Status  09/19/2019 44 >39 mg/dL Final         Passed - Triglycerides in normal range and within 360 days    Triglycerides  Date Value Ref Range Status  09/19/2019 84 0 - 149 mg/dL Final   Triglycerides Piccolo,Waived  Date Value Ref Range Status  03/19/2019 224 (H) <150 mg/dL Final    Comment:                            Normal                   <150                         Borderline High     150 - 199                         High                200 - 499                         Very High                >499          Passed - Patient is not pregnant      Passed - Valid encounter within last 12 months    Recent Outpatient Visits          8 months ago Hypertension associated with diabetes [facility])   [place], [place], [place]   11 months ago Hypertension associated with diabetes [facility])   [place], [place], [place]   1 year ago Type 2 diabetes mellitus with hyperglycemia, with long-term current use of insulin ([place])   [place], [place] T, NP   1 year ago Type 2 diabetes mellitus with hyperglycemia, with long-term current use of insulin ([place])   [place], [place] T, NP   1 year ago Type 2 diabetes mellitus with hyperglycemia, without long-term current use of insulin ([place])    [place], Barbaraann Faster, NP      Future Appointments            In 3 weeks  [place], [facility]   In 2 months [place], Barbaraann Faster, NP [employer], [facility]

## 2020-05-21 NOTE — Telephone Encounter (Signed)
Requested Prescriptions  Pending Prescriptions Disp Refills  . metFORMIN (GLUCOPHAGE) 1000 MG tablet [Pharmacy Med Name: METFORMIN HCL 1,000 MG TABLET] 180 tablet 0    Sig: Take 1 tablet (1,000 mg total) by mouth 2 (two) times daily with a meal.     Endocrinology:  Diabetes - Biguanides Failed - 05/21/2020  1:51 PM      Failed - Cr in normal range and within 360 days    Creatinine, Ser  Date Value Ref Range Status  09/19/2019 1.50 (H) 0.76 - 1.27 mg/dL Final         Failed - HBA1C is between 0 and 7.9 and within 180 days    HB A1C (BAYER DCA - WAIVED)  Date Value Ref Range Status  03/19/2019 >14.0 (H) <7.0 % Final    Comment:                                          Diabetic Adult            <7.0                                       Healthy Adult        4.3 - 5.7                                                           (DCCT/NGSP) American Diabetes Association's Summary of Glycemic Recommendations for Adults with Diabetes: Hemoglobin A1c <7.0%. More stringent glycemic goals (A1c <6.0%) may further reduce complications at the cost of increased risk of hypoglycemia.    Hgb A1c MFr Bld  Date Value Ref Range Status  09/19/2019 7.5 (H) 4.8 - 5.6 % Final    Comment:             Prediabetes: 5.7 - 6.4          Diabetes: >6.4          Glycemic control for adults with diabetes: <7.0          Failed - eGFR in normal range and within 360 days    GFR calc Af Amer  Date Value Ref Range Status  09/19/2019 53 (L) >59 mL/min/1.73 Final    Comment:    **[facility] currently reports eGFR in compliance with the current**   recommendations of the [employer]. [facility] will   update reporting as new guidelines are published from the NKF-ASN   Task force.    GFR calc non Af Amer  Date Value Ref Range Status  09/19/2019 46 (L) >59 mL/min/1.73 Final         Failed - Valid encounter within last 6 months    Recent Outpatient Visits          8 months ago Hypertension  associated with diabetes [facility])   [facility] [place], [place]   11 months ago Hypertension associated with diabetes [facility])   [place], [place], [place]   1 year ago Type 2 diabetes mellitus with hyperglycemia, with long-term current use of insulin ([place])   [facility] [place], [place]  T, NP   1 year ago Type 2 diabetes mellitus with hyperglycemia, with long-term current use of insulin ([place])   [place], [place] T, NP   1 year ago Type 2 diabetes mellitus with hyperglycemia, without long-term current use of insulin ([place])   [place], Barbaraann Faster, NP      Future Appointments            In 3 weeks  [place], [place]   In 2 months [place], Barbaraann Faster, NP [employer], [facility]

## 2020-06-17 ENCOUNTER — Ambulatory Visit: Payer: Medicare HMO

## 2020-06-25 ENCOUNTER — Ambulatory Visit: Payer: Medicare HMO

## 2020-06-25 ENCOUNTER — Telehealth: Payer: Self-pay

## 2020-06-25 NOTE — Telephone Encounter (Signed)
This nurse attempted to call patient three times in order to perform scheduled telephonic AWV. Called at [place], [phone], [phone]. Message left to call and reschedule for another time.

## 2020-06-30 ENCOUNTER — Telehealth: Payer: Medicare HMO

## 2020-06-30 ENCOUNTER — Telehealth: Payer: Self-pay | Admitting: General Practice

## 2020-06-30 NOTE — Telephone Encounter (Signed)
  Chronic Care Management   Outreach Note  06/30/2020 Name: Matthew [name] MRN: [id number] DOB: [birth year]  Referred by: Charlynne Cousins, MD Reason for referral : Appointment (RNCM: 3rd attempt for outreach follow up for Chronic Disease Management and Care Coordination Needs)   Third unsuccessful telephone outreach was attempted today. The patient was referred to the case management team for assistance with care management and care coordination. The patient's primary care provider has been notified of our unsuccessful attempts to make or maintain contact with the patient. The care management team is pleased to engage with this patient at any time in the future should he/she be interested in assistance from the care management team.   Follow Up Plan: We have been unable to make contact with the patient for follow up. The care management team is available to follow up with the patient after provider conversation with the patient regarding recommendation for care management engagement and subsequent re-referral to the care management team.   Noreene Larsson RN, MSN, [facility] Mobile: [phone]

## 2020-07-13 ENCOUNTER — Other Ambulatory Visit: Payer: Self-pay | Admitting: Family Medicine

## 2020-07-17 ENCOUNTER — Encounter: Payer: Self-pay | Admitting: Nurse Practitioner

## 2020-07-17 DIAGNOSIS — E669 Obesity, unspecified: Secondary | ICD-10-CM | POA: Insufficient documentation

## 2020-07-21 ENCOUNTER — Encounter: Payer: Self-pay | Admitting: Nurse Practitioner

## 2020-07-21 ENCOUNTER — Ambulatory Visit (INDEPENDENT_AMBULATORY_CARE_PROVIDER_SITE_OTHER): Payer: Medicare HMO | Admitting: Nurse Practitioner

## 2020-07-21 ENCOUNTER — Other Ambulatory Visit: Payer: Self-pay

## 2020-07-21 VITALS — BP 123/66 | HR 80 | Temp 98.5°F | Wt 246.2 lb

## 2020-07-21 DIAGNOSIS — E1169 Type 2 diabetes mellitus with other specified complication: Secondary | ICD-10-CM

## 2020-07-21 DIAGNOSIS — E1165 Type 2 diabetes mellitus with hyperglycemia: Secondary | ICD-10-CM | POA: Diagnosis not present

## 2020-07-21 DIAGNOSIS — E1159 Type 2 diabetes mellitus with other circulatory complications: Secondary | ICD-10-CM

## 2020-07-21 DIAGNOSIS — E538 Deficiency of other specified B group vitamins: Secondary | ICD-10-CM

## 2020-07-21 DIAGNOSIS — E1122 Type 2 diabetes mellitus with diabetic chronic kidney disease: Secondary | ICD-10-CM | POA: Diagnosis not present

## 2020-07-21 DIAGNOSIS — E785 Hyperlipidemia, unspecified: Secondary | ICD-10-CM | POA: Diagnosis not present

## 2020-07-21 DIAGNOSIS — I152 Hypertension secondary to endocrine disorders: Secondary | ICD-10-CM | POA: Diagnosis not present

## 2020-07-21 DIAGNOSIS — N183 Chronic kidney disease, stage 3 unspecified: Secondary | ICD-10-CM

## 2020-07-21 DIAGNOSIS — Z794 Long term (current) use of insulin: Secondary | ICD-10-CM | POA: Diagnosis not present

## 2020-07-21 LAB — MICROALBUMIN, URINE WAIVED
Creatinine, Urine Waived: 200 mg/dL (ref 10–300)
Microalb, Ur Waived: 10 mg/L (ref 0–19)
Microalb/Creat Ratio: <30 mg/g (ref <30)

## 2020-07-21 LAB — HM DIABETES EYE EXAM

## 2020-07-21 LAB — BAYER DCA HB A1C WAIVED: HB A1C (BAYER DCA - WAIVED): 9.1 % — ABNORMAL HIGH (ref <7.0)

## 2020-07-21 MED ORDER — LEVEMIR FLEXTOUCH 100 UNIT/ML ~~LOC~~ SOPN: PEN_INJECTOR | SUBCUTANEOUS | 3 refills | Status: DC

## 2020-07-21 MED ORDER — DAPAGLIFLOZIN PROPANEDIOL 10 MG PO TABS: 10.0000 mg | ORAL_TABLET | Freq: Every day | ORAL | 3 refills | Status: DC

## 2020-07-21 NOTE — Assessment & Plan Note (Signed)
Chronic, stable with BP below goal.  Recommend he monitor BP at least a few mornings a week at home and document.  DASH diet at home.  Continue current medication regimen and adjust as needed, Benazepril offering kidney protection.  Labs today: CMP and TSH today.  Return in 3 months.

## 2020-07-21 NOTE — Progress Notes (Addendum)
BP 123/66   Pulse 80   Temp 98.5 F (36.9 C) (Oral)   Wt 246 lb 3.2 oz (111.7 kg)   SpO2 97%   BMI 35.33 kg/m    Subjective:    Patient ID: Matthew [name], male    DOB: 1945-07-16, 75 y.o.   MRN: [id number]  HPI: Matthew [name] is a 75 y.o. male  Chief Complaint  Patient presents with  . Diabetes  . Hyperlipidemia  . Hypertension  . Chronic Kidney Disease   DIABETES In July 2021 A1c was 7.5%, has missed some visits since this time.  Spends time at home in [place] a lot.  Not taking Ozempic -- his neck swelled up with this x 2 times.  His diet is not always adherent, eats what he wants.  Taking Metformin and Levemir. Hypoglycemic episodes:no Polydipsia/polyuria: yes Visual disturbance: no Chest pain: no Paresthesias: no Glucose Monitoring: yes  Accucheck frequency: rarely  Fasting glucose: has had it down to 113 to 200  Post prandial: 227 recently  Evening:  Before meals: Taking Insulin?: no  Long acting insulin: Levemir 20 units  Short acting insulin: Blood Pressure Monitoring: does not check Retinal Examination: Not Up To Date  Foot Exam: Up to Date Pneumovax: Up to Date Influenza: Up to Date Aspirin: yes   HYPERTENSION / HYPERLIPIDEMIA Continues on Benazepril, HCTZ, and Atorvastatin + ASA. Satisfied with current treatment? yes Duration of hypertension: chronic BP monitoring frequency: not checking BP range:  BP medication side effects: no Duration of hyperlipidemia: chronic Cholesterol medication side effects: no Cholesterol supplements: none Medication compliance: good compliance Aspirin: yes Recent stressors: no Recurrent headaches: no Visual changes: no Palpitations: no Dyspnea: no Chest pain: no Lower extremity edema: no Dizzy/lightheaded: no   CHRONIC KIDNEY DISEASE Noted on past CRT 1.50 and eGFR 46.   CKD status: stable Medications renally dose: yes Previous renal evaluation: no Pneumovax:  Up to Date Influenza Vaccine:  Up to  Date  Relevant past medical, surgical, family and social history reviewed and updated as indicated. Interim medical history since our last visit reviewed. Allergies and medications reviewed and updated.  Review of Systems  Constitutional: Negative for activity change, diaphoresis, fatigue and fever.  Respiratory: Negative for cough, chest tightness, shortness of breath and wheezing.   Cardiovascular: Negative for chest pain, palpitations and leg swelling.  Gastrointestinal: Negative.   Endocrine: Negative for cold intolerance, heat intolerance, polydipsia, polyphagia and polyuria.  Neurological: Negative.   Psychiatric/Behavioral: Negative.    Per HPI unless specifically indicated above    Objective:    BP 123/66   Pulse 80   Temp 98.5 F (36.9 C) (Oral)   Wt 246 lb 3.2 oz (111.7 kg)   SpO2 97%   BMI 35.33 kg/m   Wt Readings from Last 3 Encounters:  07/21/20 246 lb 3.2 oz (111.7 kg)  09/19/19 (!) 232 lb (105.2 kg)  06/16/19 236 lb (107 kg)    Physical Exam Vitals and nursing note reviewed.  Constitutional:      General: He is awake. He is not in acute distress.    Appearance: He is well-developed and overweight. He is not ill-appearing.  HENT:     Head: Normocephalic and atraumatic.     Right Ear: Hearing normal. No drainage.     Left Ear: Hearing normal. No drainage.  Eyes:     General: Lids are normal.        Right eye: No discharge.  Left eye: No discharge.     Conjunctiva/sclera: Conjunctivae normal.     Pupils: Pupils are equal, round, and reactive to light.  Neck:     Thyroid: No thyromegaly.     Vascular: No carotid bruit.  Cardiovascular:     Rate and Rhythm: Normal rate and regular rhythm.     Heart sounds: Normal heart sounds, S1 normal and S2 normal. No murmur heard. No gallop.   Pulmonary:     Effort: Pulmonary effort is normal. No accessory muscle usage or respiratory distress.     Breath sounds: Normal breath sounds.  Abdominal:      General: Bowel sounds are normal.     Palpations: Abdomen is soft.     Tenderness: There is no abdominal tenderness.  Musculoskeletal:        General: Normal range of motion.     Cervical back: Normal range of motion and neck supple.     Right lower leg: No edema.     Left lower leg: No edema.  Skin:    General: Skin is warm and dry.     Capillary Refill: Capillary refill takes less than 2 seconds.  Neurological:     Mental Status: He is alert and oriented to person, place, and time.     Deep Tendon Reflexes: Reflexes are normal and symmetric.  Psychiatric:        Attention and Perception: Attention normal.        Mood and Affect: Mood normal.        Speech: Speech normal.        Behavior: Behavior normal. Behavior is cooperative.        Thought Content: Thought content normal.        Judgment: Judgment normal.    Diabetic Foot Exam - Simple   Simple Foot Form Visual Inspection No deformities, no ulcerations, no other skin breakdown bilaterally: Yes Sensation Testing Intact to touch and monofilament testing bilaterally: Yes Pulse Check Posterior Tibialis and Dorsalis pulse intact bilaterally: Yes Comments    Results for orders placed or performed in visit on 09/19/19  Comprehensive metabolic panel  Result Value Ref Range   Glucose 198 (H) 65 - 99 mg/dL   BUN 25 8 - 27 mg/dL   Creatinine, Ser 1.50 (H) 0.76 - 1.27 mg/dL   GFR calc non Af Amer 46 (L) >59 mL/min/1.73   GFR calc Af Amer 53 (L) >59 mL/min/1.73   BUN/Creatinine Ratio 17 10 - 24   Sodium 136 134 - 144 mmol/L   Potassium 5.2 3.5 - 5.2 mmol/L   Chloride 102 96 - 106 mmol/L   CO2 22 20 - 29 mmol/L   Calcium 9.5 8.6 - 10.2 mg/dL   Total Protein 7.0 6.0 - 8.5 g/dL   Albumin 4.6 3.7 - 4.7 g/dL   Globulin, Total 2.4 1.5 - 4.5 g/dL   Albumin/Globulin Ratio 1.9 1.2 - 2.2   Bilirubin Total <0.2 0.0 - 1.2 mg/dL   Alkaline Phosphatase 75 48 - 121 IU/L   AST 20 0 - 40 IU/L   ALT 23 0 - 44 IU/L  Lipid Panel w/o  Chol/HDL Ratio  Result Value Ref Range   Cholesterol, Total 133 100 - 199 mg/dL   Triglycerides 84 0 - 149 mg/dL   HDL 44 >39 mg/dL   VLDL Cholesterol Cal 16 5 - 40 mg/dL   LDL Chol Calc (NIH) 73 0 - 99 mg/dL  PSA  Result Value Ref Range   Prostate  Specific Ag, Serum 1.0 0.0 - 4.0 ng/mL  HgB A1c  Result Value Ref Range   Hgb A1c MFr Bld 7.5 (H) 4.8 - 5.6 %   Est. average glucose Bld gHb Est-mCnc 169 mg/dL      Assessment & Plan:   Problem List Items Addressed This Visit      Cardiovascular and Mediastinum   Hypertension associated with diabetes ([place])    Chronic, stable with BP below goal.  Recommend he monitor BP at least a few mornings a week at home and document.  DASH diet at home.  Continue current medication regimen and adjust as needed, Benazepril offering kidney protection.  Labs today: CMP and TSH today.  Return in 3 months.       Relevant Medications   insulin detemir (LEVEMIR FLEXTOUCH) 100 UNIT/ML FlexPen   dapagliflozin propanediol (FARXIGA) 10 MG TABS tablet   Other Relevant Orders   Bayer DCA Hb A1c Waived   Microalbumin, Urine Waived   TSH   Comprehensive metabolic panel     Endocrine   Type 2 diabetes mellitus with hyperglycemia (HCC) - Primary    Chronic, ongoing with A1c upward trend to 9.1%.  Tolerating insulin well.  Discussed at length BS goals with him and highly recommended he check these 3 times a day and attend all appointments as scheduled.  Will get eye exam in office today.  Continue Metformin and Levemir increase to 30 units.   Allergic to Ozempic with swelling presenting, will avoid GLP 1 family due to this.  Start Farxiga 10 MG daily and educated him on this, recent eGFR 46.   Continue collaboration with CCM team.  Return in 4 weeks.      Relevant Medications   insulin detemir (LEVEMIR FLEXTOUCH) 100 UNIT/ML FlexPen   dapagliflozin propanediol (FARXIGA) 10 MG TABS tablet   Other Relevant Orders   Bayer DCA Hb A1c Waived   Hyperlipidemia  associated with type 2 diabetes mellitus (HCC)    Chronic, ongoing.  Continue current medication regimen and adjust as needed.  Lipid panel today.      Relevant Medications   insulin detemir (LEVEMIR FLEXTOUCH) 100 UNIT/ML FlexPen   dapagliflozin propanediol (FARXIGA) 10 MG TABS tablet   Other Relevant Orders   Bayer DCA Hb A1c Waived   Lipid Panel w/o Chol/HDL Ratio   CKD stage 3 due to type 2 diabetes mellitus (HCC)    Chronic, ongoing noted on past labs.  Urine ALB 10 today.  Continue Benazepril.  Recent eGFR 46, recheck CMP today.  A1c 9.1% today -- start [place] and educated him on this.  Return in 4 weeks.      Relevant Medications   insulin detemir (LEVEMIR FLEXTOUCH) 100 UNIT/ML FlexPen   dapagliflozin propanediol (FARXIGA) 10 MG TABS tablet   Other Relevant Orders   Comprehensive metabolic panel     Other   Morbid obesity (HCC)    BMI 35.33 with T2DM.  Recommended eating smaller high protein, low fat meals more frequently and exercising 30 mins a day 5 times a week with a goal of 10-15lb weight loss in the next 3 months. Patient voiced their understanding and motivation to adhere to these recommendations.       Relevant Medications   insulin detemir (LEVEMIR FLEXTOUCH) 100 UNIT/ML FlexPen   dapagliflozin propanediol (FARXIGA) 10 MG TABS tablet    Other Visit Diagnoses    B12 deficiency       History of lower levels, check today and start supplement  as needed.   Relevant Orders   Vitamin B12       Follow up plan: Return in about 4 weeks (around 08/18/2020) for T2DM -- added Farxiga last visit.

## 2020-07-21 NOTE — Assessment & Plan Note (Signed)
BMI 35.33 with T2DM.  Recommended eating smaller high protein, low fat meals more frequently and exercising 30 mins a day 5 times a week with a goal of 10-15lb weight loss in the next 3 months. Patient voiced their understanding and motivation to adhere to these recommendations.

## 2020-07-21 NOTE — Assessment & Plan Note (Signed)
Chronic, ongoing.  Continue current medication regimen and adjust as needed. Lipid panel today. 

## 2020-07-21 NOTE — Assessment & Plan Note (Signed)
Chronic, ongoing with A1c upward trend to 9.1%.  Tolerating insulin well.  Discussed at length BS goals with him and highly recommended he check these 3 times a day and attend all appointments as scheduled.  Will get eye exam in office today.  Continue Metformin and Levemir increase to 30 units.   Allergic to Ozempic with swelling presenting, will avoid GLP 1 family due to this.  Start Farxiga 10 MG daily and educated him on this, recent eGFR 46.   Continue collaboration with CCM team.  Return in 4 weeks.

## 2020-07-21 NOTE — Assessment & Plan Note (Signed)
Chronic, ongoing noted on past labs.  Urine ALB 10 today.  Continue Benazepril.  Recent eGFR 46, recheck CMP today.  A1c 9.1% today -- start [place] and educated him on this.  Return in 4 weeks.

## 2020-07-21 NOTE — Patient Instructions (Signed)
Diabetes Mellitus and Nutrition, Adult When you have diabetes, or diabetes mellitus, it is very important to have healthy eating habits because your blood sugar (glucose) levels are greatly affected by what you eat and drink. Eating healthy foods in the right amounts, at about the same times every day, can help you:  Control your blood glucose.  Lower your risk of heart disease.  Improve your blood pressure.  Reach or maintain a healthy weight. What can affect my meal plan? Every person with diabetes is different, and each person has different needs for a meal plan. Your health care provider may recommend that you work with a dietitian to make a meal plan that is best for you. Your meal plan may vary depending on factors such as:  The calories you need.  The medicines you take.  Your weight.  Your blood glucose, blood pressure, and cholesterol levels.  Your activity level.  Other health conditions you have, such as heart or kidney disease. How do carbohydrates affect me? Carbohydrates, also called carbs, affect your blood glucose level more than any other type of food. Eating carbs naturally raises the amount of glucose in your blood. Carb counting is a method for keeping track of how many carbs you eat. Counting carbs is important to keep your blood glucose at a healthy level, especially if you use insulin or take certain oral diabetes medicines. It is important to know how many carbs you can safely have in each meal. This is different for every person. Your dietitian can help you calculate how many carbs you should have at each meal and for each snack. How does alcohol affect me? Alcohol can cause a sudden decrease in blood glucose (hypoglycemia), especially if you use insulin or take certain oral diabetes medicines. Hypoglycemia can be a life-threatening condition. Symptoms of hypoglycemia, such as sleepiness, dizziness, and confusion, are similar to symptoms of having too much  alcohol.  Do not drink alcohol if: ? Your health care provider tells you not to drink. ? You are pregnant, may be pregnant, or are planning to become pregnant.  If you drink alcohol: ? Do not drink on an empty stomach. ? Limit how much you use to:  0-1 drink a day for women.  0-2 drinks a day for men. ? Be aware of how much alcohol is in your drink. In the [place] one drink equals one 12 oz bottle of beer (355 mL), one 5 oz glass of wine (148 mL), or one 1 oz glass of hard liquor (44 mL). ? Keep yourself hydrated with water, diet soda, or unsweetened iced tea.  Keep in mind that regular soda, juice, and other mixers may contain a lot of sugar and must be counted as carbs. What are tips for following this plan? Reading food labels  Start by checking the serving size on the "Nutrition Facts" label of packaged foods and drinks. The amount of calories, carbs, fats, and other nutrients listed on the label is based on one serving of the item. Many items contain more than one serving per package.  Check the total grams (g) of carbs in one serving. You can calculate the number of servings of carbs in one serving by dividing the total carbs by 15. For example, if a food has 30 g of total carbs per serving, it would be equal to 2 servings of carbs.  Check the number of grams (g) of saturated fats and trans fats in one serving. Choose foods that have   a low amount or none of these fats.  Check the number of milligrams (mg) of salt (sodium) in one serving. Most people should limit total sodium intake to less than 2,300 mg per day.  Always check the nutrition information of foods labeled as "low-fat" or "nonfat." These foods may be higher in added sugar or refined carbs and should be avoided.  Talk to your dietitian to identify your daily goals for nutrients listed on the label. Shopping  Avoid buying canned, pre-made, or processed foods. These foods tend to be high in fat, sodium, and added  sugar.  Shop around the outside edge of the grocery store. This is where you will most often find fresh fruits and vegetables, bulk grains, fresh meats, and fresh dairy. Cooking  Use low-heat cooking methods, such as baking, instead of high-heat cooking methods like deep frying.  Cook using healthy oils, such as olive, canola, or sunflower oil.  Avoid cooking with butter, cream, or high-fat meats. Meal planning  Eat meals and snacks regularly, preferably at the same times every day. Avoid going long periods of time without eating.  Eat foods that are high in fiber, such as fresh fruits, vegetables, beans, and whole grains. Talk with your dietitian about how many servings of carbs you can eat at each meal.  Eat 4-6 oz (112-168 g) of lean protein each day, such as lean meat, chicken, fish, eggs, or tofu. One ounce (oz) of lean protein is equal to: ? 1 oz (28 g) of meat, chicken, or fish. ? 1 egg. ?  cup (62 g) of tofu.  Eat some foods each day that contain healthy fats, such as avocado, nuts, seeds, and fish.   What foods should I eat? Fruits Berries. Apples. Oranges. Peaches. Apricots. Plums. Grapes. Mango. Papaya. Pomegranate. Kiwi. Cherries. Vegetables Lettuce. Spinach. Leafy greens, including kale, chard, collard greens, and mustard greens. Beets. Cauliflower. Cabbage. Broccoli. Carrots. Green beans. Tomatoes. Peppers. Onions. Cucumbers. Brussels sprouts. Grains Whole grains, such as whole-wheat or whole-grain bread, crackers, tortillas, cereal, and pasta. Unsweetened oatmeal. Quinoa. Brown or wild rice. Meats and other proteins Seafood. Poultry without skin. Lean cuts of poultry and beef. Tofu. Nuts. Seeds. Dairy Low-fat or fat-free dairy products such as milk, yogurt, and cheese. The items listed above may not be a complete list of foods and beverages you can eat. Contact a dietitian for more information. What foods should I avoid? Fruits Fruits canned with  syrup. Vegetables Canned vegetables. Frozen vegetables with butter or cream sauce. Grains Refined white flour and flour products such as bread, pasta, snack foods, and cereals. Avoid all processed foods. Meats and other proteins Fatty cuts of meat. Poultry with skin. Breaded or fried meats. Processed meat. Avoid saturated fats. Dairy Full-fat yogurt, cheese, or milk. Beverages Sweetened drinks, such as soda or iced tea. The items listed above may not be a complete list of foods and beverages you should avoid. Contact a dietitian for more information. Questions to ask a health care provider  Do I need to meet with a diabetes educator?  Do I need to meet with a dietitian?  What number can I call if I have questions?  When are the best times to check my blood glucose? Where to find more information:  American Diabetes Association: diabetes.org  Academy of Nutrition and Dietetics: www.eatright.org  National Institute of Diabetes and Digestive and Kidney Diseases: www.niddk.nih.gov  Association of Diabetes Care and Education Specialists: www.diabeteseducator.org Summary  It is important to have healthy eating   habits because your blood sugar (glucose) levels are greatly affected by what you eat and drink.  A healthy meal plan will help you control your blood glucose and maintain a healthy lifestyle.  Your health care provider may recommend that you work with a dietitian to make a meal plan that is best for you.  Keep in mind that carbohydrates (carbs) and alcohol have immediate effects on your blood glucose levels. It is important to count carbs and to use alcohol carefully. This information is not intended to replace advice given to you by your health care provider. Make sure you discuss any questions you have with your health care provider. Document Revised: 01/21/2019 Document Reviewed: 01/21/2019 Elsevier Patient Education  2021 Elsevier Inc.  

## 2020-07-22 LAB — COMPREHENSIVE METABOLIC PANEL
ALT: 30 IU/L (ref 0–44)
AST: 22 IU/L (ref 0–40)
Albumin/Globulin Ratio: 2.2 (ref 1.2–2.2)
Albumin: 4.3 g/dL (ref 3.7–4.7)
Alkaline Phosphatase: 86 IU/L (ref 44–121)
BUN/Creatinine Ratio: 19 (ref 10–24)
BUN: 25 mg/dL (ref 8–27)
Bilirubin Total: 0.3 mg/dL (ref 0.0–1.2)
CO2: 19 mmol/L — ABNORMAL LOW (ref 20–29)
Calcium: 8.9 mg/dL (ref 8.6–10.2)
Chloride: 97 mmol/L (ref 96–106)
Creatinine, Ser: 1.34 mg/dL — ABNORMAL HIGH (ref 0.76–1.27)
Globulin, Total: 2 g/dL (ref 1.5–4.5)
Glucose: 291 mg/dL — ABNORMAL HIGH (ref 65–99)
Potassium: 5 mmol/L (ref 3.5–5.2)
Sodium: 133 mmol/L — ABNORMAL LOW (ref 134–144)
Total Protein: 6.3 g/dL (ref 6.0–8.5)
eGFR: 56 mL/min/{1.73_m2} — ABNORMAL LOW (ref >59)

## 2020-07-22 LAB — LIPID PANEL W/O CHOL/HDL RATIO
Cholesterol, Total: 133 mg/dL (ref 100–199)
HDL: 42 mg/dL (ref >39)
LDL Chol Calc (NIH): 55 mg/dL (ref 0–99)
Triglycerides: 227 mg/dL — ABNORMAL HIGH (ref 0–149)
VLDL Cholesterol Cal: 36 mg/dL (ref 5–40)

## 2020-07-22 LAB — TSH: TSH: 1.61 u[IU]/mL (ref 0.450–4.500)

## 2020-07-22 LAB — VITAMIN B12: Vitamin B-12: 359 pg/mL (ref 232–1245)

## 2020-07-22 NOTE — Progress Notes (Signed)
Good morning, please let Matthew [name] know his labs have returned: - Cholesterol levels show an at goal LDL, bad cholesterol, but triglycerides mildly elevated -- continue Atorvastatin and we will adjusts as needed. - Thyroid lab is normal - Glucose, sugar, was elevated on labs as expected.  Kidney function continues to show some mild kidney disease we will monitor closely, continue Benazepril for kidney protection.  Also we need to work on getting your diabetes better controlled with an A1c under 7%.   - Sodium, salt, was a little low on labs, this can be related to the higher sugars, so we need to work on getting these down.  Please add just a little salt to diet daily to help get this in normal range.  Will recheck next visit. - Vitamin B12 is on lower end of normal -- I recommend adding a supplement with over the counter Vitamin B12 500 MCG daily to help with overall nervous system health.  Any questions? Keep being awesome!!  Thank you for allowing me to participate in your care.  I appreciate you. Kindest regards, Clifton Safley

## 2020-08-09 ENCOUNTER — Telehealth: Payer: Self-pay | Admitting: Internal Medicine

## 2020-08-09 ENCOUNTER — Other Ambulatory Visit: Payer: Self-pay | Admitting: Nurse Practitioner

## 2020-08-09 DIAGNOSIS — I10 Essential (primary) hypertension: Secondary | ICD-10-CM

## 2020-08-09 DIAGNOSIS — E7800 Pure hypercholesterolemia, unspecified: Secondary | ICD-10-CM

## 2020-08-09 NOTE — Telephone Encounter (Signed)
Matthew [name] with [facility] is calling to let md know the pt a1c in may was 9.1. Matthew [name] does not need callback just a courtesy call. She does not have the patient correct phone number on file. Pt has an appt with dr Matthew [name] on 08-18-2020

## 2020-08-18 ENCOUNTER — Ambulatory Visit: Payer: Medicare HMO | Admitting: Internal Medicine

## 2020-08-18 ENCOUNTER — Other Ambulatory Visit: Payer: Self-pay | Admitting: Nurse Practitioner

## 2020-08-18 DIAGNOSIS — E119 Type 2 diabetes mellitus without complications: Secondary | ICD-10-CM

## 2020-08-19 ENCOUNTER — Telehealth: Payer: Self-pay | Admitting: Pharmacist

## 2020-08-19 NOTE — Chronic Care Management (AMB) (Addendum)
    Chronic Care Management Pharmacy Assistant   Name: Matthew [name]  MRN: [id number] DOB: 05-16-1945   Reason for Encounter: Disease State Diabetes Mellitus    Recent office visits:  07/21/20-Jolene Jim Hogg, NP. General follow up. Eye exam completed. Levemir increase to 30 units. Start Farxiga 10 MG daily.Recommended Vitamin B12 over the counter for overall nervous system health. Labs ordered. Follow up in 4 weeks.  Recent consult visits:  None noted  Hospital visits:  None in previous 6 months  Medications: Outpatient Encounter Medications as of 08/19/2020  Medication Sig   ASPIRIN EC PO Take 81 mg by mouth daily.   atorvastatin (LIPITOR) 20 MG tablet Take 1 tablet (20 mg total) by mouth daily.   benazepril (LOTENSIN) 40 MG tablet Take 1 tablet (40 mg total) by mouth daily.   dapagliflozin propanediol (FARXIGA) 10 MG TABS tablet Take 1 tablet (10 mg total) by mouth daily before breakfast.   hydrochlorothiazide (MICROZIDE) 12.5 MG capsule Take 1 capsule (12.5 mg total) by mouth daily.   insulin detemir (LEVEMIR FLEXTOUCH) 100 UNIT/ML FlexPen Inject 30 units into the skin at bedtime.   Insulin Pen Needle 31G X 8 MM MISC Use one needle to administer insulin into skin nightly.   metFORMIN (GLUCOPHAGE) 1000 MG tablet Take 1 tablet (1,000 mg total) by mouth 2 (two) times daily with a meal.   Multiple Vitamins-Minerals (CENTRUM SILVER 50+MEN) TABS Take by mouth.   No facility-administered encounter medications on file as of 08/19/2020.   Current antihyperglycemic regimen:  Metformin 1000 mg take 1 tab twice daily Farxiga 10 mg take 1 tab daily Levememir 100 unit inject 30 units at bedtime  What recent interventions/DTPs have been made to improve glycemic control:  07/21/20-Jolene Cannady, NP-Start Farxiga 10 mg and increase 30 units of Levemr  Have there been any recent hospitalizations or ED visits since last visit with [facility]? No  Patient denies hypoglycemic symptoms, including  Pale, Sweaty, Shaky, Hungry, Nervous/irritable, and Vision changes  Patient denies hyperglycemic symptoms, including blurry vision, excessive thirst, fatigue, polyuria, and weakness  How often are you checking your blood sugar?        Patient states he does not check his blood sugar  What are your blood sugars ranging?  Fasting:  Before meals:  After meals:  Bedtime:   During the week, how often does your blood glucose drop below 70? Never  Are you checking your feet daily/regularly?        Patient states he does check his feet daily.  Adherence Review: Is the patient currently on a STATIN medication? Yes Is the patient currently on ACE/ARB medication? Yes Does the patient have >5 day gap between last estimated fill dates? No    Star Rating Drugs: Metformin 1000 mg Last filled:05/21/20 90 DS Atorvastatin 20 mg Farxiga 10 mg Last filled:08/18/20 100 DS Benazepril 40 mg Last filled:08/18/20 100 DS   Corrie Mckusick, [place]

## 2020-08-23 NOTE — Telephone Encounter (Signed)
Patient returning call.

## 2020-09-21 ENCOUNTER — Telehealth: Payer: Self-pay

## 2020-09-21 ENCOUNTER — Encounter: Payer: Self-pay | Admitting: Internal Medicine

## 2020-09-21 ENCOUNTER — Ambulatory Visit
Admission: RE | Admit: 2020-09-21 | Discharge: 2020-09-21 | Disposition: A | Discharge status: Home / Self Care | Payer: Medicare HMO | Source: Ambulatory Visit | Attending: Internal Medicine | Admitting: Internal Medicine

## 2020-09-21 ENCOUNTER — Other Ambulatory Visit: Payer: Self-pay

## 2020-09-21 ENCOUNTER — Ambulatory Visit
Admission: RE | Admit: 2020-09-21 | Discharge: 2020-09-21 | Disposition: A | Discharge status: Home / Self Care | Payer: Medicare HMO | Attending: Internal Medicine | Admitting: Internal Medicine

## 2020-09-21 ENCOUNTER — Ambulatory Visit (INDEPENDENT_AMBULATORY_CARE_PROVIDER_SITE_OTHER): Payer: Medicare HMO | Admitting: Internal Medicine

## 2020-09-21 VITALS — BP 124/79 | HR 77 | Temp 98.4°F | Ht 69.69 in | Wt 241.2 lb

## 2020-09-21 DIAGNOSIS — E119 Type 2 diabetes mellitus without complications: Secondary | ICD-10-CM | POA: Diagnosis not present

## 2020-09-21 DIAGNOSIS — M1990 Unspecified osteoarthritis, unspecified site: Secondary | ICD-10-CM

## 2020-09-21 DIAGNOSIS — M1612 Unilateral primary osteoarthritis, left hip: Secondary | ICD-10-CM | POA: Diagnosis not present

## 2020-09-21 DIAGNOSIS — Z79899 Other long term (current) drug therapy: Secondary | ICD-10-CM | POA: Diagnosis not present

## 2020-09-21 DIAGNOSIS — M1712 Unilateral primary osteoarthritis, left knee: Secondary | ICD-10-CM | POA: Diagnosis not present

## 2020-09-21 DIAGNOSIS — M1611 Unilateral primary osteoarthritis, right hip: Secondary | ICD-10-CM | POA: Diagnosis not present

## 2020-09-21 DIAGNOSIS — M1711 Unilateral primary osteoarthritis, right knee: Secondary | ICD-10-CM | POA: Diagnosis not present

## 2020-09-21 DIAGNOSIS — N400 Enlarged prostate without lower urinary tract symptoms: Secondary | ICD-10-CM | POA: Diagnosis not present

## 2020-09-21 DIAGNOSIS — M5136 Other intervertebral disc degeneration, lumbar region: Secondary | ICD-10-CM | POA: Diagnosis not present

## 2020-09-21 LAB — URINALYSIS, ROUTINE W REFLEX MICROSCOPIC
Bilirubin, UA: NEGATIVE
Ketones, UA: NEGATIVE
Leukocytes,UA: NEGATIVE
Nitrite, UA: NEGATIVE
Protein,UA: NEGATIVE
RBC, UA: NEGATIVE
Specific Gravity, UA: 1.015 (ref 1.005–1.030)
Urobilinogen, Ur: 0.2 mg/dL (ref 0.2–1.0)
pH, UA: 5 (ref 5.0–7.5)

## 2020-09-21 LAB — BAYER DCA HB A1C WAIVED: HB A1C (BAYER DCA - WAIVED): 9 % — ABNORMAL HIGH (ref <7.0)

## 2020-09-21 MED ORDER — LIDOCAINE 5 % EX PTCH: 1.0000 | MEDICATED_PATCH | CUTANEOUS | 1 refills | Status: DC

## 2020-09-21 NOTE — Telephone Encounter (Signed)
PA for Lidocaine patches 5% has been approved.

## 2020-09-21 NOTE — Progress Notes (Signed)
BP 124/79   Pulse 77   Temp 98.4 F (36.9 C) (Oral)   Ht 5' 9.69" (1.77 m)   Wt 241 lb 3.2 oz (109.4 kg)   SpO2 97%   BMI 34.92 kg/m    Subjective:    Patient ID: Matthew [name], male    DOB: January 21, 1946, 75 y.o.   MRN: [id number]  Chief Complaint  Patient presents with   Hypertension   Diabetes   Hyperlipidemia   Chronic Kidney Disease    HPI: Matthew [name] is a 75 y.o. male  Hip pain x 2 weeks.  Patient is here to establish care.  With his wife he has severe diabetes and he is in denial does not check his sugar since he is supposed to.  Per wife he does not comply with a diabetic diet either.  Per his verbal record his last fasting sugar was 200.  A1c checked today in the office was 9.0   Hypertension This is a chronic problem. Pertinent negatives include no blurred vision, chest pain or headaches.  Diabetes He presents for his follow-up diabetic visit. He has type 2 diabetes mellitus. His disease course has been worsening. Pertinent negatives for hypoglycemia include no headaches. Pertinent negatives for diabetes include no blurred vision, no chest pain and no foot ulcerations.  Hyperlipidemia This is a chronic problem. The problem is uncontrolled. Pertinent negatives include no chest pain.  Hip Pain  Incident onset: Complains of bilateral hip pain no  trauma. The incident occurred at home. Associated symptoms include an inability to bear weight. Pertinent negatives include no loss of motion, loss of sensation, muscle weakness, numbness or tingling.   Chief Complaint  Patient presents with   Hypertension   Diabetes   Hyperlipidemia   Chronic Kidney Disease    Relevant past medical, surgical, family and social history reviewed and updated as indicated. Interim medical history since our last visit reviewed. Allergies and medications reviewed and updated.  Review of Systems  Eyes:  Negative for blurred vision.  Cardiovascular:  Negative for chest pain.   Neurological:  Negative for tingling, numbness and headaches.   Per HPI unless specifically indicated above     Objective:    BP 124/79   Pulse 77   Temp 98.4 F (36.9 C) (Oral)   Ht 5' 9.69" (1.77 m)   Wt 241 lb 3.2 oz (109.4 kg)   SpO2 97%   BMI 34.92 kg/m   Wt Readings from Last 3 Encounters:  09/21/20 241 lb 3.2 oz (109.4 kg)  07/21/20 246 lb 3.2 oz (111.7 kg)  09/19/19 (!) 232 lb (105.2 kg)    Physical Exam Vitals and nursing note reviewed.  Constitutional:      General: He is not in acute distress.    Appearance: Normal appearance. He is not ill-appearing or diaphoretic.  HENT:     Head: Normocephalic and atraumatic.     Right Ear: Tympanic membrane and external ear normal. There is no impacted cerumen.     Left Ear: External ear normal.     Nose: No congestion or rhinorrhea.     Mouth/Throat:     Pharynx: No oropharyngeal exudate or posterior oropharyngeal erythema.  Eyes:     Conjunctiva/sclera: Conjunctivae normal.     Pupils: Pupils are equal, round, and reactive to light.  Cardiovascular:     Rate and Rhythm: Normal rate and regular rhythm.     Heart sounds: No murmur heard.   No friction rub. No  gallop.  Pulmonary:     Effort: No respiratory distress.     Breath sounds: No stridor. No wheezing or rhonchi.  Chest:     Chest wall: No tenderness.  Abdominal:     General: Abdomen is flat. Bowel sounds are normal.     Palpations: Abdomen is soft. There is no mass.     Tenderness: There is no abdominal tenderness.  Musculoskeletal:     Cervical back: Normal range of motion and neck supple. No rigidity or tenderness.     Left lower leg: No edema.  Skin:    General: Skin is warm and dry.  Neurological:     Mental Status: He is alert.    Results for orders placed or performed in visit on 07/28/20  HM DIABETES EYE EXAM  Result Value Ref Range   HM Diabetic Eye Exam No Retinopathy No Retinopathy        Current Outpatient Medications:    ASPIRIN EC  PO, Take 81 mg by mouth daily., Disp: , Rfl:    atorvastatin (LIPITOR) 20 MG tablet, Take 1 tablet (20 mg total) by mouth daily., Disp: 100 tablet, Rfl: 0   benazepril (LOTENSIN) 40 MG tablet, Take 1 tablet (40 mg total) by mouth daily., Disp: 100 tablet, Rfl: 0   dapagliflozin propanediol (FARXIGA) 10 MG TABS tablet, Take 1 tablet (10 mg total) by mouth daily before breakfast., Disp: 30 tablet, Rfl: 3   hydrochlorothiazide (MICROZIDE) 12.5 MG capsule, Take 1 capsule (12.5 mg total) by mouth daily., Disp: 90 capsule, Rfl: 1   insulin detemir (LEVEMIR FLEXTOUCH) 100 UNIT/ML FlexPen, Inject 30 units into the skin at bedtime., Disp: 15 mL, Rfl: 3   Insulin Pen Needle 31G X 8 MM MISC, Use one needle to administer insulin into skin nightly., Disp: 90 each, Rfl: 3   metFORMIN (GLUCOPHAGE) 1000 MG tablet, Take 1 tablet (1,000 mg total) by mouth 2 (two) times daily with a meal., Disp: 180 tablet, Rfl: 0   Multiple Vitamins-Minerals (CENTRUM SILVER 50+MEN) TABS, Take by mouth., Disp: , Rfl:     Assessment & Plan:  Dm : is on metformin 1000 mg  bid 20 units of levemir, is also farxiga 10 mg daily.  inCrease Levemir 25 units daily and then to 30 units gradually over the next 2 weeks  HAD AN ALLERGIC REACTION TO OZEMPIC seen ENT ? Enlarged lymph nodes. No GLPS Last a1c at 9.1 recheck HbA1c,  urine  microalbumin  diabetic diet plan given to pt  adviced regarding hypoglycemia and instructions given to pt today on how to prevent and treat the same if it were to occur. pt acknowledges the plan and voices understanding of the same.  exercise plan given and encouraged.   advice diabetic yearly podiatry, ophthalmology , nutritionist , dental check q 6 months,  Ref. Range 09/19/2019 09:16 07/21/2020 00:00 07/21/2020 09:49  Hemoglobin A1C Latest Ref Range: 4.8 - 5.6 % 7.5 (H)    HB A1C (BAYER DCA - WAIVED) Latest Ref Range: <7.0 %   9.1 (H)    2. Hip pain :  check xrays of swollen joints. consider referral to ortho   pt takes pain meds for such.    3. Knee pain   check xrays of swollen joints. consider referral to rheumatology pt takes pain meds for such.  4. HLD recheck FLP, check LFT's work on diet, SE of meds explained to pt. low fat and high fiber diet explained to pt.  5. CKD  Cr high last time, will recheck.   Problem List Items Addressed This Visit   None    Orders Placed This Encounter  Procedures   XR Pelvis 1-2 Views   DG HIP UNILAT W OR W/O PELVIS 2-3 VIEWS LEFT   DG HIP UNILAT W OR W/O PELVIS 2-3 VIEWS RIGHT   DG Knee Complete 4 Views Left   DG Knee Complete 4 Views Right   Bayer DCA Hb A1c Waived   Vitamin B12   Urinalysis, Routine w reflex microscopic   TSH   PSA   Lipid panel   CBC with Differential/Platelet   COMPLETE METABOLIC PANEL WITH GFR   CMP14+EGFR   AMB Referral to [facility] Coordinaton     Meds ordered this encounter  Medications   lidocaine (LIDODERM) 5 %    Sig: Place 1 patch onto the skin daily. Remove & Discard patch within 12 hours or as directed by MD    Dispense:  30 patch    Refill:  1     Follow up plan: No follow-ups on file.

## 2020-09-21 NOTE — Telephone Encounter (Signed)
PA started for Lidocaine 5% patches through cover my meds, awaiting determination

## 2020-09-21 NOTE — Progress Notes (Signed)
Pt has :   1. Moderate osteoarthritis of the bilateral hips. 2. 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  left knee shows worse than Right in terms of OA can do Left knee injeciton next week / this to start off with  Might need L spine xrays as he has DJD of such, will refer to ortho for hips and lower back pain  Please let him know thnx

## 2020-09-21 NOTE — Chronic Care Management (AMB) (Signed)
  Care Management   Note  09/21/2020 Name: Matthew [name] MRN: [id number] DOB: [birth year]  Matthew [name] is a 75 y.o. year old male who is a primary care patient of [name], Avanti, MD and is actively engaged with the care management team. I reached out to Matthew [name] by phone today to assist with re-scheduling a follow up visit with the RN Case Manager  Follow up plan: Telephone appointment with care management team member scheduled for:10/01/2020  Noreene Larsson, [place], [employer] [facility]  [place], [place] [address] Direct Dial: [phone] [email or web address]

## 2020-09-22 LAB — CBC WITH DIFFERENTIAL/PLATELET
Basophils Absolute: 0 10*3/uL (ref 0.0–0.2)
Basos: 0 %
EOS (ABSOLUTE): 0.2 10*3/uL (ref 0.0–0.4)
Eos: 2 %
Hematocrit: 44 % (ref 37.5–51.0)
Hemoglobin: 14.4 g/dL (ref 13.0–17.7)
Immature Grans (Abs): 0 10*3/uL (ref 0.0–0.1)
Immature Granulocytes: 0 %
Lymphocytes Absolute: 1.7 10*3/uL (ref 0.7–3.1)
Lymphs: 21 %
MCH: 29 pg (ref 26.6–33.0)
MCHC: 32.7 g/dL (ref 31.5–35.7)
MCV: 89 fL (ref 79–97)
Monocytes Absolute: 0.7 10*3/uL (ref 0.1–0.9)
Monocytes: 9 %
Neutrophils Absolute: 5.4 10*3/uL (ref 1.4–7.0)
Neutrophils: 68 %
Platelets: 270 10*3/uL (ref 150–450)
RBC: 4.96 x10E6/uL (ref 4.14–5.80)
RDW: 12.9 % (ref 11.6–15.4)
WBC: 8 10*3/uL (ref 3.4–10.8)

## 2020-09-22 LAB — LIPID PANEL
Chol/HDL Ratio: 3.8 ratio (ref 0.0–5.0)
Cholesterol, Total: 169 mg/dL (ref 100–199)
HDL: 45 mg/dL (ref >39)
LDL Chol Calc (NIH): 93 mg/dL (ref 0–99)
Triglycerides: 179 mg/dL — ABNORMAL HIGH (ref 0–149)
VLDL Cholesterol Cal: 31 mg/dL (ref 5–40)

## 2020-09-22 LAB — CMP14+EGFR
ALT: 21 IU/L (ref 0–44)
AST: 17 IU/L (ref 0–40)
Albumin/Globulin Ratio: 2 (ref 1.2–2.2)
Albumin: 4.7 g/dL (ref 3.7–4.7)
Alkaline Phosphatase: 74 IU/L (ref 44–121)
BUN/Creatinine Ratio: 22 (ref 10–24)
BUN: 31 mg/dL — ABNORMAL HIGH (ref 8–27)
Bilirubin Total: 0.3 mg/dL (ref 0.0–1.2)
CO2: 21 mmol/L (ref 20–29)
Calcium: 9.9 mg/dL (ref 8.6–10.2)
Chloride: 100 mmol/L (ref 96–106)
Creatinine, Ser: 1.42 mg/dL — ABNORMAL HIGH (ref 0.76–1.27)
Globulin, Total: 2.4 g/dL (ref 1.5–4.5)
Glucose: 155 mg/dL — ABNORMAL HIGH (ref 65–99)
Potassium: 5.1 mmol/L (ref 3.5–5.2)
Sodium: 138 mmol/L (ref 134–144)
Total Protein: 7.1 g/dL (ref 6.0–8.5)
eGFR: 52 mL/min/{1.73_m2} — ABNORMAL LOW (ref >59)

## 2020-09-22 LAB — TSH: TSH: 1.93 u[IU]/mL (ref 0.450–4.500)

## 2020-09-22 LAB — PSA: Prostate Specific Ag, Serum: 1.1 ng/mL (ref 0.0–4.0)

## 2020-09-22 LAB — VITAMIN B12: Vitamin B-12: 529 pg/mL (ref 232–1245)

## 2020-10-01 ENCOUNTER — Ambulatory Visit (INDEPENDENT_AMBULATORY_CARE_PROVIDER_SITE_OTHER): Payer: Medicare HMO | Admitting: General Practice

## 2020-10-01 ENCOUNTER — Telehealth: Payer: Medicare HMO | Admitting: General Practice

## 2020-10-01 DIAGNOSIS — Z794 Long term (current) use of insulin: Secondary | ICD-10-CM | POA: Diagnosis not present

## 2020-10-01 DIAGNOSIS — E1165 Type 2 diabetes mellitus with hyperglycemia: Secondary | ICD-10-CM | POA: Diagnosis not present

## 2020-10-01 DIAGNOSIS — E1169 Type 2 diabetes mellitus with other specified complication: Secondary | ICD-10-CM | POA: Diagnosis not present

## 2020-10-01 DIAGNOSIS — E1159 Type 2 diabetes mellitus with other circulatory complications: Secondary | ICD-10-CM

## 2020-10-01 DIAGNOSIS — I152 Hypertension secondary to endocrine disorders: Secondary | ICD-10-CM | POA: Diagnosis not present

## 2020-10-01 DIAGNOSIS — E785 Hyperlipidemia, unspecified: Secondary | ICD-10-CM

## 2020-10-01 DIAGNOSIS — E119 Type 2 diabetes mellitus without complications: Secondary | ICD-10-CM | POA: Diagnosis not present

## 2020-10-01 DIAGNOSIS — M1990 Unspecified osteoarthritis, unspecified site: Secondary | ICD-10-CM

## 2020-10-01 NOTE — Patient Instructions (Signed)
Visit Information   PATIENT GOALS:   Goals Addressed             This Visit's Progress    RNCM: Cope with Chronic Pain       Timeframe:  Long-Range Goal Priority:  High Start Date:      10-01-2020                       Expected End Date:     10-01-2021                  Follow Up Date 12/03/2020    - learn how to meditate - learn relaxation techniques - practice acceptance of chronic pain - practice relaxation or meditation daily - spend time with positive people - tell myself I can (not I can't) - think of new ways to do favorite things - use distraction techniques - use relaxation during pain -follow recommendations of the provider -take medications as prescribed     Why is this important?   Stress makes chronic pain feel worse.  Feelings like depression, anxiety, stress and anger can make your body more sensitive to pain.  Learning ways to cope with stress or depression may help you find some relief from the pain.     Notes: 10-01-2020: The patient is experiencing pain in his hips and knees. Xrays reveal he has arthritis. Will follow up with the pcp on 10-06-2020 for evaluation and recommendations. The patient is hopeful that he will get some cortisone shots.      COMPLETED: RNCM: I need help with getting my Diabetes under control       Current Barriers: Closing this goal and opening in new ELS- New referral from the pcp on 09-21-2020 Chronic Disease Management support and education needs related to DM, HLD, and HTN  Nurse Case Manager Clinical Goal(s):  Over the next 90 days, patient will verbalize understanding of plan for DM, HLD, and HTN management  Over the next 90 days days, patient will demonstrate a decrease in hyperglycemic exacerbations as evidenced by decreased blood glucose levels and Hemoglobin A1C 14.  Newest hemoglobin A1C 7.3 Over the next 90 days, patient will attend all scheduled medical appointments: next appointment with pcp on 09-19-2019 Over the next 90  days, patient will demonstrate improved adherence to prescribed treatment plan for DM as evidenced by improved signs and symptoms of hyperglycemia, blood sugar levels decreasing <200's, improved Hemoglobin A1C Over the next 90 days, patient will demonstrate improved health management independence as evidenced by following a heart healthy/ADA Carb modified diet Over the next 90 days, patient will demonstrate understanding of rationale for each prescribed medication as evidenced by taking medications as prescribed and talking to the pcp before adjusting insulin doses  Interventions:  Evaluation of current treatment plan related to DM, HLD, and HTN and patient's adherence to plan as established by provider. Advised patient to call pcp for Blood sugar readings <90 or >400.  09-10-2019: the patient states his blood sugars are ranging from 110 to 140.  Is not checking daily but is watching what he eats and staying active.  Provided education to patient re: ADA/ carb modified diet; the patient states he has cut out all sweets. He is trying to watch carbohydrates.  He is not sure if he has lost weight or not but feels great 09-10-2019: Endorses heart healthy/ADA diet. Denies any issues at this time with dietary restrictions.  Reviewed medications with the patient- co-visit  with the CCM pharmacist,  The patient to talk to pcp about the high dose of ASA he is taking daily. Completed Co-Visit with pharmacist via telephone today for review and assistance with Chronic Disease management and care.  09-10-2019: The patient continues to work with the CCM team at this time.  Discussed plans with patient for ongoing care management follow up and provided patient with direct contact information for care management team Provided patient and/or caregiver with COVID19 information about community resources- per the patient he will receive his first COVID19 vaccine on 04-24-2019- completed.  Provided patient with foot care  educational materials related to slower wound healing in patients with DM and also other complication related to other chronic disease processes of HLD and HTN Advised patient, providing education and rationale, to check cbg three times a day and record, calling pcp for findings outside established parameters.   Provided the patient with samples of Glucerna with coupons for pick up today at the office. Completed   Patient Self Care Activities:  Patient verbalizes understanding of plan to work with the interdisciplinary team to improve diabetes management and other chronic conditions of HLD and HTN Self administers medications as prescribed Attends all scheduled provider appointments Unable to independently manage Chronic disease processes of DM, HLD, and HTN  Please see past updates related to this goal by clicking on the "Past Updates" button in the selected goal       RNCM: Manage My Emotions       Timeframe:  Short-Term Goal Priority:  Medium Start Date:         10-01-2020                    Expected End Date:       02-15-2021                Follow Up Date 12/03/2020    - call and visit an old friend - laugh; watch a funny movie or comedian - learn and use visualization or guided imagery - perform a random act of kindness - practice relaxation or meditation daily - talk about feelings with a friend, family or spiritual advisor - practice positive thinking and self-talk    Why is this important?   When you are stressed, down or upset, your body reacts too.  For example, your blood pressure may get higher; you may have a headache or stomachache.  When your emotions get the best of you, your body's ability to fight off cold and flu gets weak.  These steps will help you manage your emotions.     Notes: 10-01-2020: The patient is optimistic and hopeful the pcp can help with the pain and he would feel a lot better. Having a little bit of a hard time due to the chronic pain issues he is  having right now.      RNCM: Monitor and Manage My Blood Sugar-Diabetes Type 2       Timeframe:  Long-Range Goal Priority:  High Start Date:       10-01-2020                      Expected End Date:    10-01-2021                   Follow Up Date 12/03/2020    - check blood sugar at prescribed times - check blood sugar before and after exercise - check blood sugar if  I feel it is too high or too low - enter blood sugar readings and medication or insulin into daily log - take the blood sugar log to all doctor visits - take the blood sugar meter to all doctor visits    Why is this important?   Checking your blood sugar at home helps to keep it from getting very high or very low.  Writing the results in a diary or log helps the doctor know how to care for you.  Your blood sugar log should have the time, date and the results.  Also, write down the amount of insulin or other medicine that you take.  Other information, like what you ate, exercise done and how you were feeling, will also be helpful.     Notes: 10-01-2020: The patient is checking his blood sugars regularly and he has made dietary changes. His wife is helping him with food options. He states compliance with his medications. Blood sugar readings are 130 to 170 lately with one high reading at 297 one day last week and it was one hour after eating. Review of the goal of fasting <130 and 2 hours after eating <180. Will continue to monitor.      RNCM: Set My Target A1C-Diabetes Type 2       Timeframe:  Long-Range Goal Priority:  High Start Date:      10-01-2020                       Expected End Date:  10-01-2021                     Follow Up Date 12/03/2020    - set target A1C    Why is this important?   Your target A1C is decided together by you and your doctor.  It is based on several things like your age and other health issues.    Notes: 10-01-2020: Reviewed with the patient the goal of hemoglobin A1C of 7.0 or less. 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   Priority: High     Long-Range Goal: RNCM: Management of HTN, HLD, DM2, and Chronic pain in bilateral knees and hips   Start Date: 10/01/2020  Expected End Date: 10/01/2021  This Visit's Progress: On track  Priority: High  Note:   Current Barriers:  Knowledge Deficits related to plan of care for management of HTN, HLD, DMII, and Chronic pain   Care Coordination needs related to Level of care concerns in a patient with HTN, HLD, DMII, and Chronic pain  Chronic Disease Management support and education needs related to HTN, HLD, DMII, and Chronic pain   RNCM Clinical Goal(s):  Patient will verbalize understanding of plan for management of HTN, HLD, DMII, and Chronic pain  work with RN Case Manager to address needs related to HTN, HLD, DMII, and chronic pain  and Level of care concerns take all medications exactly as prescribed and will call provider for medication related questions attend all scheduled medical appointments: 10-06-2020 at 10 am meet with RN Care Manager to address chronic conditions, give educational material and ongoing support for effective management of multiple chronic condtions.  demonstrate a decrease  in HTN, HLD, DMII, and Chronic pain  exacerbations as evidence of chronic conditions under control and improved pain levels  demonstrate improved adherence to prescribed treatment plan for HTN, HLD, DMII, and Chronic pain as evidenced by daily monitoring and recording of CBG  adherence to ADA/ carb modified diet adherence to prescribed medication regimen contacting provider for new or worsened symptoms or questions related to changes in condition or unresolved pain and discomfort  demonstrate improved health management  independence of chronic conditions by working with the pcp and CCM team to optimize health and well being verbalize basic understanding of HTN, HLD, DMII, and chronic pain disease process and self health management plan to effectively manage chronic conditions independently demonstrate understanding of rationale for each prescribed medication for chronic conditions of HTN, HLD, DM, and chronic pain   through collaboration with RN Care manager, provider, and care team.   Interventions: 1:1 collaboration with primary care provider regarding development and update of comprehensive plan of care as evidenced by provider attestation and co-signature Inter-disciplinary care team collaboration (see longitudinal plan of care) Evaluation of current treatment plan related to  self management and patient's adherence to plan as established by provider;   Diabetes:  (Status: New goal. Goal on track: YES.) Lab Results  Component Value Date   HGBA1C 9.0 (H) 09/21/2020  Assessed patient's understanding of A1c goal: <7% Provided education to patient about basic DM disease process; Reviewed medications with patient and discussed importance of medication adherence. The patient states compliance with medications ;        Reviewed prescribed diet with patient Heart healthy/ADA diet, states his wife is helping him with dietary restrictions and is staying on him when he does not eat healthy; Counseled on importance of regular laboratory monitoring as prescribed;        Discussed plans with patient for ongoing care management follow up and provided patient with direct contact information for care management team;      Provided patient with written educational materials related to hypo and hyperglycemia and importance of correct treatment;       Reviewed scheduled/upcoming provider appointments including: 10-06-2020 at 10 am;         Advised patient, providing education and rationale, to check cbg BID and record . The  patient states that his blood sugars are usually 130-170. He had one recently an hour after eating that was 297. The patient feels he is doing better with his diabetes management. Review of fasting bloods sugars of <130 and post prandial of <180.      call provider for findings outside established parameters;       Review of patient status, including review of consultants reports, relevant laboratory and other test results, and medications completed;       Screening for signs and symptoms of depression related to chronic disease state;        Assessed social determinant of health barriers;         Hyperlipidemia:  (Status: New goal. Goal on track: YES.) Lab Results  Component Value Date   CHOL 169 09/21/2020   HDL 45 09/21/2020   LDLCALC 93 09/21/2020   TRIG 179 (H) 09/21/2020   CHOLHDL 3.8 09/21/2020     Medication review performed; medication list updated in electronic medical record.  Provider established cholesterol goals reviewed; Counseled on importance of regular laboratory monitoring as prescribed; Provided HLD educational materials; Reviewed role and benefits of statin for ASCVD risk reduction; Discussed strategies to manage statin-induced  myalgias; Reviewed importance of limiting foods high in cholesterol;  Hypertension: (Status: New goal. Goal on track: YES.) Last practice recorded BP readings:  BP Readings from Last 3 Encounters:  09/21/20 124/79  07/21/20 123/66  09/19/19 126/69  Most recent eGFR/CrCl:  Lab Results  Component Value Date   EGFR 52 (L) 09/21/2020    No components found for: CRCL  Evaluation of current treatment plan related to hypertension self management and patient's adherence to plan as established by provider;   Provided education to patient re: stroke prevention, s/s of heart attack and stroke; Reviewed prescribed diet of heart healthy/ADA diet  Reviewed medications with patient and discussed importance of compliance;  Discussed plans with  patient for ongoing care management follow up and provided patient with direct contact information for care management team; Advised patient, providing education and rationale, to monitor blood pressure daily and record, calling PCP for findings outside established parameters;  Reviewed scheduled/upcoming provider appointments including:  Provided education on prescribed diet review of heart healthy/ADA diet, the patient is compliant with dietary restrictions at this time;  Discussed complications of poorly controlled blood pressure such as heart disease, stroke, circulatory complications, vision complications, kidney impairment, sexual dysfunction;   Pain:  (Status: New goal. Goal on track: NO.) Pain assessment performed Medications reviewed Reviewed provider established plan for pain management; Discussed importance of adherence to all scheduled medical appointments, the patient states he is taking ibuprofen for pain relief. The patient states the lidocaine patches have not been effective for pain control. The patient states he can hardly walk.  Counseled on the importance of reporting any/all new or changed pain symptoms or management strategies to pain management provider; Advised patient to report to care team affect of pain on daily activities; Discussed use of relaxation techniques and/or diversional activities to assist with pain reduction (distraction, imagery, relaxation, massage, acupressure, TENS, heat, and cold application; Reviewed with patient prescribed pharmacological and nonpharmacological pain relief strategies; Advised patient to discuss other pain treatment options with provider. The patient is going to ask the pcp about cortisone shots at his appointment on 10-06-2020. He states he can hardly walk. Review of safety and fall prevention;  Patient Goals/Self-Care Activities: Patient will self administer medications as prescribed Patient will attend all scheduled provider  appointments Patient will call pharmacy for medication refills Patient will attend church or other social activities Patient will continue to perform ADL's independently Patient will continue to perform IADL's independently Patient will call provider office for new concerns or questions Patient will work with BSW to address care coordination needs and will continue to work with the clinical team to address health care and disease management related needs.         Consent to CCM Services: Mr. Hue was given information about Chronic Care Management services including:  CCM service includes personalized support from designated clinical staff supervised by his physician, including individualized plan of care and coordination with other care providers 24/7 contact phone numbers for assistance for urgent and routine care needs. Service will only be billed when office clinical staff spend 20 minutes or more in a month to coordinate care. Only one practitioner may furnish and bill the service in a calendar month. The patient may stop CCM services at any time (effective at the end of the month) by phone call to the office staff. The patient will be responsible for cost sharing (co-pay) of up to 20% of the service fee (after annual deductible is met).  Patient agreed  to services and verbal consent obtained.   The patient verbalized understanding of instructions, educational materials, and care plan provided today and declined offer to receive copy of patient instructions, educational materials, and care plan.   Telephone follow up appointment with care management team member scheduled for:12-03-2020 at 1 pm  Noreene Larsson RN, MSN, [facility] Mobile: [phone]

## 2020-10-01 NOTE — Chronic Care Management (AMB) (Signed)
Chronic Care Management   CCM RN Visit Note  10/01/2020 Name: Matthew [name] MRN: [id number] DOB: 10/22/1945  Subjective: Matthew [name] is a 75 y.o. year old male who is a primary care patient of [name], Avanti, MD. The care management team was consulted for assistance with disease management and care coordination needs.    Engaged with patient by telephone for initial visit in response to provider referral for case management and/or care coordination services.   Consent to Services:  The patient was given the following information about Chronic Care Management services today, agreed to services, and gave verbal consent: 1. CCM service includes personalized support from designated clinical staff supervised by the primary care provider, including individualized plan of care and coordination with other care providers 2. 24/7 contact phone numbers for assistance for urgent and routine care needs. 3. Service will only be billed when office clinical staff spend 20 minutes or more in a month to coordinate care. 4. Only one practitioner may furnish and bill the service in a calendar month. 5.The patient may stop CCM services at any time (effective at the end of the month) by phone call to the office staff. 6. The patient will be responsible for cost sharing (co-pay) of up to 20% of the service fee (after annual deductible is met). Patient agreed to services and consent obtained.  Patient agreed to services and verbal consent obtained.   Assessment: Review of patient past medical history, allergies, medications, health status, including review of consultants reports, laboratory and other test data, was performed as part of comprehensive evaluation and provision of chronic care management services.   SDOH (Social Determinants of Health) assessments and interventions performed:  SDOH Interventions    Flowsheet Row Most Recent Value  SDOH Interventions   Physical Activity Interventions Other (Comments)   [patient can hardly walk right now due to pain in his hips and knees, has had testing, sees pcp this week]  Stress Interventions Other (Comment)  [Is concerned about this pain in his hips, knees and legs]        CCM Care Plan  Allergies  Allergen Reactions   Ozempic (0.25 Or 0.5 Mg-Dose) [Semaglutide(0.25 Or 0.76m-Dos)] Swelling    Outpatient Encounter Medications as of 10/01/2020  Medication Sig   ibuprofen (ADVIL) 400 MG tablet Take 400 mg by mouth every 6 (six) hours as needed for moderate pain. Per patient he has been taking to help with pain in his hips and knees   ASPIRIN EC PO Take 81 mg by mouth daily.   atorvastatin (LIPITOR) 20 MG tablet Take 1 tablet (20 mg total) by mouth daily.   benazepril (LOTENSIN) 40 MG tablet Take 1 tablet (40 mg total) by mouth daily.   dapagliflozin propanediol (FARXIGA) 10 MG TABS tablet Take 1 tablet (10 mg total) by mouth daily before breakfast.   hydrochlorothiazide (MICROZIDE) 12.5 MG capsule Take 1 capsule (12.5 mg total) by mouth daily.   insulin detemir (LEVEMIR FLEXTOUCH) 100 UNIT/ML FlexPen Inject 30 units into the skin at bedtime.   Insulin Pen Needle 31G X 8 MM MISC Use one needle to administer insulin into skin nightly.   lidocaine (LIDODERM) 5 % Place 1 patch onto the skin daily. Remove & Discard patch within 12 hours or as directed by MD   metFORMIN (GLUCOPHAGE) 1000 MG tablet Take 1 tablet (1,000 mg total) by mouth 2 (two) times daily with a meal.   Multiple Vitamins-Minerals (CENTRUM SILVER 50+MEN) TABS Take by mouth.  No facility-administered encounter medications on file as of 10/01/2020.    Patient Active Problem List   Diagnosis Date Noted   Morbid obesity ([place]) 07/17/2020   CKD stage 3 due to type 2 diabetes mellitus ([place]) 03/13/2020   BPH (benign prostatic hyperplasia) 12/30/2014   Type 2 diabetes mellitus with hyperglycemia ([place])    Hyperlipidemia associated with type 2 diabetes mellitus ([place])    Hypertension associated  with diabetes ([place])     Conditions to be addressed/monitored:HTN, HLD, DMII, and Chronic pain   Care Plan : RNCM: Management of HTN, HLD, DM2, and Chronic pain in bilateral knees and hips  Updates made by Vanita Ingles since 10/01/2020 12:00 AM     Problem: RNCM: Management of HTN, HLD, DM2, and Chronic pain in bilateral knees and hips   Priority: High     Long-Range Goal: RNCM: Management of HTN, HLD, DM2, and Chronic pain in bilateral knees and hips   Start Date: 10/01/2020  Expected End Date: 10/01/2021  This Visit's Progress: On track  Priority: High  Note:   Current Barriers:  Knowledge Deficits related to plan of care for management of HTN, HLD, DMII, and Chronic pain   Care Coordination needs related to Level of care concerns in a patient with HTN, HLD, DMII, and Chronic pain  Chronic Disease Management support and education needs related to HTN, HLD, DMII, and Chronic pain   RNCM Clinical Goal(s):  Patient will verbalize understanding of plan for management of HTN, HLD, DMII, and Chronic pain  work with RN Case Manager to address needs related to HTN, HLD, DMII, and chronic pain  and Level of care concerns take all medications exactly as prescribed and will call provider for medication related questions attend all scheduled medical appointments: 10-06-2020 at 10 am meet with RN Care Manager to address chronic conditions, give educational material and ongoing support for effective management of multiple chronic condtions.  demonstrate a decrease in HTN, HLD, DMII, and Chronic pain  exacerbations as evidence of chronic conditions under control and improved pain levels  demonstrate improved adherence to prescribed treatment plan for HTN, HLD, DMII, and Chronic pain as evidenced by daily monitoring and recording of CBG  adherence to ADA/ carb modified diet adherence to prescribed medication regimen contacting provider for new or worsened symptoms or questions related to changes in  condition or unresolved pain and discomfort  demonstrate improved health management independence of chronic conditions by working with the pcp and CCM team to optimize health and well being verbalize basic understanding of HTN, HLD, DMII, and chronic pain disease process and self health management plan to effectively manage chronic conditions independently demonstrate understanding of rationale for each prescribed medication for chronic conditions of HTN, HLD, DM, and chronic pain   through collaboration with RN Care manager, provider, and care team.   Interventions: 1:1 collaboration with primary care provider regarding development and update of comprehensive plan of care as evidenced by provider attestation and co-signature Inter-disciplinary care team collaboration (see longitudinal plan of care) Evaluation of current treatment plan related to  self management and patient's adherence to plan as established by provider;   Diabetes:  (Status: New goal. Goal on track: YES.) Lab Results  Component Value Date   HGBA1C 9.0 (H) 09/21/2020  Assessed patient's understanding of A1c goal: <7% Provided education to patient about basic DM disease process; Reviewed medications with patient and discussed importance of medication adherence. The patient states compliance with medications ;  Reviewed prescribed diet with patient Heart healthy/ADA diet, states his wife is helping him with dietary restrictions and is staying on him when he does not eat healthy; Counseled on importance of regular laboratory monitoring as prescribed;        Discussed plans with patient for ongoing care management follow up and provided patient with direct contact information for care management team;      Provided patient with written educational materials related to hypo and hyperglycemia and importance of correct treatment;       Reviewed scheduled/upcoming provider appointments including: 10-06-2020 at 10 am;          Advised patient, providing education and rationale, to check cbg BID and record . The patient states that his blood sugars are usually 130-170. He had one recently an hour after eating that was 297. The patient feels he is doing better with his diabetes management. Review of fasting bloods sugars of <130 and post prandial of <180.      call provider for findings outside established parameters;       Review of patient status, including review of consultants reports, relevant laboratory and other test results, and medications completed;       Screening for signs and symptoms of depression related to chronic disease state;        Assessed social determinant of health barriers;         Hyperlipidemia:  (Status: New goal. Goal on track: YES.) Lab Results  Component Value Date   CHOL 169 09/21/2020   HDL 45 09/21/2020   LDLCALC 93 09/21/2020   TRIG 179 (H) 09/21/2020   CHOLHDL 3.8 09/21/2020     Medication review performed; medication list updated in electronic medical record.  Provider established cholesterol goals reviewed; Counseled on importance of regular laboratory monitoring as prescribed; Provided HLD educational materials; Reviewed role and benefits of statin for ASCVD risk reduction; Discussed strategies to manage statin-induced myalgias; Reviewed importance of limiting foods high in cholesterol;  Hypertension: (Status: New goal. Goal on track: YES.) Last practice recorded BP readings:  BP Readings from Last 3 Encounters:  09/21/20 124/79  07/21/20 123/66  09/19/19 126/69  Most recent eGFR/CrCl:  Lab Results  Component Value Date   EGFR 52 (L) 09/21/2020    No components found for: CRCL  Evaluation of current treatment plan related to hypertension self management and patient's adherence to plan as established by provider;   Provided education to patient re: stroke prevention, s/s of heart attack and stroke; Reviewed prescribed diet of heart healthy/ADA diet  Reviewed  medications with patient and discussed importance of compliance;  Discussed plans with patient for ongoing care management follow up and provided patient with direct contact information for care management team; Advised patient, providing education and rationale, to monitor blood pressure daily and record, calling PCP for findings outside established parameters;  Reviewed scheduled/upcoming provider appointments including:  Provided education on prescribed diet review of heart healthy/ADA diet, the patient is compliant with dietary restrictions at this time;  Discussed complications of poorly controlled blood pressure such as heart disease, stroke, circulatory complications, vision complications, kidney impairment, sexual dysfunction;   Pain:  (Status: New goal. Goal on track: NO.) Pain assessment performed Medications reviewed Reviewed provider established plan for pain management; Discussed importance of adherence to all scheduled medical appointments, the patient states he is taking ibuprofen for pain relief. The patient states the lidocaine patches have not been effective for pain control. The patient states he can hardly walk.  Counseled on the importance of reporting any/all new or changed pain symptoms or management strategies to pain management provider; Advised patient to report to care team affect of pain on daily activities; Discussed use of relaxation techniques and/or diversional activities to assist with pain reduction (distraction, imagery, relaxation, massage, acupressure, TENS, heat, and cold application; Reviewed with patient prescribed pharmacological and nonpharmacological pain relief strategies; Advised patient to discuss other pain treatment options with provider. The patient is going to ask the pcp about cortisone shots at his appointment on 10-06-2020. He states he can hardly walk. Review of safety and fall prevention;  Patient Goals/Self-Care Activities: Patient will self  administer medications as prescribed Patient will attend all scheduled provider appointments Patient will call pharmacy for medication refills Patient will attend church or other social activities Patient will continue to perform ADL's independently Patient will continue to perform IADL's independently Patient will call provider office for new concerns or questions Patient will work with BSW to address care coordination needs and will continue to work with the clinical team to address health care and disease management related needs.         Plan:Telephone follow up appointment with care management team member scheduled for:  12-03-2020 at 1 pm  Noreene Larsson RN, MSN, [facility] Mobile: [phone]

## 2020-10-06 ENCOUNTER — Other Ambulatory Visit: Payer: Self-pay

## 2020-10-06 ENCOUNTER — Ambulatory Visit (INDEPENDENT_AMBULATORY_CARE_PROVIDER_SITE_OTHER): Payer: Medicare HMO | Admitting: Internal Medicine

## 2020-10-06 ENCOUNTER — Encounter: Payer: Self-pay | Admitting: Internal Medicine

## 2020-10-06 VITALS — BP 123/77 | HR 85 | Temp 98.5°F | Ht 69.69 in | Wt 239.0 lb

## 2020-10-06 DIAGNOSIS — M25561 Pain in right knee: Secondary | ICD-10-CM

## 2020-10-06 DIAGNOSIS — I152 Hypertension secondary to endocrine disorders: Secondary | ICD-10-CM | POA: Diagnosis not present

## 2020-10-06 DIAGNOSIS — G8929 Other chronic pain: Secondary | ICD-10-CM

## 2020-10-06 DIAGNOSIS — M1990 Unspecified osteoarthritis, unspecified site: Secondary | ICD-10-CM

## 2020-10-06 DIAGNOSIS — N1831 Chronic kidney disease, stage 3a: Secondary | ICD-10-CM | POA: Insufficient documentation

## 2020-10-06 DIAGNOSIS — E1159 Type 2 diabetes mellitus with other circulatory complications: Secondary | ICD-10-CM

## 2020-10-06 MED ORDER — LEVEMIR FLEXTOUCH 100 UNIT/ML ~~LOC~~ SOPN: PEN_INJECTOR | SUBCUTANEOUS | 3 refills | Status: DC

## 2020-10-06 MED ORDER — TRAMADOL HCL 50 MG PO TABS
50.0000 mg | ORAL_TABLET | Freq: Three times a day (TID) | ORAL | 0 refills | Status: AC | PRN
Start: 2020-10-06 — End: 2020-10-11

## 2020-10-06 MED ORDER — METHYLPREDNISOLONE 4 MG PO TBPK: ORAL_TABLET | ORAL | 0 refills | Status: DC

## 2020-10-06 MED ORDER — TRIAMCINOLONE ACETONIDE 40 MG/ML IJ SUSP
40.0000 mg | Freq: Once | INTRAMUSCULAR | Status: AC
  Administered 2020-10-06: 40 mg via INTRAMUSCULAR

## 2020-10-06 NOTE — Addendum Note (Signed)
Addended by: Donzetta Kohut A on: 10/06/2020 12:53 PM   Modules accepted: Orders

## 2020-10-06 NOTE — Progress Notes (Signed)
BP 123/77   Pulse 85   Temp 98.5 F (36.9 C) (Oral)   Ht 5' 9.69" (1.77 m)   Wt 239 lb (108.4 kg)   SpO2 98%   BMI 34.60 kg/m    Subjective:    Patient ID: Matthew [name], male    DOB: 01/27/1946, 75 y.o.   MRN: [id number]  Chief Complaint  Patient presents with   Knee Pain   Hip Pain   Diabetes    HPI: Matthew [name] is a 75 y.o. male  Knee Pain  The incident occurred more than 1 week ago.  Hip Pain  The incident occurred more than 1 week ago.  Diabetes He presents for his follow-up (a1c -9.1) diabetic visit. He has type 2 diabetes mellitus. His disease course has been improving. Pertinent negatives for diabetes include no blurred vision, no chest pain, no fatigue, no foot paresthesias, no foot ulcerations, no polydipsia, no polyphagia, no polyuria, no visual change, no weakness and no weight loss.   Chief Complaint  Patient presents with   Knee Pain   Hip Pain   Diabetes    Relevant past medical, surgical, family and social history reviewed and updated as indicated. Interim medical history since our last visit reviewed. Allergies and medications reviewed and updated.  Review of Systems  Constitutional:  Negative for fatigue and weight loss.  Eyes:  Negative for blurred vision.  Cardiovascular:  Negative for chest pain.  Endocrine: Negative for polydipsia, polyphagia and polyuria.  Neurological:  Negative for weakness.   Per HPI unless specifically indicated above     Objective:    BP 123/77   Pulse 85   Temp 98.5 F (36.9 C) (Oral)   Ht 5' 9.69" (1.77 m)   Wt 239 lb (108.4 kg)   SpO2 98%   BMI 34.60 kg/m   Wt Readings from Last 3 Encounters:  10/06/20 239 lb (108.4 kg)  09/21/20 241 lb 3.2 oz (109.4 kg)  07/21/20 246 lb 3.2 oz (111.7 kg)    Physical Exam Vitals and nursing note reviewed.  Constitutional:      General: He is not in acute distress.    Appearance: Normal appearance. He is not ill-appearing or diaphoretic.  HENT:     Head:  Normocephalic and atraumatic.     Right Ear: Tympanic membrane and external ear normal. There is no impacted cerumen.     Left Ear: External ear normal.     Nose: No congestion or rhinorrhea.     Mouth/Throat:     Pharynx: No oropharyngeal exudate or posterior oropharyngeal erythema.  Eyes:     Conjunctiva/sclera: Conjunctivae normal.     Pupils: Pupils are equal, round, and reactive to light.  Cardiovascular:     Rate and Rhythm: Normal rate and regular rhythm.     Heart sounds: No murmur heard.   No friction rub. No gallop.  Pulmonary:     Effort: No respiratory distress.     Breath sounds: No stridor. No wheezing or rhonchi.  Chest:     Chest wall: No tenderness.  Abdominal:     General: Abdomen is flat. Bowel sounds are normal.     Palpations: Abdomen is soft. There is no mass.     Tenderness: There is no abdominal tenderness.  Musculoskeletal:     Cervical back: Normal range of motion and neck supple. No rigidity or tenderness.     Left lower leg: No edema.  Skin:    General: Skin  is warm and dry.  Neurological:     Mental Status: He is alert.    Results for orders placed or performed in visit on 09/21/20  Bayer DCA Hb A1c Waived  Result Value Ref Range   HB A1C (BAYER DCA - WAIVED) 9.0 (H) <7.0 %  Vitamin B12  Result Value Ref Range   Vitamin B-12 529 232 - 1,245 pg/mL  Urinalysis, Routine w reflex microscopic  Result Value Ref Range   Specific Gravity, UA 1.015 1.005 - 1.030   pH, UA 5.0 5.0 - 7.5   Color, UA Yellow Yellow   Appearance Ur Clear Clear   Leukocytes,UA Negative Negative   Protein,UA Negative Negative/Trace   Glucose, UA 3+ (A) Negative   Ketones, UA Negative Negative   RBC, UA Negative Negative   Bilirubin, UA Negative Negative   Urobilinogen, Ur 0.2 0.2 - 1.0 mg/dL   Nitrite, UA Negative Negative  TSH  Result Value Ref Range   TSH 1.930 0.450 - 4.500 uIU/mL  PSA  Result Value Ref Range   Prostate Specific Ag, Serum 1.1 0.0 - 4.0 ng/mL   Lipid panel  Result Value Ref Range   Cholesterol, Total 169 100 - 199 mg/dL   Triglycerides 179 (H) 0 - 149 mg/dL   HDL 45 >39 mg/dL   VLDL Cholesterol Cal 31 5 - 40 mg/dL   LDL Chol Calc (NIH) 93 0 - 99 mg/dL   Chol/HDL Ratio 3.8 0.0 - 5.0 ratio  CBC with Differential/Platelet  Result Value Ref Range   WBC 8.0 3.4 - 10.8 x10E3/uL   RBC 4.96 4.14 - 5.80 x10E6/uL   Hemoglobin 14.4 13.0 - 17.7 g/dL   Hematocrit 44.0 37.5 - 51.0 %   MCV 89 79 - 97 fL   MCH 29.0 26.6 - 33.0 pg   MCHC 32.7 31.5 - 35.7 g/dL   RDW 12.9 11.6 - 15.4 %   Platelets 270 150 - 450 x10E3/uL   Neutrophils 68 Not Estab. %   Lymphs 21 Not Estab. %   Monocytes 9 Not Estab. %   Eos 2 Not Estab. %   Basos 0 Not Estab. %   Neutrophils Absolute 5.4 1.4 - 7.0 x10E3/uL   Lymphocytes Absolute 1.7 0.7 - 3.1 x10E3/uL   Monocytes Absolute 0.7 0.1 - 0.9 x10E3/uL   EOS (ABSOLUTE) 0.2 0.0 - 0.4 x10E3/uL   Basophils Absolute 0.0 0.0 - 0.2 x10E3/uL   Immature Granulocytes 0 Not Estab. %   Immature Grans (Abs) 0.0 0.0 - 0.1 x10E3/uL  CMP14+EGFR  Result Value Ref Range   Glucose 155 (H) 65 - 99 mg/dL   BUN 31 (H) 8 - 27 mg/dL   Creatinine, Ser 1.42 (H) 0.76 - 1.27 mg/dL   eGFR 52 (L) >59 mL/min/1.73   BUN/Creatinine Ratio 22 10 - 24   Sodium 138 134 - 144 mmol/L   Potassium 5.1 3.5 - 5.2 mmol/L   Chloride 100 96 - 106 mmol/L   CO2 21 20 - 29 mmol/L   Calcium 9.9 8.6 - 10.2 mg/dL   Total Protein 7.1 6.0 - 8.5 g/dL   Albumin 4.7 3.7 - 4.7 g/dL   Globulin, Total 2.4 1.5 - 4.5 g/dL   Albumin/Globulin Ratio 2.0 1.2 - 2.2   Bilirubin Total 0.3 0.0 - 1.2 mg/dL   Alkaline Phosphatase 74 44 - 121 IU/L   AST 17 0 - 40 IU/L   ALT 21 0 - 44 IU/L        Current Outpatient  Medications:    ASPIRIN EC PO, Take 81 mg by mouth daily., Disp: , Rfl:    atorvastatin (LIPITOR) 20 MG tablet, Take 1 tablet (20 mg total) by mouth daily., Disp: 100 tablet, Rfl: 0   benazepril (LOTENSIN) 40 MG tablet, Take 1 tablet (40 mg total) by  mouth daily., Disp: 100 tablet, Rfl: 0   dapagliflozin propanediol (FARXIGA) 10 MG TABS tablet, Take 1 tablet (10 mg total) by mouth daily before breakfast., Disp: 30 tablet, Rfl: 3   hydrochlorothiazide (MICROZIDE) 12.5 MG capsule, Take 1 capsule (12.5 mg total) by mouth daily., Disp: 90 capsule, Rfl: 1   ibuprofen (ADVIL) 400 MG tablet, Take 400 mg by mouth every 6 (six) hours as needed for moderate pain. Per patient he has been taking to help with pain in his hips and knees, Disp: , Rfl:    insulin detemir (LEVEMIR FLEXTOUCH) 100 UNIT/ML FlexPen, Inject 30 units into the skin at bedtime., Disp: 15 mL, Rfl: 3   Insulin Pen Needle 31G X 8 MM MISC, Use one needle to administer insulin into skin nightly., Disp: 90 each, Rfl: 3   metFORMIN (GLUCOPHAGE) 1000 MG tablet, Take 1 tablet (1,000 mg total) by mouth 2 (two) times daily with a meal., Disp: 180 tablet, Rfl: 0   Multiple Vitamins-Minerals (CENTRUM SILVER 50+MEN) TABS, Take by mouth., Disp: , Rfl:    lidocaine (LIDODERM) 5 %, Place 1 patch onto the skin daily. Remove & Discard patch within 12 hours or as directed by MD (Patient not taking: Reported on 10/06/2020), Disp: 30 patch, Rfl: 1    Assessment & Plan:  DM:  Continue  on metformin 1000 mg  bid 30 units of levemir, is also farxiga 10 mg daily.  HAD AN ALLERGIC REACTION TO OZEMPIC seen ENT ? Enlarged lymph nodes. No GLPS. Last a1c at 9.1 recheck HbA1c,  urine  microalbumin  diabetic diet plan given to pt  adviced regarding hypoglycemia and instructions given to pt today on how to prevent and treat the same if it were to occur. pt acknowledges the plan and voices understanding of the same.  exercise plan given and encouraged.   advice diabetic yearly podiatry, ophthalmology , nutritionist , dental check q 6 months,    2. Hip pain :  xrays shows  consider referral to ortho  pt takes pain meds for such.  xray hip :  1. Moderate osteoarthritis of the bilateral hips. 2. 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     3. Knee pain   check xrays of swollen joints. Will reer to ortho for such , pt has severe pain and severe arthritis in knees pt takes pain meds for such.   4. HLD recheck FLP, check LFT's work on diet, SE of meds explained to pt. low fat and high fiber diet explained to pt.   5. CKD Cr high last time, will recheck.      Problem List Items Addressed This Visit   None    Orders Placed This Encounter  Procedures   Ambulatory referral to Orthopedic Surgery     Meds ordered this encounter  Medications   traMADol (ULTRAM) 50 MG tablet    Sig: Take 1 tablet (50 mg total) by mouth every 8 (eight) hours as needed for up to 5 days.    Dispense:  15 tablet    Refill:  0   methylPREDNISolone (MEDROL DOSEPAK) 4 MG TBPK tablet    Sig: Use as directed  Dispense:  1 each    Refill:  0   insulin detemir (LEVEMIR FLEXTOUCH) 100 UNIT/ML FlexPen    Sig: Inject 35 units into the skin at bedtime.    Dispense:  15 mL    Refill:  3     Follow up plan: No follow-ups on file.

## 2020-10-11 DIAGNOSIS — M25561 Pain in right knee: Secondary | ICD-10-CM | POA: Diagnosis not present

## 2020-11-02 DIAGNOSIS — M25561 Pain in right knee: Secondary | ICD-10-CM | POA: Diagnosis not present

## 2020-11-03 DIAGNOSIS — M1611 Unilateral primary osteoarthritis, right hip: Secondary | ICD-10-CM | POA: Diagnosis not present

## 2020-11-03 DIAGNOSIS — M5416 Radiculopathy, lumbar region: Secondary | ICD-10-CM | POA: Diagnosis not present

## 2020-11-09 ENCOUNTER — Ambulatory Visit (INDEPENDENT_AMBULATORY_CARE_PROVIDER_SITE_OTHER): Payer: Medicare HMO | Admitting: Internal Medicine

## 2020-11-09 ENCOUNTER — Other Ambulatory Visit: Payer: Self-pay

## 2020-11-09 ENCOUNTER — Encounter: Payer: Self-pay | Admitting: Internal Medicine

## 2020-11-09 VITALS — BP 130/77 | HR 79 | Temp 98.4°F | Ht 69.69 in | Wt 241.2 lb

## 2020-11-09 DIAGNOSIS — M25561 Pain in right knee: Secondary | ICD-10-CM | POA: Diagnosis not present

## 2020-11-09 DIAGNOSIS — G8929 Other chronic pain: Secondary | ICD-10-CM

## 2020-11-09 DIAGNOSIS — E119 Type 2 diabetes mellitus without complications: Secondary | ICD-10-CM | POA: Insufficient documentation

## 2020-11-09 DIAGNOSIS — Z79899 Other long term (current) drug therapy: Secondary | ICD-10-CM | POA: Diagnosis not present

## 2020-11-09 DIAGNOSIS — M25559 Pain in unspecified hip: Secondary | ICD-10-CM | POA: Diagnosis not present

## 2020-11-09 DIAGNOSIS — M5136 Other intervertebral disc degeneration, lumbar region: Secondary | ICD-10-CM | POA: Insufficient documentation

## 2020-11-09 DIAGNOSIS — M25562 Pain in left knee: Secondary | ICD-10-CM | POA: Diagnosis not present

## 2020-11-09 DIAGNOSIS — M549 Dorsalgia, unspecified: Secondary | ICD-10-CM | POA: Diagnosis not present

## 2020-11-09 LAB — BAYER DCA HB A1C WAIVED: HB A1C (BAYER DCA - WAIVED): 8.1 % — ABNORMAL HIGH (ref 4.8–5.6)

## 2020-11-09 NOTE — Progress Notes (Signed)
BP 130/77   Pulse 79   Temp 98.4 F (36.9 C) (Oral)   Ht 5' 9.69" (1.77 m)   Wt 241 lb 3.2 oz (109.4 kg)   SpO2 97%   BMI 34.92 kg/m    Subjective:    Patient ID: Matthew [name], male    DOB: 04-07-1945, 75 y.o.   MRN: [id number]  Chief Complaint  Patient presents with   Diabetes   Hypertension   Hyperlipidemia   Chronic Kidney Disease    HPI: Matthew [name] is a 75 y.o. male  Diabetes He presents for his follow-up diabetic visit. He has type 2 diabetes mellitus. Pertinent negatives for hypoglycemia include no headaches. Pertinent negatives for diabetes include no blurred vision, no chest pain, no foot ulcerations, no polydipsia and no visual change.  Hypertension This is a chronic problem. The current episode started more than 1 year ago. The problem has been gradually improving since onset. The problem is controlled. Pertinent negatives include no anxiety, blurred vision, chest pain, headaches, malaise/fatigue, neck pain, peripheral edema or PND.  Hyperlipidemia This is a chronic problem. The current episode started more than 1 year ago. The problem is controlled. Pertinent negatives include no chest pain.   Chief Complaint  Patient presents with   Diabetes   Hypertension   Hyperlipidemia   Chronic Kidney Disease    Relevant past medical, surgical, family and social history reviewed and updated as indicated. Interim medical history since our last visit reviewed. Allergies and medications reviewed and updated.  Review of Systems  Constitutional:  Negative for malaise/fatigue.  Eyes:  Negative for blurred vision.  Cardiovascular:  Negative for chest pain and PND.  Endocrine: Negative for polydipsia.  Musculoskeletal:  Negative for neck pain.  Neurological:  Negative for headaches.   Per HPI unless specifically indicated above     Objective:    BP 130/77   Pulse 79   Temp 98.4 F (36.9 C) (Oral)   Ht 5' 9.69" (1.77 m)   Wt 241 lb 3.2 oz (109.4 kg)    SpO2 97%   BMI 34.92 kg/m   Wt Readings from Last 3 Encounters:  11/09/20 241 lb 3.2 oz (109.4 kg)  10/06/20 239 lb (108.4 kg)  09/21/20 241 lb 3.2 oz (109.4 kg)    Physical Exam  Results for orders placed or performed in visit on 11/09/20  Bayer DCA Hb A1c Waived  Result Value Ref Range   HB A1C (BAYER DCA - WAIVED) 8.1 (H) 4.8 - 5.6 %        Current Outpatient Medications:    ASPIRIN EC PO, Take 81 mg by mouth daily., Disp: , Rfl:    atorvastatin (LIPITOR) 20 MG tablet, Take 1 tablet (20 mg total) by mouth daily., Disp: 100 tablet, Rfl: 0   benazepril (LOTENSIN) 40 MG tablet, Take 1 tablet (40 mg total) by mouth daily., Disp: 100 tablet, Rfl: 0   dapagliflozin propanediol (FARXIGA) 10 MG TABS tablet, Take 1 tablet (10 mg total) by mouth daily before breakfast., Disp: 30 tablet, Rfl: 3   hydrochlorothiazide (MICROZIDE) 12.5 MG capsule, Take 1 capsule (12.5 mg total) by mouth daily., Disp: 90 capsule, Rfl: 1   ibuprofen (ADVIL) 400 MG tablet, Take 400 mg by mouth every 6 (six) hours as needed for moderate pain. Per patient he has been taking to help with pain in his hips and knees, Disp: , Rfl:    insulin detemir (LEVEMIR FLEXTOUCH) 100 UNIT/ML FlexPen, Inject 35 units into  the skin at bedtime., Disp: 15 mL, Rfl: 3   Insulin Pen Needle 31G X 8 MM MISC, Use one needle to administer insulin into skin nightly., Disp: 90 each, Rfl: 3   lidocaine (LIDODERM) 5 %, Place 1 patch onto the skin daily. 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   2. Obesity : gained more weight Lost 2 inches in wasit per pt according to his scales @ home - 236 lbs.  Wt Readings from Last 3 Encounters:  11/09/20 241 lb 3.2 oz (109.4 kg)  10/06/20 239 lb (108.4 kg)  09/21/20 241 lb 3.2 oz (109.4 kg)     3. DM a1c recheck FSBS - 81 now is on levemir 30 units  137 in the am, is on farxiga and metformin 1000 mg bid. A1c - 9.0  check HbA1c,  urine  microalbumin  diabetic diet plan given to pt  adviced regarding hypoglycemia and instructions given to pt today on how to prevent and treat the same if it were to occur. pt acknowledges the plan and voices understanding of the same.  exercise plan given and encouraged.   advice diabetic yearly podiatry, ophthalmology , nutritionist , dental check q 6 months  Lab Results  Component Value Date   HGBA1C 8.1 (H) 11/09/2020    Problem List Items Addressed This Visit       Endocrine   Diabetes mellitus without complication ([place]) - Primary   Relevant Orders   Bayer DCA Hb A1c Waived (Completed)   Vitamin B12     Other   Hip pain   Back pain   Relevant Medications   traMADol (ULTRAM) 50 MG tablet   Chronic pain of both knees   Relevant Medications   traMADol (ULTRAM) 50 MG tablet     Orders Placed This Encounter  Procedures   Bayer DCA Hb A1c Waived   Vitamin B12     No orders of the defined types were placed in this encounter.    Follow up plan: Return in about 6 weeks (around 12/21/2020).

## 2020-11-10 LAB — VITAMIN B12: Vitamin B-12: 463 pg/mL (ref 232–1245)

## 2020-11-17 DIAGNOSIS — E119 Type 2 diabetes mellitus without complications: Secondary | ICD-10-CM | POA: Diagnosis not present

## 2020-11-17 DIAGNOSIS — M25551 Pain in right hip: Secondary | ICD-10-CM | POA: Diagnosis not present

## 2020-11-18 ENCOUNTER — Other Ambulatory Visit: Payer: Self-pay | Admitting: Nurse Practitioner

## 2020-11-18 DIAGNOSIS — E7800 Pure hypercholesterolemia, unspecified: Secondary | ICD-10-CM

## 2020-11-18 DIAGNOSIS — E119 Type 2 diabetes mellitus without complications: Secondary | ICD-10-CM

## 2020-11-18 DIAGNOSIS — I10 Essential (primary) hypertension: Secondary | ICD-10-CM

## 2020-11-19 ENCOUNTER — Telehealth: Payer: Self-pay

## 2020-11-19 NOTE — Chronic Care Management (AMB) (Signed)
    Chronic Care Management Pharmacy Assistant   Name: Matthew [name]  MRN: [id number] DOB: 09-Dec-1945  Reason for Encounter: Disease State Diabetes Mellitus  Recent office visits:  11/09/20-Avanti Neomia Dear, MD (PCP) Seen for general follow up. Labs ordered. Follow up in 6 weeks. 10/06/20-Avanti [name], MD (PCP) Seen for general follow up. Ambulatory referral to orthopedic surgery. 09/21/20-Avanti [name], MD (PCP) Seen for general follow up.  Increase Levemir 25 units daily and then to 30 units gradually over the next 2 weeks. Xray ordered. Referral to community care coordination. 07/21/20-Jolene T. Ned Card, NP Seen for general follow up. Labs ordered.  Levemir increase to 30 units. Start Farxiga 10 MG daily. Follow up in 4 weeks.  Recent consult visits:  None noted  Hospital visits:  None in previous 6 months  Medications: Outpatient Encounter Medications as of 11/19/2020  Medication Sig   ASPIRIN EC PO Take 81 mg by mouth daily.   atorvastatin (LIPITOR) 20 MG tablet Take 1 tablet (20 mg total) by mouth daily.   benazepril (LOTENSIN) 40 MG tablet Take 1 tablet (40 mg total) by mouth daily.   dapagliflozin propanediol (FARXIGA) 10 MG TABS tablet Take 1 tablet (10 mg total) by mouth daily before breakfast.   hydrochlorothiazide (MICROZIDE) 12.5 MG capsule Take 1 capsule (12.5 mg total) by mouth daily.   ibuprofen (ADVIL) 400 MG tablet Take 400 mg by mouth every 6 (six) hours as needed for moderate pain. Per patient he has been taking to help with pain in his hips and knees   insulin detemir (LEVEMIR FLEXTOUCH) 100 UNIT/ML FlexPen Inject 35 units into the skin at bedtime.   Insulin Pen Needle 31G X 8 MM MISC Use one needle to administer insulin into skin nightly.   lidocaine (LIDODERM) 5 % Place 1 patch onto the skin daily. Remove & Discard patch within 12 hours or as directed by MD   metFORMIN (GLUCOPHAGE) 1000 MG tablet Take 1 tablet (1,000 mg total) by mouth 2 (two) times daily with a meal.    Multiple Vitamins-Minerals (CENTRUM SILVER 50+MEN) TABS Take by mouth.   traMADol (ULTRAM) 50 MG tablet Take by mouth.   No facility-administered encounter medications on file as of 11/19/2020.   Unsuccessful out reach to complete assessment call. I called 3x and left 3 voicemail's to return phone the phone call.  Adherence Review: Is the patient currently on a STATIN medication? Yes Is the patient currently on ACE/ARB medication? Yes Does the patient have >5 day gap between last estimated fill dates? No   Care Gaps: None noted  Star Rating Drugs: Benazepril 40 mg Last filled:08/18/20 100 DS Atorvastatin 20 mg Last filled:08/18/20 100 DS Farxiga 10 mg Last filled:09/20/20 30 DS  [name], [place]

## 2020-12-03 ENCOUNTER — Telehealth: Payer: Medicare HMO | Admitting: General Practice

## 2020-12-03 ENCOUNTER — Ambulatory Visit (INDEPENDENT_AMBULATORY_CARE_PROVIDER_SITE_OTHER): Payer: Medicare HMO

## 2020-12-03 DIAGNOSIS — M1990 Unspecified osteoarthritis, unspecified site: Secondary | ICD-10-CM

## 2020-12-03 DIAGNOSIS — G8929 Other chronic pain: Secondary | ICD-10-CM

## 2020-12-03 DIAGNOSIS — E1159 Type 2 diabetes mellitus with other circulatory complications: Secondary | ICD-10-CM

## 2020-12-03 DIAGNOSIS — E1165 Type 2 diabetes mellitus with hyperglycemia: Secondary | ICD-10-CM

## 2020-12-03 DIAGNOSIS — Z794 Long term (current) use of insulin: Secondary | ICD-10-CM

## 2020-12-03 DIAGNOSIS — I152 Hypertension secondary to endocrine disorders: Secondary | ICD-10-CM

## 2020-12-03 DIAGNOSIS — I10 Essential (primary) hypertension: Secondary | ICD-10-CM

## 2020-12-03 DIAGNOSIS — E119 Type 2 diabetes mellitus without complications: Secondary | ICD-10-CM

## 2020-12-03 DIAGNOSIS — E1169 Type 2 diabetes mellitus with other specified complication: Secondary | ICD-10-CM

## 2020-12-03 DIAGNOSIS — M25559 Pain in unspecified hip: Secondary | ICD-10-CM

## 2020-12-03 DIAGNOSIS — E785 Hyperlipidemia, unspecified: Secondary | ICD-10-CM

## 2020-12-03 NOTE — Patient Instructions (Signed)
Visit Information  PATIENT GOALS:  Goals Addressed             This Visit's Progress    RNCM: Cope with Chronic Pain       Timeframe:  Long-Range Goal Priority:  High Start Date:      10-01-2020                       Expected End Date:     10-01-2021                  Follow Up Date 01/28/2021    - learn how to meditate - learn relaxation techniques - practice acceptance of chronic pain - practice relaxation or meditation daily - spend time with positive people - tell myself I can (not I can't) - think of new ways to do favorite things - use distraction techniques - use relaxation during pain -follow recommendations of the provider -take medications as prescribed     Why is this important?   Stress makes chronic pain feel worse.  Feelings like depression, anxiety, stress and anger can make your body more sensitive to pain.  Learning ways to cope with stress or depression may help you find some relief from the pain.     Notes: 10-01-2020: The patient is experiencing pain in his hips and knees. Xrays reveal he has arthritis. Will follow up with the pcp on 10-06-2020 for evaluation and recommendations. The patient is hopeful that he will get some cortisone shots. 12-03-2020: The patient states he is having surgery on his right knee on 12-23-2020. The patient states that his hip is actually better. He had an injection 2 weeks ago on his hip is much better. States he is not having pain in his hip at this time. He feels the surgery will help him his knee a lot. He is using his cane and denies fall. Will continue to monitor.      RNCM: Manage My Emotions       Timeframe:  Short-Term Goal Priority:  Medium Start Date:         10-01-2020                    Expected End Date:       02-15-2021                Follow Up Date 12/03/2020    - call and visit an old friend - laugh; watch a funny movie or comedian - learn and use visualization or guided imagery - perform a random act of kindness -  practice relaxation or meditation daily - talk about feelings with a friend, family or spiritual advisor - practice positive thinking and self-talk    Why is this important?   When you are stressed, down or upset, your body reacts too.  For example, your blood pressure may get higher; you may have a headache or stomachache.  When your emotions get the best of you, your body's ability to fight off cold and flu gets weak.  These steps will help you manage your emotions.     Notes: 10-01-2020: The patient is optimistic and hopeful the pcp can help with the pain and he would feel a lot better. Having a little bit of a hard time due to the chronic pain issues he is having right now. 12-03-2020: Patient is feeling much better and feels the surgery will really improve his health and well being. He  says he can see big improvements since having the injection in his right hip. He denies any acute distress at this time. Will continue to monitor.      RNCM: Monitor and Manage My Blood Sugar-Diabetes Type 2       Timeframe:  Long-Range Goal Priority:  High Start Date:       10-01-2020                      Expected End Date:    10-01-2021                   Follow Up Date 01-28-2021    - check blood sugar at prescribed times - check blood sugar before and after exercise - check blood sugar if I feel it is too high or too low - enter blood sugar readings and medication or insulin into daily log - take the blood sugar log to all doctor visits - take the blood sugar meter to all doctor visits    Why is this important?   Checking your blood sugar at home helps to keep it from getting very high or very low.  Writing the results in a diary or log helps the doctor know how to care for you.  Your blood sugar log should have the time, date and the results.  Also, write down the amount of insulin or other medicine that you take.  Other information, like what you ate, exercise done and how you were feeling, will also  be helpful.   Lab Results  Component Value Date   HGBA1C 8.1 (H) 11/09/2020      Notes: 10-01-2020: The patient is checking his blood sugars regularly and he has made dietary changes. His wife is helping him with food options. He states compliance with his medications. Blood sugar readings are 130 to 170 lately with one high reading at 297 one day last week and it was one hour after eating. Review of the goal of fasting <130 and 2 hours after eating <180. Will continue to monitor. 12-03-2020: The patient still having elevated A1C but had received an injection recently and it caused his blood sugars to go up to 200's for several days. The patient states blood sugar this am was 115. He states his wife is the "boss" and she is helping him with his health and well being. She monitors his dietary intake.      RNCM: Set My Target A1C-Diabetes Type 2       Timeframe:  Long-Range Goal Priority:  High Start Date:      10-01-2020                       Expected End Date:  10-01-2021                     Follow Up Date 01-28-2021    - set target A1C    Why is this important?   Your target A1C is decided together by you and your doctor.  It is based on several things like your age and other health issues.  Lab Results  Component Value Date   HGBA1C 8.1 (H) 11/09/2020      Notes: 10-01-2020: Reviewed with the patient the goal of hemoglobin A1C of 7.0 or less. Current A1C is 9.0 on 09-21-2020. 12-03-2020: Review of A1C, knows goal to work toward. Had steroid injections and this caused blood sugars to be elevated.  Will continue to monitor.         The patient verbalized understanding of instructions, educational materials, and care plan provided today and declined offer to receive copy of patient instructions, educational materials, and care plan.   Telephone follow up appointment with care management team member scheduled for: 01-28-2021 at 145 pm  Noreene Larsson RN, MSN, [facility] Mobile: [phone]

## 2020-12-03 NOTE — Chronic Care Management (AMB) (Signed)
Chronic Care Management   CCM RN Visit Note  12/03/2020 Name: Matthew [name] MRN: [id number] DOB: 1945-04-25  Subjective: Matthew [name] is a 75 y.o. year old male who is a primary care patient of [name], Avanti, MD. The care management team was consulted for assistance with disease management and care coordination needs.    Engaged with patient by telephone for follow up visit in response to provider referral for case management and/or care coordination services.   Consent to Services:  The patient was given information about Chronic Care Management services, agreed to services, and gave verbal consent prior to initiation of services.  Please see initial visit note for detailed documentation.   Patient agreed to services and verbal consent obtained.   Assessment: Review of patient past medical history, allergies, medications, health status, including review of consultants reports, laboratory and other test data, was performed as part of comprehensive evaluation and provision of chronic care management services.   SDOH (Social Determinants of Health) assessments and interventions performed:    CCM Care Plan  Allergies  Allergen Reactions   Ozempic (0.25 Or 0.5 Mg-Dose) [Semaglutide(0.25 Or 0.87m-Dos)] Swelling    Outpatient Encounter Medications as of 12/03/2020  Medication Sig   ASPIRIN EC PO Take 81 mg by mouth daily.   atorvastatin (LIPITOR) 20 MG tablet Take 1 tablet (20 mg total) by mouth daily.   benazepril (LOTENSIN) 40 MG tablet Take 1 tablet (40 mg total) by mouth daily.   dapagliflozin propanediol (FARXIGA) 10 MG TABS tablet Take 1 tablet (10 mg total) by mouth daily before breakfast.   hydrochlorothiazide (MICROZIDE) 12.5 MG capsule Take 1 capsule (12.5 mg total) by mouth daily.   ibuprofen (ADVIL) 400 MG tablet Take 400 mg by mouth every 6 (six) hours as needed for moderate pain. Per patient he has been taking to help with pain in his hips and knees   insulin detemir  (LEVEMIR FLEXTOUCH) 100 UNIT/ML FlexPen Inject 35 units into the skin at bedtime.   Insulin Pen Needle 31G X 8 MM MISC Use one needle to administer insulin into skin nightly.   lidocaine (LIDODERM) 5 % Place 1 patch onto the skin daily. Remove & Discard patch within 12 hours or as directed by MD   metFORMIN (GLUCOPHAGE) 1000 MG tablet Take 1 tablet (1,000 mg total) by mouth 2 (two) times daily with a meal.   Multiple Vitamins-Minerals (CENTRUM SILVER 50+MEN) TABS Take by mouth.   traMADol (ULTRAM) 50 MG tablet Take by mouth.   No facility-administered encounter medications on file as of 12/03/2020.    Patient Active Problem List   Diagnosis Date Noted   Diabetes mellitus without complication ([place] 006/26/9485  Hip pain 11/09/2020   Back pain 11/09/2020   Chronic pain of both knees 11/09/2020   Stage 3a chronic kidney disease ([facility] 10/06/2020   Arthritis 10/06/2020   Morbid obesity ([facility] 07/17/2020   CKD stage 3 due to type 2 diabetes mellitus ([place] 03/13/2020   BPH (benign prostatic hyperplasia) 12/30/2014   Type 2 diabetes mellitus with hyperglycemia ([facility]    Hyperlipidemia associated with type 2 diabetes mellitus ([facility]    Hypertension associated with diabetes ([facility]     Conditions to be addressed/monitored:HTN, HLD, DMII, and Chronic pain in hip and right knee  Care Plan : RNCM: Management of HTN, HLD, DM2, and Chronic pain in bilateral knees and hips  Updates made by TVanita Ingles RN since 12/03/2020 12:00 AM     Problem: RNCM: Management  of HTN, HLD, DM2, and Chronic pain in bilateral knees and hips   Priority: High     Long-Range Goal: RNCM: Management of HTN, HLD, DM2, and Chronic pain in bilateral knees and hips   Start Date: 10/01/2020  Expected End Date: 10/01/2021  This Visit's Progress: On track  Recent Progress: On track  Priority: High  Note:   Current Barriers:  Knowledge Deficits related to plan of care for management of HTN, HLD, DMII, and Chronic pain   Care  Coordination needs related to Level of care concerns in a patient with HTN, HLD, DMII, and Chronic pain  Chronic Disease Management support and education needs related to HTN, HLD, DMII, and Chronic pain   RNCM Clinical Goal(s):  Patient will verbalize understanding of plan for management of HTN, HLD, DMII, and Chronic pain  work with RN Case Manager to address needs related to HTN, HLD, DMII, and chronic pain  and Level of care concerns take all medications exactly as prescribed and will call provider for medication related questions attend all scheduled medical appointments: 12-23-2020 at 240 pm- likely will need to reschedule due to surgery planned meet with RN Care Manager to address chronic conditions, give educational material and ongoing support for effective management of multiple chronic condtions.  demonstrate a decrease in HTN, HLD, DMII, and Chronic pain  exacerbations as evidence of chronic conditions under control and improved pain levels  demonstrate improved adherence to prescribed treatment plan for HTN, HLD, DMII, and Chronic pain as evidenced by daily monitoring and recording of CBG  adherence to ADA/ carb modified diet adherence to prescribed medication regimen contacting provider for new or worsened symptoms or questions related to changes in condition or unresolved pain and discomfort  demonstrate improved health management independence of chronic conditions by working with the pcp and CCM team to optimize health and well being verbalize basic understanding of HTN, HLD, DMII, and chronic pain disease process and self health management plan to effectively manage chronic conditions independently demonstrate understanding of rationale for each prescribed medication for chronic conditions of HTN, HLD, DM, and chronic pain  through collaboration with RN Care manager, provider, and care team.   Interventions: 1:1 collaboration with primary care provider regarding development and  update of comprehensive plan of care as evidenced by provider attestation and co-signature Inter-disciplinary care team collaboration (see longitudinal plan of care) Evaluation of current treatment plan related to  self management and patient's adherence to plan as established by provider;   Diabetes:  (Status: Goal on track: YES.) Lab Results  Component Value Date   HGBA1C 8.1 (H) 11/09/2020  Assessed patient's understanding of A1c goal: <7% Provided education to patient about basic DM disease process; Reviewed medications with patient and discussed importance of medication adherence. The patient states compliance with medications ;        Reviewed prescribed diet with patient Heart healthy/ADA diet, states his wife is helping him with dietary restrictions and is staying on him when he does not eat healthy; Counseled on importance of regular laboratory monitoring as prescribed;        Discussed plans with patient for ongoing care management follow up and provided patient with direct contact information for care management team;      Provided patient with written educational materials related to hypo and hyperglycemia and importance of correct treatment. 12-03-2020: The patient denies any lows. Did have some readings in 200's after injection in his hip a couple weeks ago. Education and support given.  Reviewed scheduled/upcoming provider appointments including: 12-23-2020 at 240 pm, wife to call the office to reschedule as he is having surgery on this day.  Advised patient, providing education and rationale, to check cbg BID and record . The patient states that his blood sugars are usually 130-170. He had one recently an hour after eating that was 297. The patient feels he is doing better with his diabetes management. Review of fasting bloods sugars of <130 and post prandial of <180. 12-03-2020: The patient states his blood sugar this am was 115 and it is much better and in range now. States it  was in the 200's when he got the injection in his hip.      call provider for findings outside established parameters;       Review of patient status, including review of consultants reports, relevant laboratory and other test results, and medications completed;       Screening for signs and symptoms of depression related to chronic disease state;        Assessed social determinant of health barriers;         Hyperlipidemia:  (Status: Goal on track: YES.) Lab Results  Component Value Date   CHOL 169 09/21/2020   HDL 45 09/21/2020   LDLCALC 93 09/21/2020   TRIG 179 (H) 09/21/2020   CHOLHDL 3.8 09/21/2020     Medication review performed; medication list updated in electronic medical record.  Provider established cholesterol goals reviewed; Counseled on importance of regular laboratory monitoring as prescribed; Provided HLD educational materials; Reviewed role and benefits of statin for ASCVD risk reduction; Discussed strategies to manage statin-induced myalgias; Reviewed importance of limiting foods high in cholesterol. 12-03-2020: States that his wife is monitoring his dietary intake. The patient states that she is the "boss' and she helps him with his health and well being.   Hypertension: (Status: Goal on track: YES.) Last practice recorded BP readings:  BP Readings from Last 3 Encounters:  11/09/20 130/77  10/06/20 123/77  09/21/20 124/79  Most recent eGFR/CrCl:  Lab Results  Component Value Date   EGFR 52 (L) 09/21/2020    No components found for: CRCL  Evaluation of current treatment plan related to hypertension self management and patient's adherence to plan as established by provider. 12-03-2020: States that his blood pressures are good. Denies any episodes of elevated blood pressures;   Provided education to patient re: stroke prevention, s/s of heart attack and stroke; Reviewed prescribed diet of heart healthy/ADA diet. 12-03-2020: Compliant with heart healthy/ADA  diet Reviewed medications with patient and discussed importance of compliance. 12-03-2020: Compliant with medications, denies any new concerns;  Discussed plans with patient for ongoing care management follow up and provided patient with direct contact information for care management team; Advised patient, providing education and rationale, to monitor blood pressure daily and record, calling PCP for findings outside established parameters;  Reviewed scheduled/upcoming provider appointments including. 12-03-2020: Will call the office and reschedule his upcoming appointment with pcp as this is the day he will have surgery on his knee.   Provided education on prescribed diet review of heart healthy/ADA diet, the patient is compliant with dietary restrictions at this time;  Discussed complications of poorly controlled blood pressure such as heart disease, stroke, circulatory complications, vision complications, kidney impairment, sexual dysfunction;   Pain:  (Status: Goal on track: NO.) Pain assessment performed. 12-03-2020: The patient states that since getting the injection in his hip a couple of weeks ago he is not experiencing any  pain in his hip and he can actually lay down and sleep. The patient is still experiencing a lot of pain in his right knee. He is scheduled for surgery on 12-23-2020 for repair. He is hopeful that this is going to help his pain considerably. The patient is using a cane when ambulating. Denies falls. Review of safety.  Medications reviewed. 12-03-2020: The shot to his right hip has been effective in eliminating the pain in his hip Reviewed provider established plan for pain management. 12-03-2020: Scheduled for surgery on 12-23-2020 for surgical repair of right knee.  Discussed importance of adherence to all scheduled medical appointments, the patient states he is taking ibuprofen for pain relief. The patient states the lidocaine patches have not been effective for pain control. The  patient states he can hardly walk. 12-03-2020: Has a follow up appointment on 12-23-2020 with the pcp but will need to change. Surgery scheduled for right knee surgery on 12-23-2020. The patient is hopeful that this will help his pain in his right knee.  Counseled on the importance of reporting any/all new or changed pain symptoms or management strategies to pain management provider; Advised patient to report to care team affect of pain on daily activities; Discussed use of relaxation techniques and/or diversional activities to assist with pain reduction (distraction, imagery, relaxation, massage, acupressure, TENS, heat, and cold application; Reviewed with patient prescribed pharmacological and nonpharmacological pain relief strategies; Advised patient to discuss other pain treatment options with provider. The patient is going to ask the pcp about cortisone shots at his appointment on 10-06-2020. He states he can hardly walk. Review of safety and fall prevention;  Patient Goals/Self-Care Activities: Patient will self administer medications as prescribed Patient will attend all scheduled provider appointments Patient will call pharmacy for medication refills Patient will attend church or other social activities Patient will continue to perform ADL's independently Patient will continue to perform IADL's independently Patient will call provider office for new concerns or questions Patient will work with BSW to address care coordination needs and will continue to work with the clinical team to address health care and disease management related needs.         Plan:Telephone follow up appointment with care management team member scheduled for:  01-28-2021 at 145 pm  Noreene Larsson RN, MSN, [facility] Mobile: [phone]

## 2020-12-13 ENCOUNTER — Other Ambulatory Visit: Payer: Self-pay

## 2020-12-13 ENCOUNTER — Encounter
Admission: RE | Admit: 2020-12-13 | Discharge: 2020-12-13 | Disposition: A | Discharge status: Home / Self Care | Payer: Medicare HMO | Source: Ambulatory Visit | Attending: Orthopedic Surgery | Admitting: Orthopedic Surgery

## 2020-12-13 HISTORY — DX: Type 2 diabetes mellitus without complications: E11.9

## 2020-12-13 HISTORY — DX: Unspecified osteoarthritis, unspecified site: M19.90

## 2020-12-13 NOTE — Patient Instructions (Signed)
Your procedure is scheduled on:12-23-20 Thursday Report to the Registration Desk on the 1st floor of the [facility].Then proceed to the 2nd floor Surgery Desk in the [facility] To find out your arrival time, please call [phone] between 1PM - 3PM on:12-22-20 Wednesday  REMEMBER: Instructions that are not followed completely may result in serious medical risk, up to and including death; or upon the discretion of your surgeon and anesthesiologist your surgery may need to be rescheduled.  Do not eat food after midnight the night before surgery.  No gum chewing, lozengers or hard candies.  You may however, drink Water up to 2 hours before you are scheduled to arrive for your surgery. Do not drink anything within 2 hours of your scheduled arrival time.  Type 1 and Type 2 diabetics should only drink water  TAKE THESE MEDICATIONS THE MORNING OF SURGERY WITH A SIP OF WATER: -atorvastatin (LIPITOR) 20 MG tablet  Take HALF of your insulin detemir (LEVEMIR FLEXTOUCH) 100 UNIT/ML FlexPen the night before your surgery and NO insulin the morning of surgery  Stop your 81 mg Aspirin 7 days prior to surgery-Last dose on 12-15-20   Stop your metFORMIN (GLUCOPHAGE) 1000 MG tablet 2 days prior to surgery-Last dose on 12-20-20 Monday  One week prior to surgery: Stop Anti-inflammatories (NSAIDS) such as Advil, Aleve, Ibuprofen, Motrin, Naproxen, Naprosyn and Aspirin based products such as Excedrin, Goodys Powder, BC Powder.You may however, take Tylenol if needed for pain up until the day of surgery.  Stop ANY OVER THE COUNTER supplements/vitamins 7 days prior to surgery (Multiple Vitamins-Minerals (CENTRUM SILVER 50+MEN) TABS and vitamin B-12 (CYANOCOBALAMIN) 500 MCG tablet)  No Alcohol for 24 hours before or after surgery.  No Smoking including e-cigarettes for 24 hours prior to surgery.  No chewable tobacco products for at least 6 hours prior to surgery.  No nicotine patches on the day of  surgery.  Do not use any "recreational" drugs for at least a week prior to your surgery.  Please be advised that the combination of cocaine and anesthesia may have negative outcomes, up to and including death. If you test positive for cocaine, your surgery will be cancelled.  On the morning of surgery brush your teeth with toothpaste and water, you may rinse your mouth with mouthwash if you wish. Do not swallow any toothpaste or mouthwash.  Use CHG Soap as directed on instruction sheet.  Do not wear jewelry, make-up, hairpins, clips or nail polish.  Do not wear lotions, powders, or perfumes.   Do not shave body from the neck down 48 hours prior to surgery just in case you cut yourself which could leave a site for infection.  Also, freshly shaved skin may become irritated if using the CHG soap.  Contact lenses, hearing aids and dentures may not be worn into surgery.  Do not bring valuables to the hospital. [facility] is not responsible for any missing/lost belongings or valuables.   Notify your doctor if there is any change in your medical condition (cold, fever, infection).  Wear comfortable clothing (specific to your surgery type) to the hospital.  After surgery, you can help prevent lung complications by doing breathing exercises.  Take deep breaths and cough every 1-2 hours. Your doctor may order a device called an Incentive Spirometer to help you take deep breaths. When coughing or sneezing, hold a pillow firmly against your incision with both hands. This is called "splinting." Doing this helps protect your incision. It also decreases belly  discomfort.  If you are being admitted to the hospital overnight, leave your suitcase in the car. After surgery it may be brought to your room.  If you are being discharged the day of surgery, you will not be allowed to drive home. You will need a responsible adult (18 years or older) to drive you home and stay with you that night.   If  you are taking public transportation, you will need to have a responsible adult (18 years or older) with you. Please confirm with your physician that it is acceptable to use public transportation.   Please call the [facility] at [phone] if you have any questions about these instructions.  Surgery Visitation Policy:  Patients undergoing a surgery or procedure may have one family member or support person with them as long as that person is not COVID-19 positive or experiencing its symptoms.  That person may remain in the waiting area during the procedure and may rotate out with other people.  Inpatient Visitation:    Visiting hours are 7 a.m. to 8 p.m. Up to two visitors ages 16+ are allowed at one time in a patient room. The visitors may rotate out with other people during the day. Visitors must check out when they leave, or other visitors will not be allowed. One designated support person may remain overnight. The visitor must pass COVID-19 screenings, use hand sanitizer when entering and exiting the patient's room and wear a mask at all times, including in the patient's room. Patients must also wear a mask when staff or their visitor are in the room. Masking is required regardless of vaccination status.

## 2020-12-15 ENCOUNTER — Encounter
Admission: RE | Admit: 2020-12-15 | Discharge: 2020-12-15 | Disposition: A | Discharge status: Home / Self Care | Payer: Medicare HMO | Source: Ambulatory Visit | Attending: Orthopedic Surgery | Admitting: Orthopedic Surgery

## 2020-12-15 ENCOUNTER — Other Ambulatory Visit: Payer: Self-pay

## 2020-12-15 DIAGNOSIS — I10 Essential (primary) hypertension: Secondary | ICD-10-CM | POA: Diagnosis not present

## 2020-12-15 DIAGNOSIS — E119 Type 2 diabetes mellitus without complications: Secondary | ICD-10-CM | POA: Insufficient documentation

## 2020-12-17 ENCOUNTER — Other Ambulatory Visit: Payer: Medicare HMO

## 2020-12-22 MED ORDER — CHLORHEXIDINE GLUCONATE 0.12 % MT SOLN: 15.0000 mL | Freq: Once | OROMUCOSAL | Status: AC

## 2020-12-22 MED ORDER — ORAL CARE MOUTH RINSE: 15.0000 mL | Freq: Once | OROMUCOSAL | Status: AC

## 2020-12-22 MED ORDER — SODIUM CHLORIDE 0.9 % IV SOLN: INTRAVENOUS | Status: DC

## 2020-12-23 ENCOUNTER — Other Ambulatory Visit: Payer: Self-pay | Admitting: Orthopedic Surgery

## 2020-12-23 ENCOUNTER — Encounter
Admission: RE | Disposition: A | Discharge status: Home / Self Care | Payer: Self-pay | Source: Home / Self Care | Attending: Orthopedic Surgery

## 2020-12-23 ENCOUNTER — Ambulatory Visit
Admission: RE | Admit: 2020-12-23 | Discharge: 2020-12-23 | Disposition: A | Discharge status: Home / Self Care | Payer: Medicare HMO | Attending: Orthopedic Surgery | Admitting: Orthopedic Surgery

## 2020-12-23 ENCOUNTER — Ambulatory Visit: Payer: Medicare HMO | Admitting: Urgent Care

## 2020-12-23 ENCOUNTER — Other Ambulatory Visit: Payer: Self-pay

## 2020-12-23 ENCOUNTER — Ambulatory Visit: Payer: Medicare HMO | Admitting: Internal Medicine

## 2020-12-23 ENCOUNTER — Encounter: Payer: Self-pay | Admitting: Orthopedic Surgery

## 2020-12-23 DIAGNOSIS — Z794 Long term (current) use of insulin: Secondary | ICD-10-CM | POA: Diagnosis not present

## 2020-12-23 DIAGNOSIS — Z6832 Body mass index (BMI) 32.0-32.9, adult: Secondary | ICD-10-CM | POA: Diagnosis not present

## 2020-12-23 DIAGNOSIS — M1711 Unilateral primary osteoarthritis, right knee: Secondary | ICD-10-CM | POA: Insufficient documentation

## 2020-12-23 DIAGNOSIS — S83281A Other tear of lateral meniscus, current injury, right knee, initial encounter: Secondary | ICD-10-CM | POA: Diagnosis not present

## 2020-12-23 DIAGNOSIS — Z8249 Family history of ischemic heart disease and other diseases of the circulatory system: Secondary | ICD-10-CM | POA: Insufficient documentation

## 2020-12-23 DIAGNOSIS — E669 Obesity, unspecified: Secondary | ICD-10-CM | POA: Diagnosis not present

## 2020-12-23 DIAGNOSIS — M6751 Plica syndrome, right knee: Secondary | ICD-10-CM | POA: Insufficient documentation

## 2020-12-23 DIAGNOSIS — E119 Type 2 diabetes mellitus without complications: Secondary | ICD-10-CM | POA: Insufficient documentation

## 2020-12-23 DIAGNOSIS — Z833 Family history of diabetes mellitus: Secondary | ICD-10-CM | POA: Insufficient documentation

## 2020-12-23 DIAGNOSIS — Z79899 Other long term (current) drug therapy: Secondary | ICD-10-CM | POA: Diagnosis not present

## 2020-12-23 DIAGNOSIS — S83206A Unspecified tear of unspecified meniscus, current injury, right knee, initial encounter: Secondary | ICD-10-CM | POA: Diagnosis not present

## 2020-12-23 DIAGNOSIS — X58XXXA Exposure to other specified factors, initial encounter: Secondary | ICD-10-CM | POA: Diagnosis not present

## 2020-12-23 DIAGNOSIS — Z7982 Long term (current) use of aspirin: Secondary | ICD-10-CM | POA: Insufficient documentation

## 2020-12-23 DIAGNOSIS — S83241A Other tear of medial meniscus, current injury, right knee, initial encounter: Secondary | ICD-10-CM | POA: Diagnosis present

## 2020-12-23 DIAGNOSIS — S83231A Complex tear of medial meniscus, current injury, right knee, initial encounter: Secondary | ICD-10-CM | POA: Diagnosis not present

## 2020-12-23 DIAGNOSIS — S83271A Complex tear of lateral meniscus, current injury, right knee, initial encounter: Secondary | ICD-10-CM | POA: Diagnosis not present

## 2020-12-23 DIAGNOSIS — Z7984 Long term (current) use of oral hypoglycemic drugs: Secondary | ICD-10-CM | POA: Diagnosis not present

## 2020-12-23 HISTORY — PX: KNEE ARTHROSCOPY: SHX127

## 2020-12-23 LAB — GLUCOSE, CAPILLARY
Glucose-Capillary: 118 mg/dL — ABNORMAL HIGH (ref 70–99)
Glucose-Capillary: 151 mg/dL — ABNORMAL HIGH (ref 70–99)

## 2020-12-23 SURGERY — ARTHROSCOPY, KNEE
Anesthesia: General | Site: Knee | Laterality: Right

## 2020-12-23 MED ORDER — PROPOFOL 10 MG/ML IV BOLUS
INTRAVENOUS | Status: AC
  Filled 2020-12-23: qty 20

## 2020-12-23 MED ORDER — BUPIVACAINE-EPINEPHRINE 0.25% -1:200000 IJ SOLN
INTRAMUSCULAR | Status: DC | PRN
  Administered 2020-12-23: 20 mL

## 2020-12-23 MED ORDER — HYDROCODONE-ACETAMINOPHEN 5-325 MG PO TABS: 1.0000 | ORAL_TABLET | ORAL | 0 refills | Status: DC | PRN

## 2020-12-23 MED ORDER — OXYCODONE HCL 5 MG/5ML PO SOLN: 5.0000 mg | Freq: Once | ORAL | Status: DC | PRN

## 2020-12-23 MED ORDER — LIDOCAINE HCL (CARDIAC) PF 100 MG/5ML IV SOSY
PREFILLED_SYRINGE | INTRAVENOUS | Status: DC | PRN
  Administered 2020-12-23: 100 mg via INTRAVENOUS

## 2020-12-23 MED ORDER — ONDANSETRON HCL 4 MG/2ML IJ SOLN
INTRAMUSCULAR | Status: AC
  Filled 2020-12-23: qty 2

## 2020-12-23 MED ORDER — CEFAZOLIN SODIUM-DEXTROSE 2-4 GM/100ML-% IV SOLN
INTRAVENOUS | Status: AC
  Filled 2020-12-23: qty 100

## 2020-12-23 MED ORDER — FENTANYL CITRATE (PF) 100 MCG/2ML IJ SOLN: 25.0000 ug | INTRAMUSCULAR | Status: DC | PRN

## 2020-12-23 MED ORDER — FENTANYL CITRATE (PF) 100 MCG/2ML IJ SOLN
INTRAMUSCULAR | Status: AC
  Filled 2020-12-23: qty 2

## 2020-12-23 MED ORDER — LIDOCAINE HCL (PF) 2 % IJ SOLN
INTRAMUSCULAR | Status: AC
  Filled 2020-12-23: qty 5

## 2020-12-23 MED ORDER — ONDANSETRON HCL 4 MG/2ML IJ SOLN
INTRAMUSCULAR | Status: DC | PRN
  Administered 2020-12-23: 4 mg via INTRAVENOUS

## 2020-12-23 MED ORDER — CHLORHEXIDINE GLUCONATE 0.12 % MT SOLN
OROMUCOSAL | Status: AC
  Administered 2020-12-23: 15 mL via OROMUCOSAL
  Filled 2020-12-23: qty 15

## 2020-12-23 MED ORDER — CEFAZOLIN SODIUM-DEXTROSE 2-4 GM/100ML-% IV SOLN
2.0000 g | INTRAVENOUS | Status: AC
  Administered 2020-12-23: 2 g via INTRAVENOUS

## 2020-12-23 MED ORDER — ACETAMINOPHEN 10 MG/ML IV SOLN
INTRAVENOUS | Status: AC
  Filled 2020-12-23: qty 100

## 2020-12-23 MED ORDER — CHLORHEXIDINE GLUCONATE CLOTH 2 % EX PADS
6.0000 | MEDICATED_PAD | Freq: Once | CUTANEOUS | Status: AC
  Administered 2020-12-23: 6 via TOPICAL

## 2020-12-23 MED ORDER — RINGERS IRRIGATION IR SOLN
Status: DC | PRN
  Administered 2020-12-23: 12000 mL

## 2020-12-23 MED ORDER — LIDOCAINE HCL (PF) 1 % IJ SOLN
INTRAMUSCULAR | Status: AC
  Filled 2020-12-23: qty 30

## 2020-12-23 MED ORDER — DROPERIDOL 2.5 MG/ML IJ SOLN
0.6250 mg | Freq: Once | INTRAMUSCULAR | Status: DC | PRN
  Filled 2020-12-23: qty 2

## 2020-12-23 MED ORDER — DEXMEDETOMIDINE (PRECEDEX) IN NS 20 MCG/5ML (4 MCG/ML) IV SYRINGE
PREFILLED_SYRINGE | INTRAVENOUS | Status: AC
  Filled 2020-12-23: qty 5

## 2020-12-23 MED ORDER — OXYCODONE HCL 5 MG PO TABS
5.0000 mg | ORAL_TABLET | Freq: Once | ORAL | Status: DC | PRN
Start: 2020-12-23 — End: 2020-12-23

## 2020-12-23 MED ORDER — ACETAMINOPHEN 500 MG PO TABS: 1000.0000 mg | ORAL_TABLET | ORAL | Status: AC

## 2020-12-23 MED ORDER — BUPIVACAINE-EPINEPHRINE (PF) 0.25% -1:200000 IJ SOLN
INTRAMUSCULAR | Status: AC
  Filled 2020-12-23: qty 30

## 2020-12-23 MED ORDER — ACETAMINOPHEN 10 MG/ML IV SOLN: 1000.0000 mg | Freq: Once | INTRAVENOUS | Status: DC | PRN

## 2020-12-23 MED ORDER — ONDANSETRON HCL 4 MG PO TABS: 4.0000 mg | ORAL_TABLET | Freq: Three times a day (TID) | ORAL | 0 refills | Status: DC | PRN

## 2020-12-23 MED ORDER — LIDOCAINE HCL 1 % IJ SOLN
INTRAMUSCULAR | Status: DC | PRN
  Administered 2020-12-23: 5 mL

## 2020-12-23 MED ORDER — DEXMEDETOMIDINE (PRECEDEX) IN NS 20 MCG/5ML (4 MCG/ML) IV SYRINGE
PREFILLED_SYRINGE | INTRAVENOUS | Status: DC | PRN
  Administered 2020-12-23 (×5): 4 ug via INTRAVENOUS

## 2020-12-23 MED ORDER — ASPIRIN EC 81 MG PO TBEC: 325.0000 mg | DELAYED_RELEASE_TABLET | Freq: Every day | ORAL | 11 refills | Status: DC

## 2020-12-23 MED ORDER — SEVOFLURANE IN SOLN
RESPIRATORY_TRACT | Status: AC
  Filled 2020-12-23: qty 250

## 2020-12-23 MED ORDER — FENTANYL CITRATE (PF) 100 MCG/2ML IJ SOLN
INTRAMUSCULAR | Status: DC | PRN
  Administered 2020-12-23 (×2): 50 ug via INTRAVENOUS

## 2020-12-23 MED ORDER — DOCUSATE SODIUM 100 MG PO CAPS: 100.0000 mg | ORAL_CAPSULE | Freq: Every day | ORAL | 2 refills | Status: DC | PRN

## 2020-12-23 MED ORDER — PROMETHAZINE HCL 25 MG/ML IJ SOLN
6.2500 mg | INTRAMUSCULAR | Status: DC | PRN
Start: 2020-12-23 — End: 2020-12-23

## 2020-12-23 MED ORDER — PROPOFOL 10 MG/ML IV BOLUS
INTRAVENOUS | Status: DC | PRN
  Administered 2020-12-23: 50 mg via INTRAVENOUS
  Administered 2020-12-23: 150 mg via INTRAVENOUS

## 2020-12-23 MED ORDER — CHLORHEXIDINE GLUCONATE CLOTH 2 % EX PADS
6.0000 | MEDICATED_PAD | Freq: Once | CUTANEOUS | Status: AC
Start: 2020-12-23 — End: 2020-12-23
  Administered 2020-12-23: 6 via TOPICAL

## 2020-12-23 MED ORDER — MIDAZOLAM HCL 2 MG/2ML IJ SOLN
INTRAMUSCULAR | Status: AC
  Filled 2020-12-23: qty 2

## 2020-12-23 MED ORDER — ACETAMINOPHEN 500 MG PO TABS
ORAL_TABLET | ORAL | Status: AC
  Administered 2020-12-23: 1000 mg via ORAL
  Filled 2020-12-23: qty 2

## 2020-12-23 SURGICAL SUPPLY — 49 items
ADAPTER IRRIG TUBE 2 SPIKE SOL (ADAPTER) IMPLANT
BLADE FULL RADIUS 3.5 (BLADE) IMPLANT
BLADE SHAVER 4.5X7 STR FR (MISCELLANEOUS) IMPLANT
BUR BR 5.5 WIDE MOUTH (BURR) IMPLANT
CNTNR SPEC 2.5X3XGRAD LEK (MISCELLANEOUS)
CONT SPEC 4OZ STER OR WHT (MISCELLANEOUS)
CONTAINER SPEC 2.5X3XGRAD LEK (MISCELLANEOUS) IMPLANT
COOLER POLAR GLACIER W/PUMP (MISCELLANEOUS) ×2 IMPLANT
CUFF TOURN SGL QUICK 24 (TOURNIQUET CUFF)
CUFF TOURN SGL QUICK 34 (TOURNIQUET CUFF) ×1
CUFF TRNQT CYL 24X4X16.5-23 (TOURNIQUET CUFF) IMPLANT
CUFF TRNQT CYL 34X4.125X (TOURNIQUET CUFF) ×1 IMPLANT
DEVICE SUCT BLK HOLE OR FLOOR (MISCELLANEOUS) IMPLANT
DRAPE ARTHRO LIMB 89X125 STRL (DRAPES) ×2 IMPLANT
DRAPE IMP U-DRAPE 54X76 (DRAPES) ×2 IMPLANT
DURAPREP 26ML APPLICATOR (WOUND CARE) ×6 IMPLANT
GAUZE SPONGE 4X4 12PLY STRL (GAUZE/BANDAGES/DRESSINGS) ×2 IMPLANT
GAUZE XEROFORM 1X8 LF (GAUZE/BANDAGES/DRESSINGS) IMPLANT
GLOVE SURG ORTHO LTX SZ9 (GLOVE) ×4 IMPLANT
GLOVE SURG UNDER POLY LF SZ9 (GLOVE) ×2 IMPLANT
GOWN STRL REUS TWL 2XL XL LVL4 (GOWN DISPOSABLE) ×2 IMPLANT
GOWN STRL REUS W/ TWL LRG LVL3 (GOWN DISPOSABLE) ×1 IMPLANT
GOWN STRL REUS W/TWL LRG LVL3 (GOWN DISPOSABLE) ×1
IV LACTATED RINGER IRRG 3000ML (IV SOLUTION) ×4
IV LR IRRIG 3000ML ARTHROMATIC (IV SOLUTION) ×4 IMPLANT
KIT TURNOVER KIT A (KITS) ×2 IMPLANT
MANIFOLD NEPTUNE II (INSTRUMENTS) ×2 IMPLANT
MAT ABSORB  FLUID 56X50 GRAY (MISCELLANEOUS) ×1
MAT ABSORB FLUID 56X50 GRAY (MISCELLANEOUS) ×1 IMPLANT
NEEDLE HYPO 22GX1.5 SAFETY (NEEDLE) ×2 IMPLANT
PACK ARTHROSCOPY KNEE (MISCELLANEOUS) ×2 IMPLANT
PAD ABD DERMACEA PRESS 5X9 (GAUZE/BANDAGES/DRESSINGS) ×4 IMPLANT
PAD WRAPON POLAR KNEE (MISCELLANEOUS) ×1 IMPLANT
SHAVER BLADE BONE CUTTER  5.5 (BLADE)
SHAVER BLADE BONE CUTTER 4.5 (BLADE) ×2 IMPLANT
SHAVER BLADE BONE CUTTER 5.5 (BLADE) IMPLANT
SHAVER BLADE TAPERED BLUNT 4 (BLADE) ×2 IMPLANT
SOL PREP PVP 2OZ (MISCELLANEOUS)
SOLUTION PREP PVP 2OZ (MISCELLANEOUS) IMPLANT
SPONGE T-LAP 18X18 ~~LOC~~+RFID (SPONGE) ×2 IMPLANT
STRIP CLOSURE SKIN 1/2X4 (GAUZE/BANDAGES/DRESSINGS) ×2 IMPLANT
SUT ETHILON 4-0 (SUTURE) ×1
SUT ETHILON 4-0 FS2 18XMFL BLK (SUTURE) ×1
SUTURE ETHLN 4-0 FS2 18XMF BLK (SUTURE) ×1 IMPLANT
TUBING INFLOW SET DBFLO PUMP (TUBING) ×2 IMPLANT
TUBING OUTFLOW SET DBLFO PUMP (TUBING) ×2 IMPLANT
WAND WEREWOLF FLOW 90D (MISCELLANEOUS) IMPLANT
WATER STERILE IRR 500ML POUR (IV SOLUTION) IMPLANT
WRAPON POLAR PAD KNEE (MISCELLANEOUS) ×2

## 2020-12-23 NOTE — Anesthesia Preprocedure Evaluation (Addendum)
Anesthesia Evaluation  Patient identified by MRN, date of birth, ID band Patient awake    Reviewed: Allergy & Precautions, NPO status , Patient's Chart, lab work & pertinent test results  Airway Mallampati: III       Dental  (+) Edentulous Upper, Edentulous Lower   Pulmonary neg pulmonary ROS,    Pulmonary exam normal        Cardiovascular Exercise Tolerance: Good METS: 3 - Mets hypertension, Pt. on medications Normal cardiovascular exam     Neuro/Psych negative neurological ROS  negative psych ROS   GI/Hepatic negative GI ROS, Neg liver ROS,   Endo/Other  diabetes, Well Controlled, Type 2, Insulin Dependent, Oral Hypoglycemic Agents  Renal/GU CRFRenal disease  negative genitourinary   Musculoskeletal  (+) Arthritis , Osteoarthritis,    Abdominal (+) + obese,   Peds negative pediatric ROS (+)  Hematology negative hematology ROS (+)   Anesthesia Other Findings   Reproductive/Obstetrics negative OB ROS                            Anesthesia Physical Anesthesia Plan  ASA: 2  Anesthesia Plan: General   Post-op Pain Management:    Induction: Intravenous  PONV Risk Score and Plan:   Airway Management Planned: LMA  Additional Equipment:   Intra-op Plan:   Post-operative Plan: Extubation in OR  Informed Consent: I have reviewed the patients History and Physical, chart, labs and discussed the procedure including the risks, benefits and alternatives for the proposed anesthesia with the patient or authorized representative who has indicated his/her understanding and acceptance.   Patient has DNR.  Suspend DNR.   Dental advisory given  Plan Discussed with: Anesthesiologist, CRNA and Surgeon  Anesthesia Plan Comments: (Patient consented for risks of anesthesia including but not limited to:  - adverse reactions to medications - damage to eyes, teeth, lips or other oral mucosa -  nerve damage due to positioning  - sore throat or hoarseness - Damage to heart, brain, nerves, lungs, other parts of body or loss of life  Patient voiced understanding.)      Anesthesia Quick Evaluation

## 2020-12-23 NOTE — Discharge Instructions (Signed)

## 2020-12-23 NOTE — Transfer of Care (Signed)
Immediate Anesthesia Transfer of Care Note  Patient: Matthew [name]  Procedure(s) Performed: RIGHT ARTHROSCOPY KNEE PARTIAL MEDIAL AND LATERAL MENISCECTOMY (Right: Knee)  Patient Location: PACU  Anesthesia Type:General  Level of Consciousness: drowsy and patient cooperative  Airway & Oxygen Therapy: Patient Spontanous Breathing and Patient connected to face mask oxygen  Post-op Assessment: Report given to RN and Post -op Vital signs reviewed and stable  Post vital signs: Reviewed and stable  Last Vitals:  Vitals Value Taken Time  BP 107/70 12/23/20 1144  Temp    Pulse 59 12/23/20 1144  Resp 14 12/23/20 1144  SpO2 98 % 12/23/20 1144    Last Pain:  Vitals:   12/23/20 0854  PainSc: 3          Complications: No notable events documented.

## 2020-12-23 NOTE — Anesthesia Procedure Notes (Signed)
Procedure Name: LMA Insertion Date/Time: 12/23/2020 10:11 AM Performed by: Debe Coder, CRNA Pre-anesthesia Checklist: Patient identified, Emergency Drugs available, Suction available and Patient being monitored Patient Re-evaluated:Patient Re-evaluated prior to induction Oxygen Delivery Method: Circle system utilized Preoxygenation: Pre-oxygenation with 100% oxygen Induction Type: IV induction Ventilation: Mask ventilation without difficulty LMA: LMA inserted LMA Size: 5.0 Number of attempts: 1 Placement Confirmation: positive ETCO2 and breath sounds checked- equal and bilateral Tube secured with: Tape Dental Injury: Teeth and Oropharynx as per pre-operative assessment

## 2020-12-23 NOTE — Op Note (Signed)
PATIENT:  Matthew [name]  PRE-OPERATIVE DIAGNOSIS: Right knee medial lateral meniscal tears  POST-OPERATIVE DIAGNOSIS:  Same  PROCEDURE: Right knee arthroscopic partial medial and lateral meniscectomies, chondroplasty of the medial femoral condyle and trochlea and excision of medial plica and limited synovectomy.  SURGEON:  Thornton Park, MD  ANESTHESIA:   General with LMA  PREOPERATIVE INDICATIONS:  Matthew [name]  75 y.o. male with a diagnosis of TEAR OF MEDIAL MENISCUS who failed conservative management and elected for surgical management.    The risks benefits and alternatives were discussed with the patient preoperatively including the risks of infection, bleeding, nerve injury, knee stiffness, persistent pain, osteoarthritis and the need for further surgery. Medical  risks include DVT and pulmonary embolism, myocardial infarction, stroke, pneumonia, respiratory failure and death. The patient understood these risks and wished to proceed.   OPERATIVE FINDINGS: Patient had tricompartmental osteoarthritis with diffuse chondral wear especially in the medial femoral condyle and trochlea.  She had degenerative tears of both the medial and lateral meniscus with a displaced undersurface flap of the medial meniscus into the gutter along the body of the meniscus.  OPERATIVE PROCEDURE: Patient was met in the preoperative area. The operative extremity was signed with the word yes and my initials according the hospital's correct site of surgery protocol.  The patient was brought to the operating room where they was placed supine on the operative table. General anesthesia was administered. The patient was prepped and draped in a sterile fashion.  A timeout was performed to verify the patient's name, date of birth, medical record number, correct site of surgery correct procedure to be performed. It was also used to verify the patient received antibiotics that all appropriate instruments, and  radiographic studies were available in the room. Once all in attendance were in agreement, the case began.  Proposed arthroscopy incisions were drawn out with a surgical marker. These were pre-injected with 1% lidocaine plain. An 11 blade was used to establish an inferior lateral and inferomedial portals. The inferomedial portal was created using a 18-gauge spinal needle under direct visualization.  A full diagnostic examination of the knee was performed including the suprapatellar pouch, patellofemoral joint, medial lateral compartments as well as the medial lateral gutters, the intercondylar notch in the posterior knee.  Patient had a mid synovectomy of all 3 compartments of the right knee.  This was performed with a Dyonics flyer shaver blade and 90 degree werewolf wand.  The medial compartment was then examined and the patient was found to have a complex tear involving the posterior horn extending into the medial meniscal body with a displaced inferior flap into the lateral alongside the tibia.  This was reduced using a hook probe.  A partial medial meniscectomy was then performed of the posterior horn and body using the Dyonics flyer shaver blade and straight duckbill biter.  The patient's ACL was intact.  The patient's knee was then placed in a figure-of-four position.  There was sensitive degenerative tearing involving the body posterior horn and posterior root of the lateral meniscus without root detachment.  A partial lateral meniscectomy was also performed using a straight duckbill biter and Dyonics flyer blade.  Both meniscal tears were debrided  until a stable rim was achieved.   A chondroplasty of the medial femoral condyle and trochlea was also performed using a Dyonics flyer shaver blade.  Final arthroscopic images of all 3 compartments were taken.  The knee was then copiously lavaged. All arthroscopic instruments were removed.  The 2 arthroscopy portals were closed with 4-0 nylon.  Patient had 30 cc of 0.5% Marcaine with epi injected intra-articularly at the conclusion the case to help with postoperative pain control.  Steri-Strips were applied along with a dry sterile and compressive dressing.  The patient was brought to the PACU in stable condition. I was scrubbed and present for the entire case and all sharp and instrument counts were correct at the conclusion the case. I spoke with the patient's wife postoperatively to let her know the case was performed without complication and the patient was stable in the recovery room.   Timoteo Gaul, MD

## 2020-12-23 NOTE — H&P (Signed)
PREOPERATIVE H&P  Chief Complaint: Right Knee medial and lateral Menisci Tears  HPI: Matthew [name] is a 75 y.o. male who presents for preoperative history and physical with a diagnosis of Right Knee medial and lateral menisci Tears. Symptoms of pain and mechanical symptoms are significantly impairing activities of daily living.  Patient is failed nonoperative treatment and wished to proceed with arthroscopic partial medial and lateral meniscectomies.   Past Medical History:  Diagnosis Date   Arthritis    bil knees and hips and back   Asthma    as a child   Hyperlipidemia    Hypertension    Sleep apnea    does not use cpap since losing weight   Type 2 diabetes mellitus (HCC)    Past Surgical History:  Procedure Laterality Date   COLONOSCOPY     HERNIA REPAIR     Social History   Socioeconomic History   Marital status: Married    Spouse name: Agustin Swatek   Number of children: Not on file   Years of education: Not on file   Highest education level: Not on file  Occupational History   Occupation: retired  Tobacco Use   Smoking status: Never   Smokeless tobacco: Never  Vaping Use   Vaping Use: Never used  Substance and Sexual Activity   Alcohol use: No    Comment: rare   Drug use: No   Sexual activity: Not on file  Other Topics Concern   Not on file  Social History Narrative   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   Social Determinants of Radio broadcast assistant Strain: Low Risk    Difficulty of Paying Living Expenses: Not hard at all  Food Insecurity: No Food Insecurity   Worried About Charity fundraiser in the Last Year: Never true   Arboriculturist in the Last Year: Never true  Transportation Needs: No Transportation Needs   Lack of Transportation (Medical): No   Lack of Transportation (Non-Medical): No  Physical Activity: Inactive   Days of Exercise per Week: 0 days   Minutes of Exercise per Session: 0 min  Stress:  Stress Concern Present   Feeling of Stress : To some extent  Social Connections: Engineer, building services of Communication with Friends and Family: More than three times a week   Frequency of Social Gatherings with Friends and Family: More than three times a week   Attends Religious Services: More than 4 times per year   Active Member of [employer] or Organizations: Yes   Attends Music therapist: More than 4 times per year   Marital Status: Married   Family History  Problem Relation Age of Onset   Diabetes Mother    Hypertension Father    Diabetes Sister    Diabetes Brother    Diabetes Maternal Grandmother    Allergies  Allergen Reactions   Ozempic (0.25 Or 0.5 Mg-Dose) [Semaglutide(0.25 Or 0.5mg -Dos)] Swelling   Prior to Admission medications   Medication Sig Start Date End Date Taking? Authorizing Provider  aspirin EC 81 MG tablet Take 81 mg by mouth daily.   Yes [provider]  atorvastatin (LIPITOR) 20 MG tablet Take 1 tablet (20 mg total) by mouth daily. Patient taking differently: Take 20 mg by mouth every morning. 11/18/20  Yes [name], Avanti, MD  benazepril (LOTENSIN) 40 MG tablet Take 1 tablet (40 mg total) by mouth daily. Patient taking  differently: Take 40 mg by mouth every morning. 11/18/20  Yes [name], Avanti, MD  insulin detemir (LEVEMIR FLEXTOUCH) 100 UNIT/ML FlexPen Inject 35 units into the skin at bedtime. Patient taking differently: Inject 25-30 Units into the skin at bedtime. 10/06/20  Yes [name], Avanti, MD  Insulin Pen Needle 31G X 8 MM MISC Use one needle to administer insulin into skin nightly. 03/19/19  Yes Cannady, Henrine Screws T, NP  metFORMIN (GLUCOPHAGE) 1000 MG tablet Take 1 tablet (1,000 mg total) by mouth 2 (two) times daily with a meal. 11/18/20  Yes [name], Avanti, MD  Multiple Vitamins-Minerals (CENTRUM SILVER 50+MEN) TABS Take 1 tablet by mouth daily.   Yes [provider]  vitamin B-12 (CYANOCOBALAMIN) 500 MCG tablet Take 500 mcg  by mouth daily.   Yes [provider]  dapagliflozin propanediol (FARXIGA) 10 MG TABS tablet Take 1 tablet (10 mg total) by mouth daily before breakfast. Patient not taking: Reported on 12/09/2020 07/21/20   Marnee Guarneri T, NP  hydrochlorothiazide (MICROZIDE) 12.5 MG capsule Take 1 capsule (12.5 mg total) by mouth daily. Patient not taking: Reported on 12/09/2020 09/19/19   Volney American, PA-C  lidocaine (LIDODERM) 5 % Place 1 patch onto the skin daily. Remove & Discard patch within 12 hours or as directed by MD Patient not taking: Reported on 12/09/2020 09/21/20   Charlynne Cousins, MD     Positive ROS: All other systems have been reviewed and were otherwise negative with the exception of those mentioned in the HPI and as above.  Physical Exam: General: Alert, no acute distress Cardiovascular: Regular rate and rhythm, no murmurs rubs or gallops.  No pedal edema Respiratory: Clear to auscultation bilaterally, no wheezes rales or rhonchi. No cyanosis, no use of accessory musculature GI: No organomegaly, abdomen is soft and non-tender nondistended with positive bowel sounds. Skin: Skin intact, no lesions within the operative field. Neurologic: Sensation intact distally Psychiatric: Patient is competent for consent with normal mood and affect Lymphatic: No cervical lymphadenopathy  MUSCULOSKELETAL: Right knee: Patient skin is intact.  There is no erythema ecchymosis or significant effusion.  His range of motion from 0 120 degrees.  He has medial greater than lateral joint line tenderness and pain with McMurray's testing.  He is neurovascular intact with 5 out of 5 strength.  He has no ligamentous laxity.  Assessment: Right Knee medial and lateral Meniscui Tears  Plan: Plan for Procedure(s): RIGHT KNEE ARTHROSCOPIC PARTIAL MEDIAL AND LATERAL MENISCECTOMIES  I reviewed the details of the operation as well as the postoperative course with the patient.  I performed a preop  history and physical at the bedside today.  I marked the right leg according to hospital's correct site of surgery protocol.  Patient was seen in the preoperative area with the circulating nurse.  I also discussed the risks and benefits of surgery. The risks include but are not limited to infection, bleeding, nerve or blood vessel injury, joint stiffness or loss of motion, persistent pain, weakness or instability, retear of the menisci and the need for further surgery. Medical risks include but are not limited to DVT and pulmonary embolism, myocardial infarction, stroke, pneumonia, respiratory failure and death. Patient understood these risks and wished to proceed.   I answered all the patient's questions.  He understands that he has arthritis in his right knee and that he may have some persistent pain postop due to arthritis.    Thornton Park, MD   12/23/2020 10:00 AM

## 2020-12-24 ENCOUNTER — Telehealth: Payer: Self-pay | Admitting: Internal Medicine

## 2020-12-24 ENCOUNTER — Ambulatory Visit: Payer: Medicare HMO | Admitting: Internal Medicine

## 2020-12-24 NOTE — Telephone Encounter (Signed)
Routing to provider to advise on medication.

## 2020-12-24 NOTE — Telephone Encounter (Signed)
Copied from [place] [id number]. Topic: General - Other >> Dec 23, 2020  3:34 PM Matthew [name] A wrote: Reason for CRM: The patient has been directed to take 325 MG aspirin daily for six weeks to prevent clotting after having same day surgery on their right knee today 12/23/20  The patient was directed to contact their PCP to approve taking this medication before they began   Please contact further if needed

## 2020-12-26 NOTE — Anesthesia Postprocedure Evaluation (Signed)
Anesthesia Post Note  Patient: Matthew [name]  Procedure(s) Performed: RIGHT ARTHROSCOPY KNEE PARTIAL MEDIAL AND LATERAL MENISCECTOMY (Right: Knee)  Patient location during evaluation: PACU Anesthesia Type: General Level of consciousness: awake and alert Pain management: pain level controlled Vital Signs Assessment: post-procedure vital signs reviewed and stable Respiratory status: spontaneous breathing, nonlabored ventilation and respiratory function stable Cardiovascular status: blood pressure returned to baseline and stable Postop Assessment: no apparent nausea or vomiting Anesthetic complications: no   No notable events documented.   Last Vitals:  Vitals:   12/23/20 1215 12/23/20 1232  BP: 130/74 (!) 141/81  Pulse:  63  Resp:  18  Temp:  (!) 35.8 C  SpO2:  98%    Last Pain:  Vitals:   12/23/20 1232  TempSrc: Temporal  PainSc: 0-No pain                 [place] Ouch

## 2020-12-27 DIAGNOSIS — I10 Essential (primary) hypertension: Secondary | ICD-10-CM

## 2020-12-27 DIAGNOSIS — E119 Type 2 diabetes mellitus without complications: Secondary | ICD-10-CM | POA: Diagnosis not present

## 2020-12-27 DIAGNOSIS — E1159 Type 2 diabetes mellitus with other circulatory complications: Secondary | ICD-10-CM | POA: Diagnosis not present

## 2020-12-27 DIAGNOSIS — M1990 Unspecified osteoarthritis, unspecified site: Secondary | ICD-10-CM

## 2020-12-27 DIAGNOSIS — Z794 Long term (current) use of insulin: Secondary | ICD-10-CM | POA: Diagnosis not present

## 2020-12-27 DIAGNOSIS — E1169 Type 2 diabetes mellitus with other specified complication: Secondary | ICD-10-CM

## 2020-12-27 DIAGNOSIS — E1165 Type 2 diabetes mellitus with hyperglycemia: Secondary | ICD-10-CM | POA: Diagnosis not present

## 2020-12-27 DIAGNOSIS — E785 Hyperlipidemia, unspecified: Secondary | ICD-10-CM | POA: Diagnosis not present

## 2020-12-27 DIAGNOSIS — I152 Hypertension secondary to endocrine disorders: Secondary | ICD-10-CM | POA: Diagnosis not present

## 2020-12-29 NOTE — Telephone Encounter (Signed)
Patient states she is frustrated because it has been a week since she placed the first call to our office and she has not heard anything back as what to do with her husband medication. Patient states she was told to reach out to the patient's PCP office to see if it is OK for patient to take the Aspirin 325 MG. Patient wife states she is tired of being given the run around as to being passed back and forth of who's office to reach out to. Patient states she would like for the doctor or her nurse to figure something out and let her know. Patient wife states the patient has been taking the medication due to not hearing back from anyone. Patient and wife are not happy. Please advise?

## 2020-12-29 NOTE — Telephone Encounter (Signed)
Returned patient's call about taking ASA 325mg  that his surgeon gave him after his knee surgery. Patient states that he was just calling to let Dr. Neomia Dear know that his surgeon placed him on the ASA 325mg  to reduce clot risk because he has Diabetes. Dr. Neomia Dear is aware and has no issue with patient taking the ASA 325mg  as prescribed by his surgeon

## 2020-12-29 NOTE — Telephone Encounter (Signed)
Can you  please call pt asap and see whats going on thnx. No tsure what surgery they had or why they were placed on ASA, need to verify with surg as I havent seen them since, if they have quesitons need to see this pt virtually

## 2021-01-17 ENCOUNTER — Ambulatory Visit (INDEPENDENT_AMBULATORY_CARE_PROVIDER_SITE_OTHER): Payer: Medicare HMO | Admitting: *Deleted

## 2021-01-17 ENCOUNTER — Other Ambulatory Visit: Payer: Medicare HMO

## 2021-01-17 DIAGNOSIS — Z0000 Encounter for general adult medical examination without abnormal findings: Secondary | ICD-10-CM | POA: Diagnosis not present

## 2021-01-17 NOTE — Patient Instructions (Signed)
Matthew [name] , Thank you for taking time to come for your Medicare Wellness Visit. I appreciate your ongoing commitment to your health goals. Please review the following plan we discussed and let me know if I can assist you in the future.   Screening recommendations/referrals: Colonoscopy: Up to date Recommended yearly ophthalmology/optometry visit for glaucoma screening and checkup Recommended yearly dental visit for hygiene and checkup  Vaccinations: Influenza vaccine: up to date Pneumococcal vaccine: up to date Tdap vaccine: up to date Shingles vaccine: up to date    Advanced directives: on file  Conditions/risks identified:   Next appointment: 01-25-2021 @ 3:20  [name]  Preventive Care 73 Years and Older, Male Preventive care refers to lifestyle choices and visits with your health care provider that can promote health and wellness. What does preventive care include? A yearly physical exam. This is also called an annual well check. Dental exams once or twice a year. Routine eye exams. Ask your health care provider how often you should have your eyes checked. Personal lifestyle choices, including: Daily care of your teeth and gums. Regular physical activity. Eating a healthy diet. Avoiding tobacco and drug use. Limiting alcohol use. Practicing safe sex. Taking low doses of aspirin every day. Taking vitamin and mineral supplements as recommended by your health care provider. What happens during an annual well check? The services and screenings done by your health care provider during your annual well check will depend on your age, overall health, lifestyle risk factors, and family history of disease. Counseling  Your health care provider may ask you questions about your: Alcohol use. Tobacco use. Drug use. Emotional well-being. Home and relationship well-being. Sexual activity. Eating habits. History of falls. Memory and ability to understand (cognition). Work and work  Statistician. Screening  You may have the following tests or measurements: Height, weight, and BMI. Blood pressure. Lipid and cholesterol levels. These may be checked every 5 years, or more frequently if you are over 67 years old. Skin check. Lung cancer screening. You may have this screening every year starting at age 19 if you have a 30-pack-year history of smoking and currently smoke or have quit within the past 15 years. Fecal occult blood test (FOBT) of the stool. You may have this test every year starting at age 50. Flexible sigmoidoscopy or colonoscopy. You may have a sigmoidoscopy every 5 years or a colonoscopy every 10 years starting at age 102. Prostate cancer screening. Recommendations will vary depending on your family history and other risks. Hepatitis C blood test. Hepatitis B blood test. Sexually transmitted disease (STD) testing. Diabetes screening. This is done by checking your blood sugar (glucose) after you have not eaten for a while (fasting). You may have this done every 1-3 years. Abdominal aortic aneurysm (AAA) screening. You may need this if you are a current or former smoker. Osteoporosis. You may be screened starting at age 34 if you are at high risk. Talk with your health care provider about your test results, treatment options, and if necessary, the need for more tests. Vaccines  Your health care provider may recommend certain vaccines, such as: Influenza vaccine. This is recommended every year. Tetanus, diphtheria, and acellular pertussis (Tdap, Td) vaccine. You may need a Td booster every 10 years. Zoster vaccine. You may need this after age 13. Pneumococcal 13-valent conjugate (PCV13) vaccine. One dose is recommended after age 78. Pneumococcal polysaccharide (PPSV23) vaccine. One dose is recommended after age 77. Talk to your health care provider about which screenings  and vaccines you need and how often you need them. This information is not intended to replace  advice given to you by your health care provider. Make sure you discuss any questions you have with your health care provider. Document Released: 03/12/2015 Document Revised: 11/03/2015 Document Reviewed: 12/15/2014 Elsevier Interactive Patient Education  2017 [employer] Prevention in the Home Falls can cause injuries. They can happen to people of all ages. There are many things you can do to make your home safe and to help prevent falls. What can I do on the outside of my home? Regularly fix the edges of walkways and driveways and fix any cracks. Remove anything that might make you trip as you walk through a door, such as a raised step or threshold. Trim any bushes or trees on the path to your home. Use bright outdoor lighting. Clear any walking paths of anything that might make someone trip, such as rocks or tools. Regularly check to see if handrails are loose or broken. Make sure that both sides of any steps have handrails. Any raised decks and porches should have guardrails on the edges. Have any leaves, snow, or ice cleared regularly. Use sand or salt on walking paths during winter. Clean up any spills in your garage right away. This includes oil or grease spills. What can I do in the bathroom? Use night lights. Install grab bars by the toilet and in the tub and shower. Do not use towel bars as grab bars. Use non-skid mats or decals in the tub or shower. If you need to sit down in the shower, use a plastic, non-slip stool. Keep the floor dry. Clean up any water that spills on the floor as soon as it happens. Remove soap buildup in the tub or shower regularly. Attach bath mats securely with double-sided non-slip rug tape. Do not have throw rugs and other things on the floor that can make you trip. What can I do in the bedroom? Use night lights. Make sure that you have a light by your bed that is easy to reach. Do not use any sheets or blankets that are too big for your bed.  They should not hang down onto the floor. Have a firm chair that has side arms. You can use this for support while you get dressed. Do not have throw rugs and other things on the floor that can make you trip. What can I do in the kitchen? Clean up any spills right away. Avoid walking on wet floors. Keep items that you use a lot in easy-to-reach places. If you need to reach something above you, use a strong step stool that has a grab bar. Keep electrical cords out of the way. Do not use floor polish or wax that makes floors slippery. If you must use wax, use non-skid floor wax. Do not have throw rugs and other things on the floor that can make you trip. What can I do with my stairs? Do not leave any items on the stairs. Make sure that there are handrails on both sides of the stairs and use them. Fix handrails that are broken or loose. Make sure that handrails are as long as the stairways. Check any carpeting to make sure that it is firmly attached to the stairs. Fix any carpet that is loose or worn. Avoid having throw rugs at the top or bottom of the stairs. If you do have throw rugs, attach them to the floor with carpet tape.  Make sure that you have a light switch at the top of the stairs and the bottom of the stairs. If you do not have them, ask someone to add them for you. What else can I do to help prevent falls? Wear shoes that: Do not have high heels. Have rubber bottoms. Are comfortable and fit you well. Are closed at the toe. Do not wear sandals. If you use a stepladder: Make sure that it is fully opened. Do not climb a closed stepladder. Make sure that both sides of the stepladder are locked into place. Ask someone to hold it for you, if possible. Clearly mark and make sure that you can see: Any grab bars or handrails. First and last steps. Where the edge of each step is. Use tools that help you move around (mobility aids) if they are needed. These  include: Canes. Walkers. Scooters. Crutches. Turn on the lights when you go into a dark area. Replace any light bulbs as soon as they burn out. Set up your furniture so you have a clear path. Avoid moving your furniture around. If any of your floors are uneven, fix them. If there are any pets around you, be aware of where they are. Review your medicines with your doctor. Some medicines can make you feel dizzy. This can increase your chance of falling. Ask your doctor what other things that you can do to help prevent falls. This information is not intended to replace advice given to you by your health care provider. Make sure you discuss any questions you have with your health care provider. Document Released: 12/10/2008 Document Revised: 07/22/2015 Document Reviewed: 03/20/2014 Elsevier Interactive Patient Education  2017 [employer].

## 2021-01-17 NOTE — Progress Notes (Signed)
Subjective:   Matthew [name] is a 75 y.o. male who presents for Medicare Annual/Subsequent preventive examination.  I connected with  [name] on 01/17/21 by a telephone enabled telemedicine application and verified that I am speaking with the correct person using two identifiers.   I discussed the limitations of evaluation and management by telemedicine. The patient expressed understanding and agreed to proceed.  Patient location: home  Provider location: Tele-Health not in office    Review of Systems     Cardiac Risk Factors include: advanced age (>66men, >24 women);diabetes mellitus;hypertension;male gender;obesity (BMI >30kg/m2)     Objective:    Today's Vitals   There is no height or weight on file to calculate BMI.  Advanced Directives 01/17/2021 12/23/2020 12/13/2020 06/16/2019 08/12/2018  Does Patient Have a Medical Advance Directive? Yes Yes Yes Yes Yes  Type of Advance Directive [place] in Chart? Yes - validated most recent copy scanned in chart (See row information) No - copy requested - No - copy requested No - copy requested    Current Medications (verified) Outpatient Encounter Medications as of 01/17/2021  Medication Sig   aspirin EC 81 MG tablet Take 4 tablets (325 mg total) by mouth daily.   atorvastatin (LIPITOR) 20 MG tablet Take 1 tablet (20 mg total) by mouth daily. (Patient taking differently: Take 20 mg by mouth every morning.)   benazepril (LOTENSIN) 40 MG tablet Take 1 tablet (40 mg total) by mouth daily. (Patient taking differently: Take 40 mg by mouth every morning.)   docusate sodium (COLACE) 100 MG capsule Take 1 capsule (100 mg total) by mouth daily as needed.   HYDROcodone-acetaminophen (NORCO) 5-325 MG tablet Take 1 tablet by mouth every 4 (four) hours as needed for moderate pain.   insulin  detemir (LEVEMIR FLEXTOUCH) 100 UNIT/ML FlexPen Inject 35 units into the skin at bedtime. (Patient taking differently: Inject 25-30 Units into the skin at bedtime.)   Insulin Pen Needle 31G X 8 MM MISC Use one needle to administer insulin into skin nightly.   metFORMIN (GLUCOPHAGE) 1000 MG tablet Take 1 tablet (1,000 mg total) by mouth 2 (two) times daily with a meal.   Multiple Vitamins-Minerals (CENTRUM SILVER 50+MEN) TABS Take 1 tablet by mouth daily.   ondansetron (ZOFRAN) 4 MG tablet Take 1 tablet (4 mg total) by mouth every 8 (eight) hours as needed for nausea or vomiting.   vitamin B-12 (CYANOCOBALAMIN) 500 MCG tablet Take 500 mcg by mouth daily.   dapagliflozin propanediol (FARXIGA) 10 MG TABS tablet Take 1 tablet (10 mg total) by mouth daily before breakfast. (Patient not taking: Reported on 12/09/2020)   hydrochlorothiazide (MICROZIDE) 12.5 MG capsule Take 1 capsule (12.5 mg total) by mouth daily. (Patient not taking: Reported on 12/09/2020)   lidocaine (LIDODERM) 5 % Place 1 patch onto the skin daily. Remove & Discard patch within 12 hours or as directed by MD (Patient not taking: Reported on 12/09/2020)   No facility-administered encounter medications on file as of 01/17/2021.    Allergies (verified) Ozempic (0.25 or 0.5 mg-dose) [semaglutide(0.25 or 0.5mg -dos)]   History: Past Medical History:  Diagnosis Date   Arthritis    bil knees and hips and back   Asthma    as a child   Hyperlipidemia    Hypertension    Sleep apnea    does not use cpap since  losing weight   Type 2 diabetes mellitus ([place])    Past Surgical History:  Procedure Laterality Date   COLONOSCOPY     HERNIA REPAIR     KNEE ARTHROSCOPY Right 12/23/2020   Procedure: RIGHT ARTHROSCOPY KNEE PARTIAL MEDIAL AND LATERAL MENISCECTOMY;  Surgeon: Thornton Park, MD;  Location: [facility] ORS;  Service: Orthopedics;  Laterality: Right;   Family History  Problem Relation Age of Onset   Diabetes Mother    Hypertension  Father    Diabetes Sister    Diabetes Brother    Diabetes Maternal Grandmother    Social History   Socioeconomic History   Marital status: Married    Spouse name: Kaivon Livesey   Number of children: Not on file   Years of education: Not on file   Highest education level: Not on file  Occupational History   Occupation: retired  Tobacco Use   Smoking status: Never   Smokeless tobacco: Never  Vaping Use   Vaping Use: Never used  Substance and Sexual Activity   Alcohol use: No    Comment: rare   Drug use: No   Sexual activity: Not on file  Other Topics Concern   Not on file  Social History Narrative   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   Social Determinants of Radio broadcast assistant Strain: Low Risk    Difficulty of Paying Living Expenses: Not hard at all  Food Insecurity: No Food Insecurity   Worried About Charity fundraiser in the Last Year: Never true   Arboriculturist in the Last Year: Never true  Transportation Needs: No Transportation Needs   Lack of Transportation (Medical): No   Lack of Transportation (Non-Medical): No  Physical Activity: Inactive   Days of Exercise per Week: 0 days   Minutes of Exercise per Session: 0 min  Stress: No Stress Concern Present   Feeling of Stress : Only a little  Social Connections: Engineer, building services of Communication with Friends and Family: Once a week   Frequency of Social Gatherings with Friends and Family: More than three times a week   Attends Religious Services: More than 4 times per year   Active Member of [employer] or Organizations: Yes   Attends Music therapist: More than 4 times per year   Marital Status: Married    Tobacco Counseling Counseling given: Not Answered   Clinical Intake:  Pre-visit preparation completed: Yes  Pain : No/denies pain     Nutritional Risks: None Diabetes: Yes CBG done?: No Did pt. bring in CBG monitor from home?:  No  How often do you need to have someone help you when you read instructions, pamphlets, or other written materials from your doctor or pharmacy?: 1 - Never  Diabetic?  Yes  Nutrition Risk Assessment:  Has the patient had any N/V/D within the last 2 months?  No  Does the patient have any non-healing wounds?  No  Has the patient had any unintentional weight loss or weight gain?  No   Diabetes:  Is the patient diabetic?  Yes  If diabetic, was a CBG obtained today?  No  Did the patient bring in their glucometer from home?  No  How often do you monitor your CBG's? 2 times daily.   Financial Strains and Diabetes Management:  Are you having any financial strains with the device, your supplies or your medication? No .  Does the  patient want to be seen by Chronic Care Management for management of their diabetes?  No  Would the patient like to be referred to a Nutritionist or for Diabetic Management?  No   Diabetic Exams:  Diabetic Eye Exam: Completed . Overdue for diabetic eye exam. Pt has been advised about the importance in completing this exam.  Diabetic Foot Exam:. Pt has been advised about the importance in completing this exam.    Interpreter Needed?: No  Information entered by :: Leroy Kennedy LPN   Activities of Daily Living In your present state of health, do you have any difficulty performing the following activities: 01/17/2021 12/13/2020  Hearing? N N  Vision? N N  Difficulty concentrating or making decisions? N N  Walking or climbing stairs? N Y  Dressing or bathing? N N  Doing errands, shopping? N -  Preparing Food and eating ? N -  Using the Toilet? N -  In the past six months, have you accidently leaked urine? N -  Do you have problems with loss of bowel control? N -  Managing your Medications? N -  Managing your Finances? N -  Housekeeping or managing your Housekeeping? N -  Some recent data might be hidden    Patient Care Team: Charlynne Cousins, MD as PCP  - General Vanita Ingles, RN as Case Manager ([place]) Vladimir Faster, [facility] as Pharmacist (Pharmacist)  Indicate any recent Medical Services you may have received from other than [facility] providers in the past year (date may be approximate).     Assessment:   This is a routine wellness examination for Matthew [name].  Hearing/Vision screen Hearing Screening - Comments:: No trouble hearing Vision Screening - Comments:: Up to date [facility] [address]  Dietary issues and exercise activities discussed: Current Exercise Habits: The patient does not participate in regular exercise at present   Goals Addressed             This Visit's Progress    Weight (lb) < 200 lb (90.7 kg)       Loose 20 pounds         Depression Screen PHQ 2/9 Scores 01/17/2021 10/06/2020 09/21/2020 07/21/2020 06/16/2019 08/12/2018 08/08/2017  PHQ - 2 Score 0 0 0 0 0 0 0    Fall Risk Fall Risk  01/17/2021 10/06/2020 09/21/2020 07/21/2020 06/16/2019  Falls in the past year? 1 0 0 0 0  Number falls in past yr: 0 0 0 0 0  Injury with Fall? 1 0 0 0 0  Risk for fall due to : - No Fall Risks No Fall Risks - -  Follow up Falls evaluation completed;Falls prevention discussed Falls evaluation completed Falls evaluation completed Falls evaluation completed -    FALL RISK PREVENTION PERTAINING TO THE HOME:  Any stairs in or around the home? No  If so, are there any without handrails? No  Home free of loose throw rugs in walkways, pet beds, electrical cords, etc? Yes  Adequate lighting in your home to reduce risk of falls? Yes   ASSISTIVE DEVICES UTILIZED TO PREVENT FALLS:  Life alert? No  Use of a cane, walker or w/c? No  Grab bars in the bathroom? Yes  Shower chair or bench in shower? No  Elevated toilet seat or a handicapped toilet? No   TIMED UP AND GO:  Was the test performed? No .    Cognitive Function:  Normal cognitive status assessed by direct observation by this Nurse Health Advisor.  No  abnormalities found.       6CIT Screen 06/16/2019 08/08/2017  What Year? 0 points 0 points  What month? 0 points 0 points  What time? 0 points 0 points  Count back from 20 0 points 0 points  Months in reverse 0 points 0 points  Repeat phrase 0 points 0 points  Total Score 0 0    Immunizations Immunization History  Administered Date(s) Administered   Fluad Quad(high Dose 65+) 01/04/2021   Influenza, High Dose Seasonal PF 12/29/2013, 11/15/2015   Influenza,inj,Quad PF,6+ Mos 12/30/2014, 12/04/2018   Influenza-Unspecified 12/29/2013, 11/15/2015, 12/25/2016   PFIZER(Purple Top)SARS-COV-2 Vaccination 04/24/2019, 05/20/2019, 07/13/2020, 01/04/2021   Pneumococcal Conjugate-13 12/30/2014   Pneumococcal Polysaccharide-23 03/24/2019   Pneumococcal-Unspecified 06/16/2011   Tdap 03/15/2011   Zoster Recombinat (Shingrix) 04/02/2019, 10/17/2019   Zoster, Live 06/16/2011    TDAP status: Up to date  Flu Vaccine status: Up to date  Pneumococcal vaccine status: Up to date  Covid-19 vaccine status: Completed vaccines  Qualifies for Shingles Vaccine? No   Zostavax completed Yes   Shingrix Completed?: Yes  Screening Tests Health Maintenance  Topic Date Due   COVID-19 Vaccine (5 - Booster for Pfizer series) 03/01/2021   TETANUS/TDAP  03/14/2021   HEMOGLOBIN A1C  05/09/2021   COLONOSCOPY (Pts 45-12yrs Insurance coverage will need to be confirmed)  06/28/2021   FOOT EXAM  07/21/2021   OPHTHALMOLOGY EXAM  07/21/2021   Pneumonia Vaccine 49+ Years old  Completed   INFLUENZA VACCINE  Completed   Hepatitis C Screening  Completed   Zoster Vaccines- Shingrix  Completed   HPV VACCINES  Aged Out    Health Maintenance  There are no preventive care reminders to display for this patient.   Colorectal cancer screening: Type of screening: Colonoscopy. Completed 2013. Repeat every 10 years  Lung Cancer Screening: (Low Dose CT Chest recommended if Age 56-80 years, 30 pack-year currently smoking  OR have quit w/in 15years.) does not qualify.   Lung Cancer Screening Referral:   Additional Screening:  Hepatitis C Screening: does not qualify; Completed 2019  Vision Screening: Recommended annual ophthalmology exams for early detection of glaucoma and other disorders of the eye. Is the patient up to date with their annual eye exam?  Yes  Who is the provider or what is the name of the office in which the patient attends annual eye exams? [facility] If pt is not established with a provider, would they like to be referred to a provider to establish care? No .   Dental Screening: Recommended annual dental exams for proper oral hygiene  Community Resource Referral / Chronic Care Management: CRR required this visit?  No   CCM required this visit?  No      Plan:     I have personally reviewed and noted the following in the patient's chart:   Medical and social history Use of alcohol, tobacco or illicit drugs  Current medications and supplements including opioid prescriptions. Patient is not currently taking opioid prescriptions. Functional ability and status Nutritional status Physical activity Advanced directives List of other physicians Hospitalizations, surgeries, and ER visits in previous 12 months Vitals Screenings to include cognitive, depression, and falls Referrals and appointments  In addition, I have reviewed and discussed with patient certain preventive protocols, quality metrics, and best practice recommendations. A written personalized care plan for preventive services as well as general preventive health recommendations were provided to patient.     Leroy Kennedy, LPN   [id number]   Nurse  Notes:

## 2021-01-24 ENCOUNTER — Ambulatory Visit: Payer: Medicare HMO | Admitting: Internal Medicine

## 2021-01-25 ENCOUNTER — Ambulatory Visit: Payer: Medicare HMO | Admitting: Internal Medicine

## 2021-01-25 ENCOUNTER — Other Ambulatory Visit: Payer: Medicare HMO

## 2021-01-28 ENCOUNTER — Telehealth: Payer: Medicare HMO

## 2021-01-31 ENCOUNTER — Other Ambulatory Visit: Payer: Self-pay

## 2021-01-31 ENCOUNTER — Encounter: Payer: Self-pay | Admitting: Internal Medicine

## 2021-01-31 ENCOUNTER — Other Ambulatory Visit: Payer: Medicare HMO

## 2021-01-31 ENCOUNTER — Ambulatory Visit (INDEPENDENT_AMBULATORY_CARE_PROVIDER_SITE_OTHER): Payer: Medicare HMO | Admitting: Internal Medicine

## 2021-01-31 VITALS — BP 118/60 | HR 81 | Temp 98.4°F | Ht 69.69 in | Wt 247.0 lb

## 2021-01-31 DIAGNOSIS — E1165 Type 2 diabetes mellitus with hyperglycemia: Secondary | ICD-10-CM

## 2021-01-31 DIAGNOSIS — Z794 Long term (current) use of insulin: Secondary | ICD-10-CM

## 2021-01-31 LAB — URINALYSIS, ROUTINE W REFLEX MICROSCOPIC
Bilirubin, UA: NEGATIVE
Ketones, UA: NEGATIVE
Leukocytes,UA: NEGATIVE
Nitrite, UA: NEGATIVE
Protein,UA: NEGATIVE
RBC, UA: NEGATIVE
Specific Gravity, UA: 1.025 (ref 1.005–1.030)
Urobilinogen, Ur: 0.2 mg/dL (ref 0.2–1.0)
pH, UA: 5.5 (ref 5.0–7.5)

## 2021-01-31 LAB — MICROALBUMIN, URINE WAIVED
Creatinine, Urine Waived: 100 mg/dL (ref 10–300)
Microalb, Ur Waived: 10 mg/L (ref 0–19)
Microalb/Creat Ratio: <30 mg/g (ref <30)

## 2021-01-31 LAB — BAYER DCA HB A1C WAIVED: HB A1C (BAYER DCA - WAIVED): 8.4 % — ABNORMAL HIGH (ref 4.8–5.6)

## 2021-01-31 MED ORDER — EMPAGLIFLOZIN 10 MG PO TABS: 10.0000 mg | ORAL_TABLET | Freq: Every day | ORAL | 3 refills | Status: DC

## 2021-01-31 NOTE — Progress Notes (Signed)
BP 118/60   Pulse 81   Temp 98.4 F (36.9 C) (Oral)   Ht 5' 9.69" (1.77 m)   Wt 247 lb (112 kg)   SpO2 96%   BMI 35.76 kg/m    Subjective:    Patient ID: Matthew [name], male    DOB: 08/20/1945, 75 y.o.   MRN: [id number]  Chief Complaint  Patient presents with   Diabetes   Hypertension   Hyperlipidemia    HPI: Matthew [name] is a 75 y.o. male  Pt is here for a fu, had a Right knee medial lateral meniscal tear repair Right sided rib # - 3 1/2 weeks ago didn't go to the er as he has had this in the past with football after he got tripped from his dog.      Diabetes He presents for his follow-up (a1c pending, FSBS higher with thanksgivign) diabetic visit. He has type 2 diabetes mellitus. Pertinent negatives for diabetes include no blurred vision.  Hypertension This is a chronic problem. The current episode started more than 1 month ago. Pertinent negatives include no anxiety, blurred vision, malaise/fatigue or peripheral edema.  Hyperlipidemia This is a chronic problem.  Insomnia Primary symptoms: fragmented sleep, sleep disturbance, no somnolence, no frequent awakening, no malaise/fatigue, no napping.     Chief Complaint  Patient presents with   Diabetes   Hypertension   Hyperlipidemia    Relevant past medical, surgical, family and social history reviewed and updated as indicated. Interim medical history since our last visit reviewed. Allergies and medications reviewed and updated.  Review of Systems  Constitutional:  Negative for malaise/fatigue.  Eyes:  Negative for blurred vision.  Psychiatric/Behavioral:  Positive for sleep disturbance. The patient has insomnia.    Per HPI unless specifically indicated above     Objective:    BP 118/60   Pulse 81   Temp 98.4 F (36.9 C) (Oral)   Ht 5' 9.69" (1.77 m)   Wt 247 lb (112 kg)   SpO2 96%   BMI 35.76 kg/m   Wt Readings from Last 3 Encounters:  01/31/21 247 lb (112 kg)  12/23/20 232 lb (105.2 kg)   11/09/20 241 lb 3.2 oz (109.4 kg)    Physical Exam Vitals and nursing note reviewed.  Constitutional:      General: He is not in acute distress.    Appearance: Normal appearance. He is not ill-appearing or diaphoretic.  HENT:     Head: Normocephalic and atraumatic.     Right Ear: Tympanic membrane and external ear normal. There is no impacted cerumen.     Left Ear: External ear normal.     Nose: No congestion or rhinorrhea.     Mouth/Throat:     Pharynx: No oropharyngeal exudate or posterior oropharyngeal erythema.  Eyes:     Conjunctiva/sclera: Conjunctivae normal.     Pupils: Pupils are equal, round, and reactive to light.  Cardiovascular:     Rate and Rhythm: Normal rate and regular rhythm.     Heart sounds: No murmur heard.   No friction rub. No gallop.  Pulmonary:     Effort: No respiratory distress.     Breath sounds: No stridor. No wheezing or rhonchi.  Chest:     Chest wall: No tenderness.  Abdominal:     General: Abdomen is flat. Bowel sounds are normal.     Palpations: Abdomen is soft. There is no mass.     Tenderness: There is no abdominal tenderness.  Musculoskeletal:     Cervical back: Normal range of motion and neck supple. No rigidity or tenderness.     Left lower leg: No edema.  Skin:    General: Skin is warm and dry.  Neurological:     Mental Status: He is alert.    Results for orders placed or performed during the hospital encounter of 12/23/20  Glucose, capillary  Result Value Ref Range   Glucose-Capillary 118 (H) 70 - 99 mg/dL  Glucose, capillary  Result Value Ref Range   Glucose-Capillary 151 (H) 70 - 99 mg/dL        Current Outpatient Medications:    aspirin EC 81 MG tablet, Take 4 tablets (325 mg total) by mouth daily., Disp: 45 tablet, Rfl: 11   atorvastatin (LIPITOR) 20 MG tablet, Take 1 tablet (20 mg total) by mouth daily. (Patient taking differently: Take 20 mg by mouth every morning.), Disp: 100 tablet, Rfl: 0   benazepril (LOTENSIN)  40 MG tablet, Take 1 tablet (40 mg total) by mouth daily. (Patient taking differently: Take 40 mg by mouth every morning.), Disp: 100 tablet, Rfl: 0   docusate sodium (COLACE) 100 MG capsule, Take 1 capsule (100 mg total) by mouth daily as needed., Disp: 30 capsule, Rfl: 2   hydrochlorothiazide (MICROZIDE) 12.5 MG capsule, Take 1 capsule (12.5 mg total) by mouth daily., Disp: 90 capsule, Rfl: 1   insulin detemir (LEVEMIR FLEXTOUCH) 100 UNIT/ML FlexPen, Inject 35 units into the skin at bedtime. (Patient taking differently: Inject 25-30 Units into the skin at bedtime.), Disp: 15 mL, Rfl: 3   Insulin Pen Needle 31G X 8 MM MISC, Use one needle to administer insulin into skin nightly., Disp: 90 each, Rfl: 3   metFORMIN (GLUCOPHAGE) 1000 MG tablet, Take 1 tablet (1,000 mg total) by mouth 2 (two) times daily with a meal., Disp: 180 tablet, Rfl: 0   Multiple Vitamins-Minerals (CENTRUM SILVER 50+MEN) TABS, Take 1 tablet by mouth daily., Disp: , Rfl:    vitamin B-12 (CYANOCOBALAMIN) 500 MCG tablet, Take 500 mcg by mouth daily., Disp: , Rfl:    dapagliflozin propanediol (FARXIGA) 10 MG TABS tablet, Take 1 tablet (10 mg total) by mouth daily before breakfast. (Patient not taking: Reported on 01/31/2021), Disp: 30 tablet, Rfl: 3   HYDROcodone-acetaminophen (NORCO) 5-325 MG tablet, Take 1 tablet by mouth every 4 (four) hours as needed for moderate pain. (Patient not taking: Reported on 01/31/2021), Disp: 40 tablet, Rfl: 0   lidocaine (LIDODERM) 5 %, Place 1 patch onto the skin daily. Remove & Discard patch within 12 hours or as directed by MD (Patient not taking: Reported on 12/09/2020), Disp: 30 patch, Rfl: 1   ondansetron (ZOFRAN) 4 MG tablet, Take 1 tablet (4 mg total) by mouth every 8 (eight) hours as needed for nausea or vomiting. (Patient not taking: Reported on 01/31/2021), Disp: 30 tablet, Rfl: 0    Assessment & Plan:  DM : FSBS - 227  Allergic to GLP's  Is on Levemir 30 untis @ night not taking farxiga   Consider adding short acting insulin .  check HbA1c,  urine  microalbumin  diabetic diet plan given to pt  adviced regarding hypoglycemia and instructions given to pt today on how to prevent and treat the same if it were to occur. pt acknowledges the plan and voices understanding of the same.  exercise plan given and encouraged.   advice diabetic yearly podiatry, ophthalmology , nutritionist , dental check q 6 months  2. Knee pain  Stable. S/p repair Fu and mx per ortho  Problem List Items Addressed This Visit       Endocrine   Type 2 diabetes mellitus with hyperglycemia ([place]) - Primary   Relevant Orders   Bayer DCA Hb A1c Waived   Urinalysis, Routine w reflex microscopic   Microalbumin, urine   CMP14+EGFR   Lipid panel     Orders Placed This Encounter  Procedures   Bayer DCA Hb A1c Waived   Urinalysis, Routine w reflex microscopic   Microalbumin, urine   CMP14+EGFR   Lipid panel     No orders of the defined types were placed in this encounter.    Follow up plan: No follow-ups on file.

## 2021-02-01 LAB — CMP14+EGFR
ALT: 23 IU/L (ref 0–44)
AST: 19 IU/L (ref 0–40)
Albumin/Globulin Ratio: 2.3 — ABNORMAL HIGH (ref 1.2–2.2)
Albumin: 4.5 g/dL (ref 3.7–4.7)
Alkaline Phosphatase: 94 IU/L (ref 44–121)
BUN/Creatinine Ratio: 15 (ref 10–24)
BUN: 18 mg/dL (ref 8–27)
Bilirubin Total: 0.3 mg/dL (ref 0.0–1.2)
CO2: 21 mmol/L (ref 20–29)
Calcium: 9.4 mg/dL (ref 8.6–10.2)
Chloride: 102 mmol/L (ref 96–106)
Creatinine, Ser: 1.19 mg/dL (ref 0.76–1.27)
Globulin, Total: 2 g/dL (ref 1.5–4.5)
Glucose: 178 mg/dL — ABNORMAL HIGH (ref 70–99)
Potassium: 4.5 mmol/L (ref 3.5–5.2)
Sodium: 138 mmol/L (ref 134–144)
Total Protein: 6.5 g/dL (ref 6.0–8.5)
eGFR: 64 mL/min/{1.73_m2} (ref >59)

## 2021-02-01 LAB — CBC WITH DIFFERENTIAL/PLATELET
Basophils Absolute: 0 10*3/uL (ref 0.0–0.2)
Basos: 1 %
EOS (ABSOLUTE): 0.1 10*3/uL (ref 0.0–0.4)
Eos: 2 %
Hematocrit: 37.6 % (ref 37.5–51.0)
Hemoglobin: 12.7 g/dL — ABNORMAL LOW (ref 13.0–17.7)
Immature Grans (Abs): 0 10*3/uL (ref 0.0–0.1)
Immature Granulocytes: 0 %
Lymphocytes Absolute: 1.4 10*3/uL (ref 0.7–3.1)
Lymphs: 22 %
MCH: 29.5 pg (ref 26.6–33.0)
MCHC: 33.8 g/dL (ref 31.5–35.7)
MCV: 87 fL (ref 79–97)
Monocytes Absolute: 0.6 10*3/uL (ref 0.1–0.9)
Monocytes: 9 %
Neutrophils Absolute: 4.3 10*3/uL (ref 1.4–7.0)
Neutrophils: 66 %
Platelets: 272 10*3/uL (ref 150–450)
RBC: 4.31 x10E6/uL (ref 4.14–5.80)
RDW: 13 % (ref 11.6–15.4)
WBC: 6.4 10*3/uL (ref 3.4–10.8)

## 2021-02-01 LAB — LIPID PANEL
Chol/HDL Ratio: 2.9 ratio (ref 0.0–5.0)
Cholesterol, Total: 141 mg/dL (ref 100–199)
HDL: 48 mg/dL (ref >39)
LDL Chol Calc (NIH): 70 mg/dL (ref 0–99)
Triglycerides: 132 mg/dL (ref 0–149)
VLDL Cholesterol Cal: 23 mg/dL (ref 5–40)

## 2021-02-08 ENCOUNTER — Telehealth: Payer: Self-pay

## 2021-02-08 NOTE — Chronic Care Management (AMB) (Signed)
Chronic Care Management Pharmacy Assistant   Name: DANIELE YANKOWSKI  MRN: [id number] DOB: 1945-10-07  Reason for Encounter: Disease State Diabetes Mellitus  Recent office visits:  01/31/21-Avanti Neomia Dear, MD (PCP) General follow up visit. Labs ordered. Follow up in 6 weeks. 11/09/20-Avanti [name], MD (PCP) General follow up visit. Labs ordered. Follow up in 6 weeks. 10/06/20-Avanti [name], MD (PCP) General follow up visit. Xrays ordered. Ambulatory referral to Orthopedic Surgery. Follow up in 6 weeks. 09/21/20-Avanti [name], MD (PCP) General follow up visit. Increase Levemir 25 units daily and then to 30 units gradually over the next 2 weeks. Xray ordered. Labs ordered. Follow up in 2 weeks.  Recent consult visits:  11/17/20-Deitra L. Willias-Toone (Pain Medicine) Notes not available. 11/03/20-Kevin Krasinski (Orthopedic surgery)  11/02/20-Kevin Mack Guise (Orthopedic surgery)  0815/22-Kevin Mack Guise (Orthopedic surgery)   Hospital visits:  None in previous 6 months  Medications: Outpatient Encounter Medications as of 02/08/2021  Medication Sig   aspirin EC 81 MG tablet Take 4 tablets (325 mg total) by mouth daily.   atorvastatin (LIPITOR) 20 MG tablet Take 1 tablet (20 mg total) by mouth daily. (Patient taking differently: Take 20 mg by mouth every morning.)   benazepril (LOTENSIN) 40 MG tablet Take 1 tablet (40 mg total) by mouth daily. (Patient taking differently: Take 40 mg by mouth every morning.)   docusate sodium (COLACE) 100 MG capsule Take 1 capsule (100 mg total) by mouth daily as needed.   empagliflozin (JARDIANCE) 10 MG TABS tablet Take 1 tablet (10 mg total) by mouth daily before breakfast.   hydrochlorothiazide (MICROZIDE) 12.5 MG capsule Take 1 capsule (12.5 mg total) by mouth daily.   HYDROcodone-acetaminophen (NORCO) 5-325 MG tablet Take 1 tablet by mouth every 4 (four) hours as needed for moderate pain. (Patient not taking: Reported on 01/31/2021)   insulin detemir (LEVEMIR  FLEXTOUCH) 100 UNIT/ML FlexPen Inject 35 units into the skin at bedtime. (Patient taking differently: Inject 25-30 Units into the skin at bedtime.)   Insulin Pen Needle 31G X 8 MM MISC Use one needle to administer insulin into skin nightly.   lidocaine (LIDODERM) 5 % Place 1 patch onto the skin daily. Remove & Discard patch within 12 hours or as directed by MD (Patient not taking: Reported on 12/09/2020)   metFORMIN (GLUCOPHAGE) 1000 MG tablet Take 1 tablet (1,000 mg total) by mouth 2 (two) times daily with a meal.   Multiple Vitamins-Minerals (CENTRUM SILVER 50+MEN) TABS Take 1 tablet by mouth daily.   ondansetron (ZOFRAN) 4 MG tablet Take 1 tablet (4 mg total) by mouth every 8 (eight) hours as needed for nausea or vomiting. (Patient not taking: Reported on 01/31/2021)   vitamin B-12 (CYANOCOBALAMIN) 500 MCG tablet Take 500 mcg by mouth daily.   No facility-administered encounter medications on file as of 02/08/2021.   Current antihyperglycemic regimen:  Jariance 10 mg take 1 tab daily Metformin 1000 mg take 1 tab twice daily Levemir inject 35 units at bedtime   Attempted to reach out to the patient to complete assessment call I attempted twice and no answer. Could not leave a voicemail to return phone call.  Adherence Review: Is the patient currently on a STATIN medication? Yes Is the patient currently on ACE/ARB medication? Yes Does the patient have >5 day gap between last estimated fill dates? No   Care Gaps: None noted  Star Rating Drugs: Jardiance 10 mg Last filled:01/31/21 30 DS Benazepril mg Last filled:11/19/20 100 DS Metformin  1000 mg Last filled:11/19/20 90  DS Atorvastatin 20 mg Last filled:11/19/20 100 DS  Corrie Mckusick, [place]

## 2021-02-23 ENCOUNTER — Other Ambulatory Visit: Payer: Self-pay | Admitting: Nurse Practitioner

## 2021-02-23 DIAGNOSIS — E119 Type 2 diabetes mellitus without complications: Secondary | ICD-10-CM

## 2021-02-23 NOTE — Telephone Encounter (Signed)
Requested Prescriptions  Pending Prescriptions Disp Refills   metFORMIN (GLUCOPHAGE) 1000 MG tablet [Pharmacy Med Name: METFORMIN HCL 1,000 MG TABLET] 180 tablet 0    Sig: Take 1 tablet (1,000 mg total) by mouth 2 (two) times daily with a meal.     Endocrinology:  Diabetes - Biguanides Failed - 02/23/2021 11:08 AM      Failed - HBA1C is between 0 and 7.9 and within 180 days    HB A1C (BAYER DCA - WAIVED)  Date Value Ref Range Status  01/31/2021 8.4 (H) 4.8 - 5.6 % Final    Comment:             Prediabetes: 5.7 - 6.4          Diabetes: >6.4          Glycemic control for adults with diabetes: <7.0          Passed - Cr in normal range and within 360 days    Creatinine, Ser  Date Value Ref Range Status  01/31/2021 1.19 0.76 - 1.27 mg/dL Final         Passed - eGFR in normal range and within 360 days    GFR calc Af Amer  Date Value Ref Range Status  09/19/2019 53 (L) >59 mL/min/1.73 Final    Comment:    **[facility] currently reports eGFR in compliance with the current**   recommendations of the [employer]. [facility] will   update reporting as new guidelines are published from the NKF-ASN   Task force.    GFR calc non Af Amer  Date Value Ref Range Status  09/19/2019 46 (L) >59 mL/min/1.73 Final   eGFR  Date Value Ref Range Status  01/31/2021 64 >59 mL/min/1.73 Final         Passed - Valid encounter within last 6 months    Recent Outpatient Visits          3 weeks ago Type 2 diabetes mellitus with hyperglycemia, with long-term current use of insulin ([place])   [facility] [name], Avanti, MD   3 months ago Diabetes mellitus without complication ([place])   [facility] [name], Avanti, MD   4 months ago [place] [name], Avanti, MD   5 months ago [place] [name], Avanti, MD   7 months ago Type 2 diabetes mellitus with hyperglycemia, with long-term current use of insulin ([place])   [place], Barbaraann Faster, NP      Future Appointments            In 2 weeks [name], Avanti, MD [facility]

## 2021-03-07 ENCOUNTER — Other Ambulatory Visit: Payer: Medicare HMO

## 2021-03-09 ENCOUNTER — Telehealth: Payer: Self-pay

## 2021-03-09 ENCOUNTER — Telehealth: Payer: Medicare HMO

## 2021-03-09 NOTE — Telephone Encounter (Signed)
°  Care Management   Follow Up Note   03/09/2021 Name: Matthew [name] MRN: [id number] DOB: 1945-04-04   Referred by: Charlynne Cousins, MD Reason for referral : Chronic Care Management (RNCM: Follow up for Chronic Disease Management and Care Coordination Needs)   An unsuccessful telephone outreach was attempted today. The patient was referred to the case management team for assistance with care management and care coordination.   Follow Up Plan: A HIPPA compliant phone message was left for the patient providing contact information and requesting a return call.   Noreene Larsson RN, MSN, [facility] Mobile: [phone]

## 2021-03-14 ENCOUNTER — Ambulatory Visit: Payer: Medicare HMO | Admitting: Internal Medicine

## 2021-03-17 ENCOUNTER — Other Ambulatory Visit: Payer: Self-pay

## 2021-03-17 ENCOUNTER — Other Ambulatory Visit: Payer: Self-pay | Admitting: Nurse Practitioner

## 2021-03-17 ENCOUNTER — Encounter: Payer: Self-pay | Admitting: Internal Medicine

## 2021-03-17 ENCOUNTER — Ambulatory Visit (INDEPENDENT_AMBULATORY_CARE_PROVIDER_SITE_OTHER): Payer: Medicare HMO | Admitting: Internal Medicine

## 2021-03-17 ENCOUNTER — Inpatient Hospital Stay
Admission: EM | Admit: 2021-03-17 | Discharge: 2021-03-19 | DRG: 872 | Disposition: A | Discharge status: Home / Self Care | Payer: Medicare HMO | Attending: Internal Medicine | Admitting: Internal Medicine

## 2021-03-17 ENCOUNTER — Ambulatory Visit: Payer: Self-pay | Admitting: *Deleted

## 2021-03-17 ENCOUNTER — Emergency Department: Payer: Medicare HMO

## 2021-03-17 ENCOUNTER — Encounter: Payer: Self-pay | Admitting: Emergency Medicine

## 2021-03-17 VITALS — BP 114/79 | HR 160 | Temp 100.2°F | Ht 69.69 in | Wt 241.2 lb

## 2021-03-17 DIAGNOSIS — N136 Pyonephrosis: Secondary | ICD-10-CM | POA: Diagnosis not present

## 2021-03-17 DIAGNOSIS — Z7984 Long term (current) use of oral hypoglycemic drugs: Secondary | ICD-10-CM

## 2021-03-17 DIAGNOSIS — E1165 Type 2 diabetes mellitus with hyperglycemia: Secondary | ICD-10-CM | POA: Diagnosis not present

## 2021-03-17 DIAGNOSIS — R338 Other retention of urine: Secondary | ICD-10-CM | POA: Diagnosis present

## 2021-03-17 DIAGNOSIS — A419 Sepsis, unspecified organism: Principal | ICD-10-CM | POA: Diagnosis present

## 2021-03-17 DIAGNOSIS — R6520 Severe sepsis without septic shock: Secondary | ICD-10-CM | POA: Diagnosis not present

## 2021-03-17 DIAGNOSIS — R197 Diarrhea, unspecified: Secondary | ICD-10-CM | POA: Diagnosis not present

## 2021-03-17 DIAGNOSIS — I471 Supraventricular tachycardia, unspecified: Secondary | ICD-10-CM

## 2021-03-17 DIAGNOSIS — I10 Essential (primary) hypertension: Secondary | ICD-10-CM | POA: Diagnosis present

## 2021-03-17 DIAGNOSIS — R739 Hyperglycemia, unspecified: Secondary | ICD-10-CM

## 2021-03-17 DIAGNOSIS — M545 Low back pain, unspecified: Secondary | ICD-10-CM | POA: Diagnosis not present

## 2021-03-17 DIAGNOSIS — Z7982 Long term (current) use of aspirin: Secondary | ICD-10-CM

## 2021-03-17 DIAGNOSIS — I479 Paroxysmal tachycardia, unspecified: Secondary | ICD-10-CM | POA: Diagnosis not present

## 2021-03-17 DIAGNOSIS — Z8249 Family history of ischemic heart disease and other diseases of the circulatory system: Secondary | ICD-10-CM

## 2021-03-17 DIAGNOSIS — N400 Enlarged prostate without lower urinary tract symptoms: Secondary | ICD-10-CM | POA: Diagnosis not present

## 2021-03-17 DIAGNOSIS — Z79899 Other long term (current) drug therapy: Secondary | ICD-10-CM

## 2021-03-17 DIAGNOSIS — N179 Acute kidney failure, unspecified: Secondary | ICD-10-CM | POA: Diagnosis not present

## 2021-03-17 DIAGNOSIS — I493 Ventricular premature depolarization: Secondary | ICD-10-CM | POA: Diagnosis present

## 2021-03-17 DIAGNOSIS — E785 Hyperlipidemia, unspecified: Secondary | ICD-10-CM | POA: Diagnosis not present

## 2021-03-17 DIAGNOSIS — E861 Hypovolemia: Secondary | ICD-10-CM | POA: Diagnosis not present

## 2021-03-17 DIAGNOSIS — N135 Crossing vessel and stricture of ureter without hydronephrosis: Secondary | ICD-10-CM | POA: Diagnosis not present

## 2021-03-17 DIAGNOSIS — Z794 Long term (current) use of insulin: Secondary | ICD-10-CM | POA: Diagnosis not present

## 2021-03-17 DIAGNOSIS — N1330 Unspecified hydronephrosis: Secondary | ICD-10-CM | POA: Diagnosis present

## 2021-03-17 DIAGNOSIS — N390 Urinary tract infection, site not specified: Secondary | ICD-10-CM | POA: Diagnosis not present

## 2021-03-17 DIAGNOSIS — R109 Unspecified abdominal pain: Secondary | ICD-10-CM | POA: Diagnosis not present

## 2021-03-17 DIAGNOSIS — R509 Fever, unspecified: Secondary | ICD-10-CM | POA: Diagnosis not present

## 2021-03-17 DIAGNOSIS — N323 Diverticulum of bladder: Secondary | ICD-10-CM | POA: Diagnosis not present

## 2021-03-17 DIAGNOSIS — N401 Enlarged prostate with lower urinary tract symptoms: Secondary | ICD-10-CM | POA: Diagnosis not present

## 2021-03-17 DIAGNOSIS — R000 Tachycardia, unspecified: Secondary | ICD-10-CM | POA: Diagnosis not present

## 2021-03-17 DIAGNOSIS — Z20822 Contact with and (suspected) exposure to covid-19: Secondary | ICD-10-CM | POA: Diagnosis present

## 2021-03-17 DIAGNOSIS — I152 Hypertension secondary to endocrine disorders: Secondary | ICD-10-CM

## 2021-03-17 DIAGNOSIS — Q628 Other congenital malformations of ureter: Secondary | ICD-10-CM

## 2021-03-17 DIAGNOSIS — K8020 Calculus of gallbladder without cholecystitis without obstruction: Secondary | ICD-10-CM | POA: Diagnosis not present

## 2021-03-17 DIAGNOSIS — Z833 Family history of diabetes mellitus: Secondary | ICD-10-CM | POA: Diagnosis not present

## 2021-03-17 DIAGNOSIS — N3090 Cystitis, unspecified without hematuria: Secondary | ICD-10-CM

## 2021-03-17 DIAGNOSIS — E7800 Pure hypercholesterolemia, unspecified: Secondary | ICD-10-CM

## 2021-03-17 DIAGNOSIS — Z743 Need for continuous supervision: Secondary | ICD-10-CM | POA: Diagnosis not present

## 2021-03-17 HISTORY — DX: Supraventricular tachycardia: I47.1

## 2021-03-17 HISTORY — DX: Supraventricular tachycardia, unspecified: I47.10

## 2021-03-17 LAB — URINALYSIS, ROUTINE W REFLEX MICROSCOPIC
Bacteria, UA: NONE SEEN
Bilirubin Urine: NEGATIVE
Glucose, UA: >=500 mg/dL — AB
Ketones, ur: 5 mg/dL — AB
Nitrite: POSITIVE — AB
Protein, ur: NEGATIVE mg/dL
Specific Gravity, Urine: 1.026 (ref 1.005–1.030)
pH: 5 (ref 5.0–8.0)

## 2021-03-17 LAB — COMPREHENSIVE METABOLIC PANEL
ALT: 24 U/L (ref 0–44)
AST: 18 U/L (ref 15–41)
Albumin: 3.4 g/dL — ABNORMAL LOW (ref 3.5–5.0)
Alkaline Phosphatase: 67 U/L (ref 38–126)
Anion gap: 14 (ref 5–15)
BUN: 26 mg/dL — ABNORMAL HIGH (ref 8–23)
CO2: 20 mmol/L — ABNORMAL LOW (ref 22–32)
Calcium: 8.3 mg/dL — ABNORMAL LOW (ref 8.9–10.3)
Chloride: 97 mmol/L — ABNORMAL LOW (ref 98–111)
Creatinine, Ser: 1.7 mg/dL — ABNORMAL HIGH (ref 0.61–1.24)
GFR, Estimated: 42 mL/min — ABNORMAL LOW (ref >60)
Glucose, Bld: 214 mg/dL — ABNORMAL HIGH (ref 70–99)
Potassium: 3.6 mmol/L (ref 3.5–5.1)
Sodium: 131 mmol/L — ABNORMAL LOW (ref 135–145)
Total Bilirubin: 1 mg/dL (ref 0.3–1.2)
Total Protein: 7.1 g/dL (ref 6.5–8.1)

## 2021-03-17 LAB — CBC WITH DIFFERENTIAL/PLATELET
Abs Immature Granulocytes: 0.18 10*3/uL — ABNORMAL HIGH (ref 0.00–0.07)
Basophils Absolute: 0 10*3/uL (ref 0.0–0.1)
Basophils Relative: 0 %
Eosinophils Absolute: 0 10*3/uL (ref 0.0–0.5)
Eosinophils Relative: 0 %
HCT: 37.4 % — ABNORMAL LOW (ref 39.0–52.0)
Hemoglobin: 13.2 g/dL (ref 13.0–17.0)
Immature Granulocytes: 1 %
Lymphocytes Relative: 4 %
Lymphs Abs: 0.9 10*3/uL (ref 0.7–4.0)
MCH: 30.4 pg (ref 26.0–34.0)
MCHC: 35.3 g/dL (ref 30.0–36.0)
MCV: 86.2 fL (ref 80.0–100.0)
Monocytes Absolute: 2.3 10*3/uL — ABNORMAL HIGH (ref 0.1–1.0)
Monocytes Relative: 12 %
Neutro Abs: 16.8 10*3/uL — ABNORMAL HIGH (ref 1.7–7.7)
Neutrophils Relative %: 83 %
Platelets: 245 10*3/uL (ref 150–400)
RBC: 4.34 MIL/uL (ref 4.22–5.81)
RDW: 13 % (ref 11.5–15.5)
WBC: 20.2 10*3/uL — ABNORMAL HIGH (ref 4.0–10.5)
nRBC: 0 % (ref 0.0–0.2)

## 2021-03-17 LAB — LACTIC ACID, PLASMA: Lactic Acid, Venous: 1 mmol/L (ref 0.5–1.9)

## 2021-03-17 LAB — GLUCOSE, CAPILLARY
Glucose-Capillary: 188 mg/dL — ABNORMAL HIGH (ref 70–99)
Glucose-Capillary: 213 mg/dL — ABNORMAL HIGH (ref 70–99)

## 2021-03-17 LAB — LIPASE, BLOOD: Lipase: 44 U/L (ref 11–51)

## 2021-03-17 LAB — RESP PANEL BY RT-PCR (FLU A&B, COVID) ARPGX2
Influenza A by PCR: NEGATIVE
Influenza B by PCR: NEGATIVE
SARS Coronavirus 2 by RT PCR: NEGATIVE

## 2021-03-17 LAB — T4, FREE: Free T4: 1.03 ng/dL (ref 0.61–1.12)

## 2021-03-17 LAB — ETHANOL: Alcohol, Ethyl (B): <10 mg/dL (ref <10)

## 2021-03-17 LAB — TSH: TSH: 1.49 u[IU]/mL (ref 0.350–4.500)

## 2021-03-17 LAB — MAGNESIUM: Magnesium: 1.7 mg/dL (ref 1.7–2.4)

## 2021-03-17 LAB — TROPONIN I (HIGH SENSITIVITY)
Troponin I (High Sensitivity): 14 ng/L (ref <18)
Troponin I (High Sensitivity): 15 ng/L (ref <18)

## 2021-03-17 LAB — D-DIMER, QUANTITATIVE: D-Dimer, Quant: 1.54 ug/mL-FEU — ABNORMAL HIGH (ref 0.00–0.50)

## 2021-03-17 MED ORDER — SODIUM CHLORIDE 0.9 % IV BOLUS (SEPSIS)
500.0000 mL | Freq: Once | INTRAVENOUS | Status: AC
  Administered 2021-03-17: 500 mL via INTRAVENOUS

## 2021-03-17 MED ORDER — INSULIN ASPART 100 UNIT/ML IJ SOLN
0.0000 [IU] | INTRAMUSCULAR | Status: DC
  Administered 2021-03-18: 2 [IU] via SUBCUTANEOUS
  Administered 2021-03-18: 5 [IU] via SUBCUTANEOUS
  Administered 2021-03-18: 15 [IU] via SUBCUTANEOUS
  Administered 2021-03-18: 5 [IU] via SUBCUTANEOUS
  Administered 2021-03-19: 2 [IU] via SUBCUTANEOUS
  Filled 2021-03-17 (×6): qty 1

## 2021-03-17 MED ORDER — ACETAMINOPHEN 325 MG PO TABS
650.0000 mg | ORAL_TABLET | Freq: Four times a day (QID) | ORAL | Status: DC | PRN
  Administered 2021-03-18: 650 mg via ORAL
  Filled 2021-03-17: qty 2

## 2021-03-17 MED ORDER — MORPHINE SULFATE (PF) 2 MG/ML IV SOLN: 2.0000 mg | INTRAVENOUS | Status: DC | PRN

## 2021-03-17 MED ORDER — METOPROLOL TARTRATE 5 MG/5ML IV SOLN
5.0000 mg | Freq: Once | INTRAVENOUS | Status: AC
  Administered 2021-03-17: 5 mg via INTRAVENOUS

## 2021-03-17 MED ORDER — INSULIN DETEMIR 100 UNIT/ML ~~LOC~~ SOLN
10.0000 [IU] | Freq: Every day | SUBCUTANEOUS | Status: DC
  Administered 2021-03-17 – 2021-03-18 (×2): 10 [IU] via SUBCUTANEOUS
  Filled 2021-03-17 (×3): qty 0.1

## 2021-03-17 MED ORDER — METOPROLOL TARTRATE 5 MG/5ML IV SOLN
INTRAVENOUS | Status: AC
  Filled 2021-03-17: qty 5

## 2021-03-17 MED ORDER — ONDANSETRON HCL 4 MG/2ML IJ SOLN
4.0000 mg | Freq: Once | INTRAMUSCULAR | Status: AC
  Administered 2021-03-17: 4 mg via INTRAVENOUS
  Filled 2021-03-17: qty 2

## 2021-03-17 MED ORDER — ACETAMINOPHEN 650 MG RE SUPP: 650.0000 mg | Freq: Four times a day (QID) | RECTAL | Status: DC | PRN

## 2021-03-17 MED ORDER — IOHEXOL 300 MG/ML  SOLN
75.0000 mL | Freq: Once | INTRAMUSCULAR | Status: AC | PRN
  Administered 2021-03-17: 75 mL via INTRAVENOUS

## 2021-03-17 MED ORDER — HYDROCODONE-ACETAMINOPHEN 5-325 MG PO TABS: 1.0000 | ORAL_TABLET | ORAL | Status: DC | PRN

## 2021-03-17 MED ORDER — ONDANSETRON HCL 4 MG PO TABS: 4.0000 mg | ORAL_TABLET | Freq: Four times a day (QID) | ORAL | Status: DC | PRN

## 2021-03-17 MED ORDER — ONDANSETRON HCL 4 MG/2ML IJ SOLN: 4.0000 mg | Freq: Four times a day (QID) | INTRAMUSCULAR | Status: DC | PRN

## 2021-03-17 MED ORDER — LACTATED RINGERS IV SOLN: INTRAVENOUS | Status: DC

## 2021-03-17 MED ORDER — METOPROLOL TARTRATE 5 MG/5ML IV SOLN: 5.0000 mg | Freq: Once | INTRAVENOUS | Status: DC

## 2021-03-17 MED ORDER — MORPHINE SULFATE (PF) 4 MG/ML IV SOLN
4.0000 mg | Freq: Once | INTRAVENOUS | Status: AC
  Administered 2021-03-17: 4 mg via INTRAVENOUS
  Filled 2021-03-17: qty 1

## 2021-03-17 MED ORDER — SODIUM CHLORIDE 0.9 % IV SOLN
2.0000 g | INTRAVENOUS | Status: DC
  Administered 2021-03-18: 2 g via INTRAVENOUS
  Filled 2021-03-17 (×2): qty 20

## 2021-03-17 MED ORDER — MAGNESIUM OXIDE -MG SUPPLEMENT 400 (240 MG) MG PO TABS
800.0000 mg | ORAL_TABLET | Freq: Once | ORAL | Status: AC
  Administered 2021-03-17: 800 mg via ORAL
  Filled 2021-03-17: qty 2

## 2021-03-17 MED ORDER — MAGNESIUM SULFATE 2 GM/50ML IV SOLN
2.0000 g | Freq: Once | INTRAVENOUS | Status: AC
  Administered 2021-03-17: 2 g via INTRAVENOUS
  Filled 2021-03-17: qty 50

## 2021-03-17 MED ORDER — SODIUM CHLORIDE 0.9 % IV BOLUS
1000.0000 mL | Freq: Once | INTRAVENOUS | Status: AC
  Administered 2021-03-17: 1000 mL via INTRAVENOUS

## 2021-03-17 MED ORDER — LIDOCAINE HCL URETHRAL/MUCOSAL 2 % EX GEL
1.0000 "application " | Freq: Once | CUTANEOUS | Status: AC
  Administered 2021-03-17: 1 via URETHRAL
  Filled 2021-03-17 (×2): qty 5

## 2021-03-17 MED ORDER — POTASSIUM CHLORIDE CRYS ER 20 MEQ PO TBCR
40.0000 meq | EXTENDED_RELEASE_TABLET | Freq: Once | ORAL | Status: AC
  Administered 2021-03-17: 40 meq via ORAL
  Filled 2021-03-17: qty 2

## 2021-03-17 MED ORDER — SODIUM CHLORIDE 0.9 % IV SOLN: INTRAVENOUS | Status: DC

## 2021-03-17 MED ORDER — CEFTRIAXONE SODIUM 2 G IJ SOLR
2.0000 g | Freq: Once | INTRAMUSCULAR | Status: AC
Start: 2021-03-17 — End: 2021-03-17
  Administered 2021-03-17: 2 g via INTRAVENOUS
  Filled 2021-03-17: qty 20

## 2021-03-17 NOTE — Telephone Encounter (Signed)
° °  Chief Complaint: worsening back pain requesting earlier appt  Symptoms: back pain, frequent urination, elevated blood sugars  Frequency: since yesterday  Pertinent Negatives: Patient denies na Disposition: [] ED /[] Urgent Care (no appt availability in office) / [x] Appointment(In office/virtual)/ []  [facility]/ [] Home Care/ [] Refused Recommended Disposition /[] [place] [facility]/ []  Follow-up with PCP Additional Notes:  Hx stage III kidney disease     Reason for Disposition  Side (flank) or lower back pain present  Answer Assessment - Initial Assessment Questions 1. SYMPTOM: "What's the main symptom you're concerned about?" (e.g., frequency, incontinence)     Frequency urination 2. ONSET: "When did the  pain   start?"     Yesterday  3. PAIN: "Is there any pain?" If Yes, ask: "How bad is it?" (Scale: 1-10; mild, moderate, severe)     Severe  4. CAUSE: "What do you think is causing the symptoms?"     Not sure.  5. OTHER SYMPTOMS: "Do you have any other symptoms?" (e.g., fever, flank pain, blood in urine, pain with urination)     Back pain, frequent urination 6. PREGNANCY: "Is there any chance you are pregnant?" "When was your last menstrual period?"     na  Protocols used: Urinary Symptoms-A-AH

## 2021-03-17 NOTE — Progress Notes (Signed)
Rapid Response Event Note   Reason for Call : SVT  Initial Focused Assessment: On my arrival pt is alert and oriented and is in no distress. Pt denies chest pain. HR 150s, all other VSS on room air. Primary RN states pt went into SVT in ED and converted back to NSR without any treatment.  Interventions: EKG ordered per rapid order set. Sharion Settler, NP notified of rapid response called. 5 of metoprolol X2 and 559ml bolus ordered per Hassan Rowan, NP and administered. BP remains stable. Pt's HR down to 130/140s at this time. Order also received for coude insertion d/t enlarged prostate.  Plan of Care: Pt remains on 2A at this time. This RN will return to insert coude.   Event Summary:   MD Notified: Sharion Settler, NP Call Time: 2157 Arrival Time: 2159 End Time: [place], RN

## 2021-03-17 NOTE — H&P (Signed)
History and Physical    Matthew [name] ZDG:[id number] DOB: 20-Jun-1945 DOA: 03/17/2021  PCP: Charlynne Cousins, MD   Patient coming from: home  I have personally briefly reviewed patient's relevant medical records in [place]  Chief Complaint: flank pain and fever x 2 days, rapid heart rate at PCP  HPI: Matthew [name] is a 76 y.o. male with medical history significant for DM, HTN, BPH, HLD who presents to the ED with a 2-day history of bilateral flank pain and fever.  Pain is 8 out of 10, radiating to lower abdomen with no aggravating or alleviating factors.  He has no vomiting, diarrhea or constipation and no dysuria.  He denies cough or shortness of breath and denies chest pain.  He went to see his PCP who found him to be in SVT with a rate in the 160s.  EMS attempted vagal maneuvers without improvement and patient was brought to the ED.  ED course: T-max 100.2 with heart rate 109, BP 95/62 and otherwise normal vitals Blood work: WBC 20,000, lactic acid pending Sodium 131, glucose 214, creatinine 1.70 up from baseline of 1.19 LFTs and lipase WNL Troponin 14-15 Urinalysis with positive nitrite and moderate leukocyte esterase COVID and flu negative  EKG, personally viewed and interpreted:( spontaneous conversion from SVT to sinus tach in the ED) 1.:  EKG #1 SVT at 148 2: EKG #2 sinus tachycardia at 111 with multiple PVCs  CT abdomen and pelvis: Moderate left hydronephrosis and hydroureter Atypical lateral incision of ureter No obstructing lesion Bladder diverticula Prostate hypertrophy.  Please see complete report Chest x-ray with no active disease  Patient started on an IV fluid bolus and Rocephin The ED provider spoke with urologist, Dr. Caprice Beaver who opined that hydronephrosis could be related to incomplete bladder emptying.  He recommended Foley catheter, n.p.o., renal ultrasound in a.m. and possible stent if no improvement in hydro with Foley  Hospitalist consulted for  admission.  Patient spontaneously converted   Review of Systems: As per HPI otherwise all other systems on review of systems negative.   Assessment/Plan    Severe sepsis (HCC)   Complicated UTI (urinary tract infection) - Sepsis fluids - IV Rocephin - Follow cultures    AKI (acute kidney injury) ([place]) - Secondary to sepsis - IV hydration - Monitor renal function and avoid nephrotoxins and renally dose all meds    Hydronephrosis and hydroureter of left kidney   Congenital anomaly of ureter   Urinary retention secondary to BPH - Continue Foley catheter - Renal ultrasound ordered for the a.m. - Urology consulted from the ED - N.p.o. for possible ureteral stent placement in the a.m.  --Per Urology :"....could be reflux up the ectopic left ureter from incomplete bladder emptying-bladder full on CT and multiple diverticula. Recommend foley and repeat renal US in AM. NPO at midnight and will consider attempt at stent tomorrow if no improvement in hydro with foley"    Paroxysmal SVT (supraventricular tachycardia) (HCC)   Frequent PVCs - Converted spontaneously to sinus tach with PVCs while in the ED - Supplement potassium to above 4 and magnesium to above 2 - Start metoprolol to replace HCTZ for BP control if BP tolerates -IV hydration - Continuous cardiac monitoring    Type 2 diabetes mellitus with hyperglycemia (HCC) - Sliding scale insulin coverage    HTN (hypertension) - Hold home benazepril and HCTZ due to soft BP on admission - Metoprolol started due to frequent PVCs   DVT prophylaxis: SCDs due  to surgical procedure in the a.m. Code Status: full code  Family Communication:  wife at bedside Disposition Plan: Back to previous home environment Consults called: Urology Status:At the time of admission, it appears that the appropriate admission status for this patient is INPATIENT. This is judged to be reasonable and necessary in order to provide the required intensity of  service to ensure the patient's safety given the presenting symptoms, physical exam findings, and initial radiographic and laboratory data in the context of their  Comorbid conditions.   Patient requires inpatient status due to high intensity of service, high risk for further deterioration and high frequency of surveillance required.   I certify that at the point of admission it is my clinical judgment that the patient will require inpatient hospital care spanning beyond 2 midnights     Physical Exam: Vitals:   03/17/21 1830 03/17/21 1900 03/17/21 1930 03/17/21 2030  BP: 95/61 107/72 99/69 117/72  Pulse: 92 90 90 96  Resp: (!) 25 (!) 23 19 19   Temp:      TempSrc:      SpO2: 98% 96% 94% 93%  Weight:      Height:       Constitutional: Alert, oriented x 3 . Not in any apparent distress HEENT:      Head: Normocephalic and atraumatic.         Eyes: PERLA, EOMI, Conjunctivae are normal. Sclera is non-icteric.       Mouth/Throat: Mucous membranes are moist.       Neck: Supple with no signs of meningismus. Cardiovascular: Regular rate and rhythm. No murmurs, gallops, or rubs. 2+ symmetrical distal pulses are present . No JVD. No  LE edema Respiratory: Respiratory effort normal .Lungs sounds clear bilaterally. No wheezes, crackles, or rhonchi.  Gastrointestinal: Soft, non tender, non distended. Positive bowel sounds.  Genitourinary: Foley draining clear urine Musculoskeletal: Nontender with normal range of motion in all extremities. No cyanosis, or erythema of extremities. Neurologic:  Face is symmetric. Moving all extremities. No gross focal neurologic deficits . Skin: Skin is warm, dry.  No rash or ulcers Psychiatric: Mood and affect are appropriate     Past Medical History:  Diagnosis Date   Arthritis    bil knees and hips and back   Asthma    as a child   Hyperlipidemia    Hypertension    Sleep apnea    does not use cpap since losing weight   Type 2 diabetes mellitus ([place])      Past Surgical History:  Procedure Laterality Date   COLONOSCOPY     HERNIA REPAIR     KNEE ARTHROSCOPY Right 12/23/2020   Procedure: RIGHT ARTHROSCOPY KNEE PARTIAL MEDIAL AND LATERAL MENISCECTOMY;  Surgeon: Thornton Park, MD;  Location: [facility] ORS;  Service: Orthopedics;  Laterality: Right;     reports that he has never smoked. He has never used smokeless tobacco. He reports that he does not drink alcohol and does not use drugs.  Allergies  Allergen Reactions   Ozempic (0.25 Or 0.5 Mg-Dose) [Semaglutide(0.25 Or 0.5mg -Dos)] Swelling    Family History  Problem Relation Age of Onset   Diabetes Mother    Hypertension Father    Diabetes Sister    Diabetes Brother    Diabetes Maternal Grandmother       Prior to Admission medications   Medication Sig Start Date End Date Taking? Authorizing Provider  aspirin EC 81 MG tablet Take 4 tablets (325 mg total) by mouth daily. 12/23/20  Thornton Park, MD  atorvastatin (LIPITOR) 20 MG tablet Take 1 tablet (20 mg total) by mouth daily. 03/17/21   [name], Avanti, MD  benazepril (LOTENSIN) 40 MG tablet Take 1 tablet (40 mg total) by mouth daily. 03/17/21   [name], Avanti, MD  docusate sodium (COLACE) 100 MG capsule Take 1 capsule (100 mg total) by mouth daily as needed. 12/23/20 12/23/21  Thornton Park, MD  empagliflozin (JARDIANCE) 10 MG TABS tablet Take 1 tablet (10 mg total) by mouth daily before breakfast. 01/31/21   [name], Avanti, MD  hydrochlorothiazide (MICROZIDE) 12.5 MG capsule Take 1 capsule (12.5 mg total) by mouth daily. 09/19/19   Volney American, PA-C  HYDROcodone-acetaminophen (NORCO) 5-325 MG tablet Take 1 tablet by mouth every 4 (four) hours as needed for moderate pain. Patient not taking: Reported on 01/31/2021 12/23/20   Thornton Park, MD  insulin detemir (LEVEMIR FLEXTOUCH) 100 UNIT/ML FlexPen Inject 35 units into the skin at bedtime. Patient taking differently: Inject 25-30 Units into the skin at bedtime. 10/06/20    [name], Avanti, MD  Insulin Pen Needle 31G X 8 MM MISC Use one needle to administer insulin into skin nightly. 03/19/19   Cannady, Henrine Screws T, NP  lidocaine (LIDODERM) 5 % Place 1 patch onto the skin daily. Remove & Discard patch within 12 hours or as directed by MD 09/21/20   Charlynne Cousins, MD  metFORMIN (GLUCOPHAGE) 1000 MG tablet Take 1 tablet (1,000 mg total) by mouth 2 (two) times daily with a meal. 02/23/21   [name], Avanti, MD  Multiple Vitamins-Minerals (CENTRUM SILVER 50+MEN) TABS Take 1 tablet by mouth daily.    [provider]  ondansetron (ZOFRAN) 4 MG tablet Take 1 tablet (4 mg total) by mouth every 8 (eight) hours as needed for nausea or vomiting. Patient not taking: Reported on 01/31/2021 12/23/20   Thornton Park, MD  vitamin B-12 (CYANOCOBALAMIN) 500 MCG tablet Take 500 mcg by mouth daily.    [provider]      Labs on Admission: I have personally reviewed following labs and imaging studies  CBC: Recent Labs  Lab 03/17/21 1613  WBC 20.2*  NEUTROABS 16.8*  HGB 13.2  HCT 37.4*  MCV 86.2  PLT 884   Basic Metabolic Panel: Recent Labs  Lab 03/17/21 1613  NA 131*  K 3.6  CL 97*  CO2 20*  GLUCOSE 214*  BUN 26*  CREATININE 1.70*  CALCIUM 8.3*  MG 1.7   GFR: Estimated Creatinine Clearance: 47.1 mL/min (A) (by C-G formula based on SCr of 1.7 mg/dL (H)). Liver Function Tests: Recent Labs  Lab 03/17/21 1613  AST 18  ALT 24  ALKPHOS 67  BILITOT 1.0  PROT 7.1  ALBUMIN 3.4*   Recent Labs  Lab 03/17/21 1613  LIPASE 44   No results for input(s): AMMONIA in the last 168 hours. Coagulation Profile: No results for input(s): INR, PROTIME in the last 168 hours. Cardiac Enzymes: No results for input(s): CKTOTAL, CKMB, CKMBINDEX, TROPONINI in the last 168 hours. BNP (last 3 results) No results for input(s): PROBNP in the last 8760 hours. HbA1C: No results for input(s): HGBA1C in the last 72 hours. CBG: No results for input(s): GLUCAP in the last  168 hours. Lipid Profile: No results for input(s): CHOL, HDL, LDLCALC, TRIG, CHOLHDL, LDLDIRECT in the last 72 hours. Thyroid Function Tests: Recent Labs    03/17/21 1613  TSH 1.490  FREET4 1.03   Anemia Panel: No results for input(s): VITAMINB12, FOLATE, FERRITIN, TIBC, IRON, RETICCTPCT in  the last 72 hours. Urine analysis:    Component Value Date/Time   COLORURINE YELLOW (A) 03/17/2021 1844   APPEARANCEUR HAZY (A) 03/17/2021 1844   APPEARANCEUR Clear 01/31/2021 1127   LABSPEC 1.026 03/17/2021 1844   PHURINE 5.0 03/17/2021 1844   GLUCOSEU >=500 (A) 03/17/2021 1844   HGBUR MODERATE (A) 03/17/2021 1844   BILIRUBINUR NEGATIVE 03/17/2021 1844   BILIRUBINUR Negative 01/31/2021 1127   KETONESUR 5 (A) 03/17/2021 1844   PROTEINUR NEGATIVE 03/17/2021 1844   NITRITE POSITIVE (A) 03/17/2021 1844   LEUKOCYTESUR MODERATE (A) 03/17/2021 1844    Radiological Exams on Admission: CT ABDOMEN PELVIS W CONTRAST  Result Date: 03/17/2021 CLINICAL DATA:  Abdominal pain, EXAM: CT ABDOMEN AND PELVIS WITH CONTRAST TECHNIQUE: Multidetector CT imaging of the abdomen and pelvis was performed using the standard protocol following bolus administration of intravenous contrast. RADIATION DOSE REDUCTION: This exam was performed according to the departmental dose-optimization program which includes automated exposure control, adjustment of the mA and/or kV according to patient size and/or use of iterative reconstruction technique. CONTRAST:  27mL OMNIPAQUE IOHEXOL 300 MG/ML  SOLN COMPARISON:  None. FINDINGS: Lower chest: Lung bases are clear. Hepatobiliary: Gallstones present without acute cholecystitis. No biliary duct dilatation. Pancreas: Pancreas is normal. No ductal dilatation. No pancreatic inflammation. Spleen: Normal spleen Adrenals/urinary tract: Adrenal glands normal. Hydronephrosis and hydroureter of the LEFT renal collecting system. The LEFT ureter is ectatic. Hydroureter extends into the pelvis. Atypical  insertion of the LEFT ureter in the LEFT lateral aspect of the bladder (image 74/2). No obstructing lesion identified. No excretion of contrast on delayed imaging (series 7). RIGHT kidney normal.  No hydronephrosis or hydroureter. Several diverticula of the bladder.  No Asymmetric soft tissue at the base the bladder is favored prostate tissue (image 81/2. Stomach/Bowel: Stomach, small bowel, appendix, and cecum are normal. The colon and rectosigmoid colon are normal. Vascular/Lymphatic: Abdominal aorta is normal caliber with atherosclerotic calcification. There is no retroperitoneal or periportal lymphadenopathy. No pelvic lymphadenopathy. Reproductive: Prostate enlarged. Other: No free fluid. Musculoskeletal: No aggressive osseous lesion. IMPRESSION: 1. Moderate LEFT hydronephrosis and hydroureter. No excretion of contrast on delayed imaging. LEFT hydroureter extends to the level of the bladder. Atypical lateral insertion site of the ureter/vesicoureteral junction. No obstructing lesion identified. 2. Several small bladder diverticula. 3. Prostate hypertrophy. 4. RIGHT kidney normal. 5. Cholelithiasis without cholecystitis. Electronically Signed   By: Suzy Bouchard M.D.   On: 03/17/2021 18:16   DG Chest Portable 1 View  Result Date: 03/17/2021 CLINICAL DATA:  Tachycardia, fever for the last 2 days EXAM: PORTABLE CHEST 1 VIEW COMPARISON:  03/17/2021 FINDINGS: The heart size and mediastinal contours are within normal limits. Both lungs are clear. The visualized skeletal structures are unremarkable. IMPRESSION: No active disease. Electronically Signed   By: Kathreen Devoid M.D.   On: 03/17/2021 16:23       Athena Masse MD [facility]   03/17/2021, 9:16 PM

## 2021-03-17 NOTE — Progress Notes (Signed)
BP 114/79    Pulse (!) 160    Temp 100.2 F (37.9 C) (Oral)    Ht 5' 9.69" (1.77 m)    Wt 241 lb 3.2 oz (109.4 kg)    SpO2 94%    BMI 34.92 kg/m    Subjective:    Patient ID: Matthew [name], male    DOB: Apr 23, 1945, 76 y.o.   MRN: [id number]  Chief Complaint  Patient presents with   Back pain    Lower back pain, Patient feels as if he is being punched in his kidneys, started yesterday morning.   Diabetes    Patient states that he has been unable to get he blood sugar below 209 started yesterday    HPI: Matthew [name] is a 76 y.o. male  Pt is here for lower back pain started yesterday , fever started yesterday as well of 51 F - has had to take more insulin 217 -- 230 highest bl sugar   and after taking extra insulin. Pt has levemir 35 units last night - and took extra 25 units this morning. Doesn't feel sick/ nauseous.  Has back pain in the kidney area.   HR - 160 normal Rate on exam. No SOB/ NO CP/  Fever 100.2 F now 19 F yesterday. No URI / diarrhea / Home COVID - ve at home.      Chief Complaint  Patient presents with   Back pain    Lower back pain, Patient feels as if he is being punched in his kidneys, started yesterday morning.   Diabetes    Patient states that he has been unable to get he blood sugar below 209 started yesterday    Relevant past medical, surgical, family and social history reviewed and updated as indicated. Interim medical history since our last visit reviewed. Allergies and medications reviewed and updated.  Review of Systems  Per HPI unless specifically indicated above     Objective:    BP 114/79    Pulse (!) 160    Temp 100.2 F (37.9 C) (Oral)    Ht 5' 9.69" (1.77 m)    Wt 241 lb 3.2 oz (109.4 kg)    SpO2 94%    BMI 34.92 kg/m   Wt Readings from Last 3 Encounters:  03/17/21 240 lb (108.9 kg)  03/17/21 241 lb 3.2 oz (109.4 kg)  01/31/21 247 lb (112 kg)    Physical Exam Vitals and nursing note reviewed.  Constitutional:       General: He is not in acute distress.    Appearance: Normal appearance. He is not ill-appearing or diaphoretic.  HENT:     Head: Normocephalic and atraumatic.     Right Ear: Tympanic membrane and external ear normal. There is no impacted cerumen.     Left Ear: External ear normal.     Nose: No congestion or rhinorrhea.     Mouth/Throat:     Pharynx: No oropharyngeal exudate or posterior oropharyngeal erythema.  Eyes:     Conjunctiva/sclera: Conjunctivae normal.     Pupils: Pupils are equal, round, and reactive to light.  Cardiovascular:     Rate and Rhythm: Regular rhythm. Tachycardia present.     Heart sounds: No murmur heard.   No friction rub. No gallop.  Pulmonary:     Effort: No respiratory distress.     Breath sounds: No stridor. No wheezing or rhonchi.  Chest:     Chest wall: No tenderness.  Abdominal:  General: Bowel sounds are normal.     Palpations: There is no mass.     Tenderness: There is abdominal tenderness. There is right CVA tenderness and left CVA tenderness.     Comments: Bil flank pain   Musculoskeletal:     Cervical back: Normal range of motion and neck supple. No rigidity or tenderness.     Left lower leg: No edema.  Skin:    General: Skin is warm and dry.  Neurological:     Mental Status: He is alert.    Results for orders placed or performed in visit on 01/31/21  Bayer DCA Hb A1c Waived  Result Value Ref Range   HB A1C (BAYER DCA - WAIVED) 8.4 (H) 4.8 - 5.6 %  Urinalysis, Routine w reflex microscopic  Result Value Ref Range   Specific Gravity, UA 1.025 1.005 - 1.030   pH, UA 5.5 5.0 - 7.5   Color, UA Yellow Yellow   Appearance Ur Clear Clear   Leukocytes,UA Negative Negative   Protein,UA Negative Negative/Trace   Glucose, UA 1+ (A) Negative   Ketones, UA Negative Negative   RBC, UA Negative Negative   Bilirubin, UA Negative Negative   Urobilinogen, Ur 0.2 0.2 - 1.0 mg/dL   Nitrite, UA Negative Negative  CBC with Differential/Platelet   Result Value Ref Range   WBC 6.4 3.4 - 10.8 x10E3/uL   RBC 4.31 4.14 - 5.80 x10E6/uL   Hemoglobin 12.7 (L) 13.0 - 17.7 g/dL   Hematocrit 37.6 37.5 - 51.0 %   MCV 87 79 - 97 fL   MCH 29.5 26.6 - 33.0 pg   MCHC 33.8 31.5 - 35.7 g/dL   RDW 13.0 11.6 - 15.4 %   Platelets 272 150 - 450 x10E3/uL   Neutrophils 66 Not Estab. %   Lymphs 22 Not Estab. %   Monocytes 9 Not Estab. %   Eos 2 Not Estab. %   Basos 1 Not Estab. %   Neutrophils Absolute 4.3 1.4 - 7.0 x10E3/uL   Lymphocytes Absolute 1.4 0.7 - 3.1 x10E3/uL   Monocytes Absolute 0.6 0.1 - 0.9 x10E3/uL   EOS (ABSOLUTE) 0.1 0.0 - 0.4 x10E3/uL   Basophils Absolute 0.0 0.0 - 0.2 x10E3/uL   Immature Granulocytes 0 Not Estab. %   Immature Grans (Abs) 0.0 0.0 - 0.1 x10E3/uL  Microalbumin, Urine Waived  Result Value Ref Range   Microalb, Ur Waived 10 0 - 19 mg/L   Creatinine, Urine Waived 100 10 - 300 mg/dL   Microalb/Creat Ratio <30 <30 mg/g  CMP14+EGFR  Result Value Ref Range   Glucose 178 (H) 70 - 99 mg/dL   BUN 18 8 - 27 mg/dL   Creatinine, Ser 1.19 0.76 - 1.27 mg/dL   eGFR 64 >59 mL/min/1.73   BUN/Creatinine Ratio 15 10 - 24   Sodium 138 134 - 144 mmol/L   Potassium 4.5 3.5 - 5.2 mmol/L   Chloride 102 96 - 106 mmol/L   CO2 21 20 - 29 mmol/L   Calcium 9.4 8.6 - 10.2 mg/dL   Total Protein 6.5 6.0 - 8.5 g/dL   Albumin 4.5 3.7 - 4.7 g/dL   Globulin, Total 2.0 1.5 - 4.5 g/dL   Albumin/Globulin Ratio 2.3 (H) 1.2 - 2.2   Bilirubin Total 0.3 0.0 - 1.2 mg/dL   Alkaline Phosphatase 94 44 - 121 IU/L   AST 19 0 - 40 IU/L   ALT 23 0 - 44 IU/L  Lipid panel  Result Value Ref Range  Cholesterol, Total 141 100 - 199 mg/dL   Triglycerides 132 0 - 149 mg/dL   HDL 48 >39 mg/dL   VLDL Cholesterol Cal 23 5 - 40 mg/dL   LDL Chol Calc (NIH) 70 0 - 99 mg/dL   Chol/HDL Ratio 2.9 0.0 - 5.0 ratio        Current Outpatient Medications:    atorvastatin (LIPITOR) 20 MG tablet, Take 1 tablet (20 mg total) by mouth daily., Disp: 100 tablet,  Rfl: 0   benazepril (LOTENSIN) 40 MG tablet, Take 1 tablet (40 mg total) by mouth daily., Disp: 100 tablet, Rfl: 0   empagliflozin (JARDIANCE) 10 MG TABS tablet, Take 1 tablet (10 mg total) by mouth daily before breakfast., Disp: 30 tablet, Rfl: 3   insulin detemir (LEVEMIR FLEXTOUCH) 100 UNIT/ML FlexPen, Inject 35 units into the skin at bedtime. (Patient taking differently: Inject 25-30 Units into the skin at bedtime.), Disp: 15 mL, Rfl: 3   Insulin Pen Needle 31G X 8 MM MISC, Use one needle to administer insulin into skin nightly., Disp: 90 each, Rfl: 3   lidocaine (LIDODERM) 5 %, Place 1 patch onto the skin daily. Remove & Discard patch within 12 hours or as directed by MD (Patient taking differently: Place 1 patch onto the skin daily as needed (pain). Remove & Discard patch within 12 hours or as directed by MD), Disp: 30 patch, Rfl: 1   metFORMIN (GLUCOPHAGE) 1000 MG tablet, Take 1 tablet (1,000 mg total) by mouth 2 (two) times daily with a meal., Disp: 180 tablet, Rfl: 0   Multiple Vitamins-Minerals (CENTRUM SILVER 50+MEN) TABS, Take 1 tablet by mouth daily., Disp: , Rfl:    vitamin B-12 (CYANOCOBALAMIN) 500 MCG tablet, Take 500 mcg by mouth daily., Disp: , Rfl:    aspirin EC 81 MG tablet, Take 81 mg by mouth daily. Swallow whole., Disp: , Rfl:    cephALEXin (KEFLEX) 250 MG capsule, Take 1 capsule (250 mg total) by mouth 2 (two) times daily for 12 days., Disp: 24 capsule, Rfl: 0   metoprolol tartrate (LOPRESSOR) 25 MG tablet, Take 0.5 tablets (12.5 mg total) by mouth daily., Disp: 15 tablet, Rfl: 0    Assessment & Plan:  Lower back pain 8/10 throbbing pain  Tachycardia PR - 160  Doesn't feel this per his verbal record. ? Sec to pyelonephritis given a high fever today.  Sugars at 200's x 2 days  Hasnt slept x 40 hours.  Will send pt to the ER EMS / 911 called, pt transported  Of note has SVT on EKG done by EMS  ? Has Pyelonephritis causing SVT.  Pt looks ill and in pain  Wife with pt.       Problem List Items Addressed This Visit   None    No orders of the defined types were placed in this encounter.    No orders of the defined types were placed in this encounter.

## 2021-03-17 NOTE — Consult Note (Addendum)
Urology Consult   I have been asked to see the patient by Dr. Joni Fears, for evaluation and management of left hydroureteronephrosis and possible pyelonephritis.  Chief Complaint: Abdominal and back pain, fever  HPI:  Matthew [name] is a 76 y.o. year old male with no significant past medical history who presents with 2 to 3 days of abdominal and back pain, slightly worse on the left side, as well as fevers and malaise.  He also presented with elevated heart rate in the 160s and SVT that resolved in the ER.  He denies any past urologic history or prior urologic surgeries.  Urinalysis in the ED was consistent with infection,he had leukocytosis to 20 K, AKI, and low-grade fever of 99.  CT abdomen and pelvis shows moderate left-sided hydroureteronephrosis down to a distended bladder with multiple diverticula, and an ectopic left lateral insertion site, the right kidney and ureter are normal with no hydronephrosis, no stones or masses.  Prostate measures 70 g.  He reports baseline urinary symptoms of weak stream and feeling of incomplete emptying as well as urinary frequency.  He denies any prior UTIs or episodes of urinary retention. There is no prior cross-sectional imaging to review.  PMH: Past Medical History:  Diagnosis Date   Arthritis    bil knees and hips and back   Asthma    as a child   Hyperlipidemia    Hypertension    Sleep apnea    does not use cpap since losing weight   Type 2 diabetes mellitus ([place])     Surgical History: Past Surgical History:  Procedure Laterality Date   COLONOSCOPY     HERNIA REPAIR     KNEE ARTHROSCOPY Right 12/23/2020   Procedure: RIGHT ARTHROSCOPY KNEE PARTIAL MEDIAL AND LATERAL MENISCECTOMY;  Surgeon: Thornton Park, MD;  Location: [facility] ORS;  Service: Orthopedics;  Laterality: Right;    Allergies:  Allergies  Allergen Reactions   Ozempic (0.25 Or 0.5 Mg-Dose) [Semaglutide(0.25 Or 0.5mg -Dos)] Swelling    Family History: Family History   Problem Relation Age of Onset   Diabetes Mother    Hypertension Father    Diabetes Sister    Diabetes Brother    Diabetes Maternal Grandmother     Social History:  reports that he has never smoked. He has never used smokeless tobacco. He reports that he does not drink alcohol and does not use drugs.  ROS: Negative aside from those stated in the HPI.  Physical Exam: BP 105/63 (BP Location: Right Arm)    Pulse 82    Temp 97.7 F (36.5 C) (Oral)    Resp 19    Ht 5\' 9"  (1.753 m)    Wt 108.9 kg    SpO2 94%    BMI 35.44 kg/m    Constitutional:  Alert and oriented, No acute distress. Cardiovascular: No clubbing, cyanosis, or edema. Respiratory: Normal respiratory effort, no increased work of breathing. GI: Abdomen is soft, nontender, nondistended, no abdominal masses GU: Left CVA tenderness   Laboratory Data: Reviewed in epic, see HPI  Pertinent Imaging: I have personally reviewed the CT abdomen and pelvis shows moderate left-sided hydroureteronephrosis down to a distended bladder with multiple diverticula, and an ectopic left lateral insertion site, the right kidney and ureter are normal with no hydronephrosis, no stones or masses.  Prostate measures 70 g..  Assessment & Plan:   76 year old male who presents with 2 to 3 days of abdominal and back pain, fever and leukocytosis consistent with  UTI/pyelonephritis/early sepsis from urinary source, CT shows left hydroureteronephrosis with ectopic lateral insertion down to a distended bladder with multiple diverticula and a large prostate, normal right kidney.  No prior imaging to evaluate duration.  We discussed possible etiologies including bladder outlet obstruction with distended bladder and left reflux up the ectopic left ureter, duration unknown in the setting of no prior imaging, verse other less likely etiologies including left ureteral stricture or malignancy.  On admission, I recommended a Foley catheter to decompress the bladder  and see if the left-sided flank pain and hydronephrosis improved.  He has put out significant urine with 2.6 L overnight of light pink urine, and resolution of his left-sided flank pain.  He feels well this morning after catheter placement and really denies any complaints, WBC down, afebrile this morning.  Renal ultrasound this morning shows some persistent left-sided hydronephrosis, but bladder is decompressed.  We discussed options including maintaining Foley for 1 to 2 weeks and considering left retrograde pyelogram and possible outlet procedure with HOLEP in the next 2 to 3 weeks, versus attempting left retrograde pyelogram and stent placement today.  His clinical picture is more consistent with reflux up the left ectopic ureter as opposed to obstruction at the UVJ.  He is in agreement for antibiotics and discharge with Foley catheter in place, and I will plan to see him next week for further discussion of options and consider repeat renal ultrasound.   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  [place] [address], [place], [place] [address] [phone]

## 2021-03-17 NOTE — ED Notes (Signed)
Pt to CT at this time.

## 2021-03-17 NOTE — Telephone Encounter (Signed)
Requested Prescriptions  Pending Prescriptions Disp Refills   atorvastatin (LIPITOR) 20 MG tablet [Pharmacy Med Name: ATORVASTATIN 20 MG TABLET] 100 tablet 0    Sig: Take 1 tablet (20 mg total) by mouth daily.     Cardiovascular:  Antilipid - Statins Passed - 03/17/2021  9:21 AM      Passed - Total Cholesterol in normal range and within 360 days    Cholesterol, Total  Date Value Ref Range Status  01/31/2021 141 100 - 199 mg/dL Final   Cholesterol Piccolo, Waived  Date Value Ref Range Status  03/19/2019 150 <200 mg/dL Final    Comment:                            Desirable                <200                         Borderline High      200- 239                         High                     >239          Passed - LDL in normal range and within 360 days    LDL Chol Calc (NIH)  Date Value Ref Range Status  01/31/2021 70 0 - 99 mg/dL Final         Passed - HDL in normal range and within 360 days    HDL  Date Value Ref Range Status  01/31/2021 48 >39 mg/dL Final         Passed - Triglycerides in normal range and within 360 days    Triglycerides  Date Value Ref Range Status  01/31/2021 132 0 - 149 mg/dL Final   Triglycerides Piccolo,Waived  Date Value Ref Range Status  03/19/2019 224 (H) <150 mg/dL Final    Comment:                            Normal                   <150                         Borderline High     150 - 199                         High                200 - 499                         Very High                >499          Passed - Patient is not pregnant      Passed - Valid encounter within last 12 months    Recent Outpatient Visits          1 month ago Type 2 diabetes mellitus with hyperglycemia, with long-term current use of insulin ([place])   [place], Avanti, MD   4 months ago Diabetes  mellitus without complication ([place])   [facility] [name], Avanti, MD   5 months ago [place] [name],  Avanti, MD   5 months ago [place] [name], Avanti, MD   7 months ago Type 2 diabetes mellitus with hyperglycemia, with long-term current use of insulin ([place])   [place], [place] T, NP      Future Appointments            In 6 days [name], Avanti, MD [facility]            benazepril (LOTENSIN) 40 MG tablet [Pharmacy Med Name: BENAZEPRIL HCL 40 MG TABLET] 100 tablet 0    Sig: Take 1 tablet (40 mg total) by mouth daily.     Cardiovascular:  ACE Inhibitors Passed - 03/17/2021  9:21 AM      Passed - Cr in normal range and within 180 days    Creatinine, Ser  Date Value Ref Range Status  01/31/2021 1.19 0.76 - 1.27 mg/dL Final         Passed - K in normal range and within 180 days    Potassium  Date Value Ref Range Status  01/31/2021 4.5 3.5 - 5.2 mmol/L Final         Passed - Patient is not pregnant      Passed - Last BP in normal range    BP Readings from Last 1 Encounters:  01/31/21 118/60         Passed - Valid encounter within last 6 months    Recent Outpatient Visits          1 month ago Type 2 diabetes mellitus with hyperglycemia, with long-term current use of insulin ([place])   [facility] [name], Avanti, MD   4 months ago Diabetes mellitus without complication ([place])   [facility] [name], Avanti, MD   5 months ago [place] [name], Avanti, MD   5 months ago [place] [name], Avanti, MD   7 months ago Type 2 diabetes mellitus with hyperglycemia, with long-term current use of insulin ([place])   [place], Barbaraann Faster, NP      Future Appointments            In 6 days [name], Avanti, MD [facility]

## 2021-03-17 NOTE — ED Notes (Signed)
Pt resting on the side of the bed. Wife remains at bedside. Will continue monitor. Call light within reach.

## 2021-03-17 NOTE — Progress Notes (Signed)
°   03/17/21 2205  Clinical Encounter Type  Visited With Patient not available  Visit Type Initial  Referral From Other (Comment) (rapiod response)  Spiritual Encounters  Spiritual Needs Other (Comment) (not assessed)   Chaplain Burris attended rapid response. Chaplain bore witness to care, provided non-anxious presence, and offered silent prayer.

## 2021-03-17 NOTE — ED Notes (Signed)
Pt back from CT at this time 

## 2021-03-17 NOTE — Progress Notes (Addendum)
° ° ° °  Subjective: Patient sent to ER from urology secondary to tachycardia. SVT converted in ED. Admitted severe sepsis from puelonephritis secondary to urinary outflow obstruction (enlarged prostate) Foley catheter not placed and fluid not given. Arrive on 2A in SVT rates 150-160.   Objective: Vitals:   03/17/21 2115 03/17/21 2153  BP: 122/70 109/69  Pulse: 100 (!) 155  Resp: 17 18  Temp: 99.6 F (37.6 C) 98.5 F (36.9 C)  SpO2: 98% 99%     Assessment: asymptomatic SVT likely related to infection, relative hypovolemia from sepsis and urinary retention  Plan:  place foley Give additional 500 ml saline bolus - converted after foley placed - EKG shows SR with PAC's  Again later went back into SVT but of short duration and spontaneously converted.  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 - Echo ordered

## 2021-03-17 NOTE — ED Notes (Signed)
Pt is refusing foley insertion at this time due to fear of pain - MD Damita Dunnings) & RN informed in care handoff.

## 2021-03-17 NOTE — ED Triage Notes (Signed)
Pt to ED via [facility] from doctors office where he went because his blood sugars were above 200's, kidney pain and fever for the last two days. MD office found that his HR was 160's and called EMS. ER MD at bedside, pt converted himself while talking with MD.

## 2021-03-17 NOTE — ED Provider Notes (Signed)
[facility] Provider Note    Event Date/Time   First MD Initiated Contact with Patient 03/17/21 1555     (approximate)   History   Tachycardia and Fever   HPI  Matthew [name] is a 76 y.o. male with a past history of hypertension hyperlipidemia diabetes and obesity who comes the ED due to tachycardia.  He had gone to see his doctor due to bilateral flank pain and fever for the last 2 days.  Pain is constant, nonradiating, 8/10.  Reports he has not slept in the last 2 nights.  No vomiting diarrhea or constipation.  No dysuria.  However when he went to see his doctor, they noted that his heart rate was 160, they called EMS who noted SVT and tried vagal maneuvers which were unsuccessful.  Patient denies any chest pain or shortness of breath.  Denies any history of cardiac disease or dysrhythmia.     Physical Exam   Triage Vital Signs: ED Triage Vitals  Enc Vitals Group     BP 03/17/21 1607 95/62     Pulse Rate 03/17/21 1607 (!) 109     Resp 03/17/21 1607 20     Temp 03/17/21 1607 100.2 F (37.9 C)     Temp Source 03/17/21 1607 Oral     SpO2 03/17/21 1607 95 %     Weight 03/17/21 1609 240 lb (108.9 kg)     Height 03/17/21 1609 5\' 9"  (1.753 m)     Head Circumference --      Peak Flow --      Pain Score 03/17/21 1609 8     Pain Loc --      Pain Edu? --      Excl. in [place]? --     Most recent vital signs: Vitals:   03/17/21 1930 03/17/21 2030  BP: 99/69 117/72  Pulse: 90 96  Resp: 19 19  Temp:    SpO2: 94% 93%     General: Awake, no distress.  CV:  Good peripheral perfusion.  Tachycardia, heart rate 160, regular.  Normal pulses Resp:  Normal effort.  Clear to auscultation bilaterally Abd:  No distention.  Soft, no rigidity or guarding.  Mild suprapubic tenderness Other:  No edema or orthopnea.   ED Results / Procedures / Treatments   Labs (all labs ordered are listed, but only abnormal results are displayed) Labs Reviewed   COMPREHENSIVE METABOLIC PANEL - Abnormal; Notable for the following components:      Result Value   Sodium 131 (*)    Chloride 97 (*)    CO2 20 (*)    Glucose, Bld 214 (*)    BUN 26 (*)    Creatinine, Ser 1.70 (*)    Calcium 8.3 (*)    Albumin 3.4 (*)    GFR, Estimated 42 (*)    All other components within normal limits  CBC WITH DIFFERENTIAL/PLATELET - Abnormal; Notable for the following components:   WBC 20.2 (*)    HCT 37.4 (*)    Neutro Abs 16.8 (*)    Monocytes Absolute 2.3 (*)    Abs Immature Granulocytes 0.18 (*)    All other components within normal limits  URINALYSIS, ROUTINE W REFLEX MICROSCOPIC - Abnormal; Notable for the following components:   Color, Urine YELLOW (*)    APPearance HAZY (*)    Glucose, UA >=500 (*)    Hgb urine dipstick MODERATE (*)    Ketones, ur 5 (*)  Nitrite POSITIVE (*)    Leukocytes,Ua MODERATE (*)    All other components within normal limits  RESP PANEL BY RT-PCR (FLU A&B, COVID) ARPGX2  URINE CULTURE  LIPASE, BLOOD  ETHANOL  TSH  T4, FREE  MAGNESIUM  TROPONIN I (HIGH SENSITIVITY)  TROPONIN I (HIGH SENSITIVITY)     EKG  Initial EKG and 4:01 PM interpreted by me Supraventricular tachycardia, rate of 148, normal axis, normal intervals.  Normal QRS ST segments and T waves.  Repeat EKG at 4:02 PM interpreted by me Sinus tachycardia rate 111 with frequent premature beats.  Normal axis and intervals.  Normal QRS ST segments and T waves.  2 PVCs on the strip.   RADIOLOGY Chest x-ray viewed and interpreted by me, appears unremarkable.  No edema or effusion.  No pneumothorax.  Radiology report reviewed.  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  Right side appears normal.    PROCEDURES:  Critical Care performed: No  .1-3 Lead EKG Interpretation Performed by: Carrie Mew, MD Authorized by: Carrie Mew, MD     Interpretation:  abnormal     ECG rate:  110   ECG rate assessment: tachycardic     Rhythm: sinus rhythm     Ectopy: PAC and PVCs     Conduction: normal   Comments:        .Critical Care Performed by: Carrie Mew, MD Authorized by: Carrie Mew, MD   Critical care provider statement:    Critical care time (minutes):  35   Critical care time was exclusive of:  Separately billable procedures and treating other patients   Critical care was necessary to treat or prevent imminent or life-threatening deterioration of the following conditions:  Sepsis and renal failure   Critical care was time spent personally by me on the following activities:  Development of treatment plan with patient or surrogate, discussions with consultants, evaluation of patient's response to treatment, examination of patient, obtaining history from patient or surrogate, ordering and performing treatments and interventions, ordering and review of laboratory studies, ordering and review of radiographic studies, pulse oximetry, re-evaluation of patient's condition and review of old charts   MEDICATIONS ORDERED IN ED: Medications  sodium chloride 0.9 % bolus 1,000 mL (0 mLs Intravenous Stopped 03/17/21 1721)  iohexol (OMNIPAQUE) 300 MG/ML solution 75 mL (75 mLs Intravenous Contrast Given 03/17/21 1747)  morphine 4 MG/ML injection 4 mg (4 mg Intravenous Given 03/17/21 1918)  ondansetron (ZOFRAN) injection 4 mg (4 mg Intravenous Given 03/17/21 1918)  cefTRIAXone (ROCEPHIN) 2 g in sodium chloride 0.9 % 100 mL IVPB (0 g Intravenous Stopped 03/17/21 2056)     IMPRESSION / MDM / [place] / ED COURSE  I reviewed the triage vital signs and the nursing notes.                              Differential diagnosis includes, but is not limited to, cystitis, pyelonephritis, appendicitis, electrolyte abnormality, hyperthyroidism, viral illness, non-STEMI   The patient is on the cardiac monitor to evaluate for evidence of  arrhythmia and/or significant heart rate changes.  Patient brought to ED due to SVT which spontaneously converted to sinus rhythm during my discussion with the patient about how adenosine would work and feel.  No adenosine was required after all.  Serum lab panel is all normal.  COVID and flu normal.  Patient is nontoxic,  I do not think he is septic.  With unrevealing lab panel, CT scan was obtained which does show left-sided hydroureteronephrosis.  Will check urinalysis and discussed with urology.    ----------------------------------------- 7:13 PM on 03/17/2021 ----------------------------------------- Persistent 8/10 pain, will give IV morphine 4 mg.  Clinical Course as of 03/17/21 2108  Thu Mar 17, 2021  2028 D/w urology due to UTI with AKI and leukocytosis.  They agree with admission.  They will evaluate patient for possible cystoscopy and ureteral stenting versus nephrostomy tube placement. [PS]    Clinical Course User Index [PS] Carrie Mew, MD     ----------------------------------------- 9:08 PM on 03/17/2021 ----------------------------------------- Update from urology recommends insert Foley catheter, n.p.o. after midnight, renal ultrasound tomorrow to see if hydronephrosis has improved with bladder decompression.  Evolving clinical picture reveals UTI with sepsis.  However, to avoid delay in effective management, Rocephin was given before blood cultures could be obtained.  Case discussed with hospitalist for further management.  FINAL CLINICAL IMPRESSION(S) / ED DIAGNOSES   Final diagnoses:  Ureteral obstruction, left  SVT (supraventricular tachycardia) (HCC)  AKI (acute kidney injury) ([place])  Cystitis     Rx / DC Orders   ED Discharge Orders     None        Note:  This document was prepared using Dragon voice recognition software and may include unintentional dictation errors.   Carrie Mew, MD 03/17/21 2116

## 2021-03-18 ENCOUNTER — Inpatient Hospital Stay: Payer: Medicare HMO

## 2021-03-18 ENCOUNTER — Other Ambulatory Visit: Payer: Medicare HMO

## 2021-03-18 ENCOUNTER — Telehealth: Payer: Self-pay | Admitting: Internal Medicine

## 2021-03-18 DIAGNOSIS — A419 Sepsis, unspecified organism: Principal | ICD-10-CM

## 2021-03-18 DIAGNOSIS — N179 Acute kidney failure, unspecified: Secondary | ICD-10-CM

## 2021-03-18 DIAGNOSIS — N390 Urinary tract infection, site not specified: Secondary | ICD-10-CM

## 2021-03-18 DIAGNOSIS — N1330 Unspecified hydronephrosis: Secondary | ICD-10-CM

## 2021-03-18 DIAGNOSIS — R6520 Severe sepsis without septic shock: Secondary | ICD-10-CM

## 2021-03-18 DIAGNOSIS — N400 Enlarged prostate without lower urinary tract symptoms: Secondary | ICD-10-CM

## 2021-03-18 DIAGNOSIS — N323 Diverticulum of bladder: Secondary | ICD-10-CM

## 2021-03-18 LAB — BASIC METABOLIC PANEL
Anion gap: 7 (ref 5–15)
BUN: 30 mg/dL — ABNORMAL HIGH (ref 8–23)
CO2: 23 mmol/L (ref 22–32)
Calcium: 7.5 mg/dL — ABNORMAL LOW (ref 8.9–10.3)
Chloride: 102 mmol/L (ref 98–111)
Creatinine, Ser: 1.61 mg/dL — ABNORMAL HIGH (ref 0.61–1.24)
GFR, Estimated: 45 mL/min — ABNORMAL LOW (ref >60)
Glucose, Bld: 163 mg/dL — ABNORMAL HIGH (ref 70–99)
Potassium: 4.2 mmol/L (ref 3.5–5.1)
Sodium: 132 mmol/L — ABNORMAL LOW (ref 135–145)

## 2021-03-18 LAB — GLUCOSE, CAPILLARY
Glucose-Capillary: 137 mg/dL — ABNORMAL HIGH (ref 70–99)
Glucose-Capillary: 143 mg/dL — ABNORMAL HIGH (ref 70–99)
Glucose-Capillary: 153 mg/dL — ABNORMAL HIGH (ref 70–99)
Glucose-Capillary: 153 mg/dL — ABNORMAL HIGH (ref 70–99)
Glucose-Capillary: 249 mg/dL — ABNORMAL HIGH (ref 70–99)
Glucose-Capillary: 356 mg/dL — ABNORMAL HIGH (ref 70–99)

## 2021-03-18 LAB — CBC
HCT: 34.1 % — ABNORMAL LOW (ref 39.0–52.0)
Hemoglobin: 11.6 g/dL — ABNORMAL LOW (ref 13.0–17.0)
MCH: 29.7 pg (ref 26.0–34.0)
MCHC: 34 g/dL (ref 30.0–36.0)
MCV: 87.4 fL (ref 80.0–100.0)
Platelets: 233 10*3/uL (ref 150–400)
RBC: 3.9 MIL/uL — ABNORMAL LOW (ref 4.22–5.81)
RDW: 13 % (ref 11.5–15.5)
WBC: 14.5 10*3/uL — ABNORMAL HIGH (ref 4.0–10.5)
nRBC: 0 % (ref 0.0–0.2)

## 2021-03-18 LAB — PROCALCITONIN: Procalcitonin: 0.71 ng/mL

## 2021-03-18 LAB — HEMOGLOBIN A1C
Hgb A1c MFr Bld: 8.5 % — ABNORMAL HIGH (ref 4.8–5.6)
Mean Plasma Glucose: 197 mg/dL

## 2021-03-18 LAB — PROTIME-INR
INR: 1.1 (ref 0.8–1.2)
Prothrombin Time: 14.5 seconds (ref 11.4–15.2)

## 2021-03-18 LAB — CORTISOL-AM, BLOOD: Cortisol - AM: 23.1 ug/dL — ABNORMAL HIGH (ref 6.7–22.6)

## 2021-03-18 LAB — LACTIC ACID, PLASMA: Lactic Acid, Venous: 0.9 mmol/L (ref 0.5–1.9)

## 2021-03-18 LAB — BRAIN NATRIURETIC PEPTIDE: B Natriuretic Peptide: 106.5 pg/mL — ABNORMAL HIGH (ref 0.0–100.0)

## 2021-03-18 MED ORDER — METOPROLOL TARTRATE 25 MG PO TABS
12.5000 mg | ORAL_TABLET | Freq: Two times a day (BID) | ORAL | Status: DC
  Administered 2021-03-18 – 2021-03-19 (×3): 12.5 mg via ORAL
  Filled 2021-03-18 (×3): qty 1

## 2021-03-18 MED ORDER — METOPROLOL TARTRATE 5 MG/5ML IV SOLN: 10.0000 mg | Freq: Once | INTRAVENOUS | Status: DC

## 2021-03-18 MED ORDER — SODIUM CHLORIDE 0.9 % IV BOLUS
500.0000 mL | Freq: Once | INTRAVENOUS | Status: AC
Start: 2021-03-18 — End: 2021-03-18
  Administered 2021-03-18: 500 mL via INTRAVENOUS

## 2021-03-18 MED ORDER — CHLORHEXIDINE GLUCONATE CLOTH 2 % EX PADS
6.0000 | MEDICATED_PAD | Freq: Every day | CUTANEOUS | Status: DC
  Administered 2021-03-18 – 2021-03-19 (×2): 6 via TOPICAL

## 2021-03-18 NOTE — Consult Note (Addendum)
CARDIOLOGY CONSULT NOTE               Patient ID: Matthew [name] MRN: [id number] DOB/AGE: [birth year] 76 y.o.  Admit date: 03/17/2021 Referring Physician Dr. Max Sane Primary Physician Avanti Schubert Primary Cardiologist none Reason for Consultation SVT  HPI: The patient is a 76 year old male with a past medical history significant for type 2 diabetes, hypertension, hyperlipidemia, BPH who presented to [facility] ED 03/17/2021 with 2-day history of bilateral flank pain and fever.  He saw his primary care provider earlier in the day who found him apparently to be in SVT with rates in the 160s, vagal maneuvers were attempted by EMS without termination and the patient was subsequently brought to the ED and spontaneously converted to sinus tach without pharmacologic measures needed.  He is admitted with severe sepsis 2/2 Pyelonephritis.  Cardiology is consulted for assistance with management of his paroxysmal SVT.  Rapid response was called overnight this patient was in SVT again with heart rate in the 150s.  He received metoprolol 5 mg IV x2 and a 500 mL bolus of fluids and converted back into normal sinus rhythm again with PACs.  The patient seen during interview today and his wife is at bedside.  Patient states he did not even feel his heart racing quickly he was just sent over from his primary care provider's office.  He endorsed back pain on admission but states he is doing much better since receiving antibiotics and other supportive care.  He currently denies chest pain, shortness of breath, palpitations, lower extremity edema, orthopnea.  He states he is very active and walks his 2 dogs about 1/2 mile daily without apparent difficulty or exertional dyspnea.  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  He states he has never been seen by a cardiologist in the past.  Review of systems complete and found to be negative unless listed above     Past  Medical History:  Diagnosis Date   Arthritis    bil knees and hips and back   Asthma    as a child   Hyperlipidemia    Hypertension    Sleep apnea    does not use cpap since losing weight   Type 2 diabetes mellitus ([place])     Past Surgical History:  Procedure Laterality Date   COLONOSCOPY     HERNIA REPAIR     KNEE ARTHROSCOPY Right 12/23/2020   Procedure: RIGHT ARTHROSCOPY KNEE PARTIAL MEDIAL AND LATERAL MENISCECTOMY;  Surgeon: Thornton Park, MD;  Location: [facility] ORS;  Service: Orthopedics;  Laterality: Right;    Medications Prior to Admission  Medication Sig Dispense Refill Last Dose   aspirin EC 81 MG tablet Take 81 mg by mouth daily. Swallow whole.   03/17/2021   atorvastatin (LIPITOR) 20 MG tablet Take 1 tablet (20 mg total) by mouth daily. 100 tablet 0 03/16/2021   benazepril (LOTENSIN) 40 MG tablet Take 1 tablet (40 mg total) by mouth daily. 100 tablet 0 03/16/2021   empagliflozin (JARDIANCE) 10 MG TABS tablet Take 1 tablet (10 mg total) by mouth daily before breakfast. 30 tablet 3 03/17/2021   insulin detemir (LEVEMIR FLEXTOUCH) 100 UNIT/ML FlexPen Inject 35 units into the skin at bedtime. (Patient taking differently: Inject 25-30 Units into the skin at bedtime.) 15 mL 3 03/16/2021   lidocaine (LIDODERM) 5 % Place 1 patch onto the skin daily. Remove & Discard patch within 12 hours or as directed  by MD (Patient taking differently: Place 1 patch onto the skin daily as needed (pain). Remove & Discard patch within 12 hours or as directed by MD) 30 patch 1 unk   metFORMIN (GLUCOPHAGE) 1000 MG tablet Take 1 tablet (1,000 mg total) by mouth 2 (two) times daily with a meal. 180 tablet 0 03/17/2021 at am   Multiple Vitamins-Minerals (CENTRUM SILVER 50+MEN) TABS Take 1 tablet by mouth daily.   03/16/2021   vitamin B-12 (CYANOCOBALAMIN) 500 MCG tablet Take 500 mcg by mouth daily.   03/16/2021   Insulin Pen Needle 31G X 8 MM MISC Use one needle to administer insulin into skin nightly. 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   Social Determinants of Radio broadcast assistant Strain: Low Risk    Difficulty of Paying Living Expenses: Not hard at all  Food Insecurity: No Food Insecurity   Worried About Charity fundraiser in the Last Year: Never true   Arboriculturist in the Last Year: Never true  Transportation Needs: No Transportation Needs   Lack of Transportation (Medical): No   Lack of Transportation (Non-Medical): No  Physical Activity: Inactive   Days of Exercise per Week: 0 days   Minutes of Exercise per Session: 0 min  Stress: No Stress Concern Present   Feeling of Stress : Only a little  Social Connections: Engineer, building services of Communication with Friends and Family: Once a week   Frequency of Social Gatherings with Friends and Family: More than three times a week   Attends Religious Services: More than 4 times per year   Active Member of [employer] or Organizations: Yes   Attends Music therapist: More than 4 times per year   Marital Status: Married  Human resources officer Violence: Not At Risk   Fear of Current or Ex-Partner: No   Emotionally Abused: No   Physically Abused: No   Sexually Abused: No    Family History  Problem Relation Age of Onset   Diabetes Mother    Hypertension Father    Diabetes Sister    Diabetes Brother    Diabetes Maternal Grandmother        Review of systems complete and found to be negative unless listed above    PHYSICAL EXAM General: Pleasant elderly Caucasian male, well nourished, in no acute distress.  Sitting upright in PCU bed.  Wife at bedside. HEENT:  Normocephalic and atraumatic. Neck:  No JVD.  Lungs: Normal respiratory effort on room air. Clear bilaterally to auscultation. No wheezes, crackles, rhonchi.  Heart: HRRR . Normal S1 and S2 without gallops or murmurs. Radial & DP pulses 2+ bilaterally. Abdomen: Non-distended appearing.  Msk: Normal strength and tone for age. Extremities: No clubbing, cyanosis or edema.  Neuro: Alert and oriented X 3. Psych:  Mood appropriate, affect congruent.   Labs:   Lab Results  Component Value Date   WBC 14.5 (H) 03/18/2021   HGB 11.6 (L) 03/18/2021   HCT 34.1 (L) 03/18/2021   MCV 87.4 03/18/2021   PLT 233 03/18/2021    Recent Labs  Lab 03/17/21 1613 03/18/21 0609  NA 131* 132*  K 3.6 4.2  CL 97* 102  CO2 20* 23  BUN 26* 30*  CREATININE 1.70* 1.61*  CALCIUM 8.3* 7.5*  PROT 7.1  --   BILITOT 1.0  --   ALKPHOS 67  --   ALT 24  --   AST 18  --   GLUCOSE 214* 163*   No results found for: CKTOTAL, CKMB, CKMBINDEX, TROPONINI  Lab Results  Component Value Date   CHOL 141 01/31/2021   CHOL 169 09/21/2020   CHOL 133 07/21/2020   Lab Results  Component Value Date   HDL 48 01/31/2021   HDL 45 09/21/2020   HDL 42 07/21/2020   Lab Results  Component Value Date   LDLCALC 70 01/31/2021   LDLCALC 93 09/21/2020   LDLCALC 55 07/21/2020   Lab Results  Component Value Date   TRIG 132 01/31/2021   TRIG 179 (H) 09/21/2020   TRIG 227 (H) 07/21/2020   Lab Results  Component Value Date   CHOLHDL 2.9 01/31/2021   CHOLHDL 3.8 09/21/2020   CHOLHDL 3.1 05/23/2016   No results found for: LDLDIRECT    Radiology: CT ABDOMEN PELVIS W CONTRAST  Result Date: 03/17/2021 CLINICAL DATA:  Abdominal pain, EXAM: CT ABDOMEN AND PELVIS WITH CONTRAST TECHNIQUE:  Multidetector CT imaging of the abdomen and pelvis was performed using the standard protocol following bolus administration of intravenous contrast. RADIATION DOSE REDUCTION: This exam was performed according to the departmental dose-optimization program which includes automated exposure control, adjustment of the mA and/or kV according to patient size and/or use of iterative reconstruction technique. CONTRAST:  23mL OMNIPAQUE IOHEXOL 300 MG/ML  SOLN COMPARISON:  None. FINDINGS: Lower chest: Lung bases are clear. Hepatobiliary: Gallstones present without acute cholecystitis. No biliary duct dilatation. Pancreas: Pancreas is normal. No ductal dilatation. No pancreatic inflammation. Spleen: Normal spleen Adrenals/urinary tract: Adrenal glands normal. Hydronephrosis and hydroureter of the LEFT renal collecting system. The LEFT ureter is ectatic. Hydroureter extends into the pelvis. Atypical insertion of the LEFT ureter in the LEFT lateral aspect of the bladder (image 74/2). No obstructing lesion identified. No excretion of contrast on delayed imaging (series 7). RIGHT kidney normal.  No hydronephrosis or hydroureter. Several diverticula of the bladder.  No Asymmetric soft tissue at the base the bladder is favored prostate tissue (image 81/2. Stomach/Bowel: Stomach, small bowel, appendix, and cecum are normal. The colon and rectosigmoid colon are normal. Vascular/Lymphatic: Abdominal aorta is normal caliber with atherosclerotic calcification. There is no retroperitoneal or periportal lymphadenopathy. No pelvic lymphadenopathy. Reproductive: Prostate enlarged. Other: No free fluid. Musculoskeletal: No aggressive osseous lesion. IMPRESSION: 1. Moderate LEFT hydronephrosis and hydroureter. No excretion of contrast on delayed imaging. LEFT hydroureter extends to the level of the bladder. Atypical lateral insertion site of the ureter/vesicoureteral junction. No obstructing lesion identified. 2. Several small bladder  diverticula. 3. Prostate hypertrophy. 4. RIGHT kidney normal. 5. Cholelithiasis without cholecystitis. Electronically Signed   By: Suzy Bouchard M.D.   On: 03/17/2021 18:16   US RENAL  Result Date: 03/18/2021 CLINICAL DATA:  Left hydronephrosis EXAM: RENAL / URINARY TRACT ULTRASOUND COMPLETE COMPARISON:  CT examination dated  March 17, 2021 FINDINGS: Right Kidney: Renal measurements: 12.7 x 6.5 x 5.8 = volume: 249 mL. Echogenicity within normal limits. No mass or hydronephrosis visualized. Left Kidney: Renal measurements: 12.6 x 5.8 x 5.8 = volume: 220 mL. Echogenicity within normal limits. No appreciable mass or calculus. Moderate left hydronephrosis and dilated proximal left ureter. Bladder: Urinary bladder is collapsed about the Foley's catheter Other: None. IMPRESSION: 1. Moderate left hydroureteronephrosis without evidence of calculus. The findings may be secondary to abnormal insertion of the left ureter into the urinary bladder, as seen on the CT examination. CT urogram and urology consultation for further management is recommended. 2. The right kidney and ureter are unremarkable. Urinary bladder is collapsed about the Foley's catheter limiting elevation. Electronically Signed   By: Keane Police D.O.   On: 03/18/2021 10:09   DG Chest Portable 1 View  Result Date: 03/17/2021 CLINICAL DATA:  Tachycardia, fever for the last 2 days EXAM: PORTABLE CHEST 1 VIEW COMPARISON:  03/17/2021 FINDINGS: The heart size and mediastinal contours are within normal limits. Both lungs are clear. The visualized skeletal structures are unremarkable. IMPRESSION: No active disease. Electronically Signed   By: Kathreen Devoid M.D.   On: 03/17/2021 16:23    ECHO no prior  TELEMETRY reviewed by me: Sinus rhythm rate of 80 with frequent PVCs.  EKG reviewed by me and Dr Nehemiah Massed: Long RP tachycardia rate 139 (03/17/21 at 22:32  ASSESSMENT AND PLAN:  The patient is a 75 year old male with a past medical history significant  for type 2 diabetes, hypertension, hyperlipidemia, BPH who presented to [facility] ED 03/17/2021 with 2-day history of bilateral flank pain and fever.  He saw his primary care provider earlier in the day who found him apparently to be in SVT with rates in the 160s, vagal maneuvers were attempted by EMS without termination and the patient was subsequently brought to the ED and spontaneously converted to sinus tach without pharmacologic measures needed.Marland Kitchen  He is admitted with severe sepsis 2/2 Pyelonephritis.  Cardiology is consulted for assistance with management of his SVT.  #Paroxysmal SVT (long RP tachycardia) #Hypertension #Hyperlipidemia The patient has had 2 episodes of paroxysmal long RP tachycardia since admission.  These have either spontaneously terminated on their own or with the addition of metoprolol.  The patient is asymptomatic from these episodes and does not even know they are happening, just discovered by telemetry monitoring. This is likely happening in the setting of increased catecholamines due to ongoing sepsis/ pyelonephritis -We will initiate metoprolol tartrate 12.5 mg twice daily today with goals to uptitrate to 25 mg twice daily at discharge, as his blood pressure allows. -Reinitiate home medications benzapril 40 mg once daily, HCTZ 12.5 once daily as appropriate by primary team. -Continue high intensity statin therapy with atorvastatin -Echocardiogram ordered and will follow results -He can follow-up in outpatient office with Dr. Nehemiah Massed in 1 to 2 weeks after discharge for consideration of Holter monitoring thereafter.  This patient's plan of care was discussed and created with Dr. Serafina Royals and he is in agreement.  Signed: Tristan Schroeder , PA-C 03/18/2021, 11:43 AM   The patient has been interviewed and examined. I agree with assessment and plan above. Serafina Royals MD [facility]

## 2021-03-18 NOTE — Clinical Social Work Note (Signed)
°  Transition of Care [facility]) Screening Note   Patient Details  Name: Matthew [name] Date of Birth: November 21, 1945   Transition of [facility]) CM/SW Contact:    Eileen Stanford, LCSW Phone [place] 03/18/2021, 12:06 PM    Transition of Care Department [facility]) has reviewed patient and no TOC needs have been identified at this time. We will continue to monitor patient advancement through interdisciplinary progression rounds. If new patient transition needs arise, please place a TOC consult.

## 2021-03-18 NOTE — Assessment & Plan Note (Signed)
Controlled with metoprolol

## 2021-03-18 NOTE — Assessment & Plan Note (Signed)
Due to urinary source. Continue IV rocephin for now

## 2021-03-18 NOTE — Assessment & Plan Note (Signed)
Controlled with metoprolol, this is likely due to urine infection/sepsis

## 2021-03-18 NOTE — Assessment & Plan Note (Signed)
Keep foley for 1-2 weeks per urology and outpt urology f/up for further intervention

## 2021-03-18 NOTE — Progress Notes (Signed)
°  Progress Note   Patient: Matthew [name] TLX:[id number] DOB: 05/31/1945 DOA: 03/17/2021     1 DOS: the patient was seen and examined on 03/18/2021   Brief hospital course: 76 y.o. male with medical history significant for DM, HTN, BPH, HLD who presents to the ED with a 2-day history of bilateral flank pain and fever.  He went to see his PCP who found him to be in SVT with a rate in the 160s.  EMS attempted vagal maneuvers without improvement and patient was brought to the ED.  1/20 - Urology and cardio seen, no furthr SVT on metoprolol, urology recommends keeping foley for 1-2 weeks and outpt urology f/up   Assessment and Plan * Severe sepsis ([place])- (present on admission) Due to urinary source. Continue IV rocephin for now  Paroxysmal SVT (supraventricular tachycardia) ([place])- (present on admission) Controlled with metoprolol, this is likely due to urine infection/sepsis  Hydronephrosis of left kidney- (present on admission) Foley for 1-2 weeks per urology and office f/up. Repeat US then  AKI (acute kidney injury) ([place])- (present on admission) Could be post-obstructive, monitor with foley.  BPH (benign prostatic hyperplasia)- (present on admission) Keep foley for 1-2 weeks per urology and outpt urology f/up for further intervention  HTN (hypertension) Controlled with metoprolol  Type 2 diabetes mellitus with hyperglycemia ([place])- (present on admission) SSI & levemir   Subjective: wants to eat, hungry. Feels fine otherwise.  Objective Vital signs were reviewed and unremarkable.  Constitutional:Alert, oriented x 3 . Not in any apparent distress HEENT: Head:Normocephalic and atraumatic.  Eyes:PERLA, EOMI, Conjunctivae are normal. Sclera is non-icteric.  Mouth/Throat:Mucous membranes are moist.  Neck:Supple with no signs of meningismus. Cardiovascular:Regular rate and rhythm. No murmurs, gallops, or rubs. 2+ symmetrical distal pulses are present . No  JVD. No  LE edema Respiratory:Respiratory effort normal .Lungs sounds clear bilaterally. No wheezes, crackles, or rhonchi.  Gastrointestinal:Soft, non tender, non distended. Positive bowel sounds.  Genitourinary:Foley draining clear urine Musculoskeletal:Nontender with normal range of motion in all extremities. No cyanosis, or erythema of extremities. Neurologic:  Face is symmetric. Moving all extremities. No gross focal neurologic deficits . Skin:Skin is warm, dry.  No rash or ulcers Psychiatric: Mood and affect are appropriate   Data Reviewed:  sugars running high, creat, WBC up  Family Communication: none at bedside  Disposition: Status is: Inpatient  Remains inpatient appropriate because: monitor for fever curve and cardiac arrythmias overnight    DVT prophylaxis - SCDs    Time spent: 35 minutes  Author: Max Sane, MD 03/18/2021 7:09 PM  For on call review [email or web address].

## 2021-03-18 NOTE — Assessment & Plan Note (Addendum)
SSI & levemir

## 2021-03-18 NOTE — Progress Notes (Signed)
Upon getting to unit, pt found to be in SVT.  Pt asymptomatic, but rapid response called regardless.  Pt medicated per [facility] and then converted when pt had foley catheter placed.  Pt now resting comfortably.    03/17/21 2153  Assess: MEWS Score  Temp 98.5 F (36.9 C)  BP 109/69  Pulse Rate (!) 155  Resp 18  SpO2 99 %  O2 Device Room Air  Assess: MEWS Score  MEWS Temp 0  MEWS Systolic 0  MEWS Pulse 3  MEWS RR 0  MEWS LOC 0  MEWS Score 3  MEWS Score Color Yellow  Assess: if the MEWS score is Yellow or Red  Were vital signs taken at a resting state? Yes  Focused Assessment No change from prior assessment  Does the patient meet 2 or more of the SIRS criteria? Yes  Does the patient have a confirmed or suspected source of infection? Yes  Provider and Rapid Response Notified? Yes  MEWS guidelines implemented *See Row Information* Yes  Treat  MEWS Interventions Escalated (See documentation below)  Take Vital Signs  Increase Vital Sign Frequency  Yellow: Q 2hr X 2 then Q 4hr X 2, if remains yellow, continue Q 4hrs  Escalate  MEWS: Escalate Yellow: discuss with charge nurse/RN and consider discussing with provider and RRT  Notify: Charge Nurse/RN  Name of Charge Nurse/RN Notified Felicia, RN  Date Charge Nurse/RN Notified 03/17/21  Time Charge Nurse/RN Notified 2200  Notify: Provider  Provider Name/Title B Morrisson  Date Provider Notified 03/17/21  Time Provider Notified 2200  Notification Type Page (Rapid Response called)  Notification Reason Change in status  Provider response See new orders;At bedside  Date of Provider Response 03/18/21  Time of Provider Response 2215  Notify: Rapid Response  Name of Rapid Response RN Notified Lu Duffel, RN  Date Rapid Response Notified 03/17/21  Time Rapid Response Notified 2200  Document  Patient Outcome Stabilized after interventions  Progress note created (see row info) Yes  Assess: SIRS CRITERIA  SIRS Temperature  0  SIRS Pulse 1   SIRS Respirations  0  SIRS WBC 0  SIRS Score Sum  1

## 2021-03-18 NOTE — Assessment & Plan Note (Signed)
Could be post-obstructive, monitor with foley.

## 2021-03-18 NOTE — Progress Notes (Signed)
Spoke to patient primary nurse Bonnita Nasuti. Patients Vitals are within normal limits Bp 104/74 Hr 89 RR 18 and O2 96 on RA. Primary nurse with no concerns and instructed to reach out to rapid response nurse if concerns arise.

## 2021-03-18 NOTE — Hospital Course (Signed)
76 y.o. male with medical history significant for DM, HTN, BPH, HLD who presents to the ED with a 2-day history of bilateral flank pain and fever.  He went to see his PCP who found him to be in SVT with a rate in the 160s.  EMS attempted vagal maneuvers without improvement and patient was brought to the ED.  1/20 - Urology and cardio seen, no furthr SVT on metoprolol, urology recommends keeping foley for 1-2 weeks and outpt urology f/up

## 2021-03-18 NOTE — Assessment & Plan Note (Signed)
Foley for 1-2 weeks per urology and office f/up. Repeat US then

## 2021-03-18 NOTE — Telephone Encounter (Signed)
Copied from [place] [phone]. Topic: General - Other >> Mar 18, 2021  8:56 AM Leward Quan A wrote: Reason for CRM: Patient wife called in to inform Dr Neomia Dear that patient had a couple more of issues with the elevated heart rate. 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

## 2021-03-19 ENCOUNTER — Inpatient Hospital Stay
Admit: 2021-03-19 | Discharge: 2021-03-19 | Disposition: A | Discharge status: Home / Self Care | Payer: Medicare HMO | Attending: Acute Care | Admitting: Acute Care

## 2021-03-19 DIAGNOSIS — I471 Supraventricular tachycardia: Secondary | ICD-10-CM

## 2021-03-19 DIAGNOSIS — N135 Crossing vessel and stricture of ureter without hydronephrosis: Secondary | ICD-10-CM

## 2021-03-19 LAB — BASIC METABOLIC PANEL
Anion gap: 11 (ref 5–15)
BUN: 22 mg/dL (ref 8–23)
CO2: 24 mmol/L (ref 22–32)
Calcium: 7.7 mg/dL — ABNORMAL LOW (ref 8.9–10.3)
Chloride: 102 mmol/L (ref 98–111)
Creatinine, Ser: 1.36 mg/dL — ABNORMAL HIGH (ref 0.61–1.24)
GFR, Estimated: 55 mL/min — ABNORMAL LOW (ref >60)
Glucose, Bld: 151 mg/dL — ABNORMAL HIGH (ref 70–99)
Potassium: 4 mmol/L (ref 3.5–5.1)
Sodium: 137 mmol/L (ref 135–145)

## 2021-03-19 LAB — GLUCOSE, CAPILLARY
Glucose-Capillary: 151 mg/dL — ABNORMAL HIGH (ref 70–99)
Glucose-Capillary: 139 mg/dL — ABNORMAL HIGH (ref 70–99)

## 2021-03-19 LAB — CBC
HCT: 32.9 % — ABNORMAL LOW (ref 39.0–52.0)
Hemoglobin: 11 g/dL — ABNORMAL LOW (ref 13.0–17.0)
MCH: 29.4 pg (ref 26.0–34.0)
MCHC: 33.4 g/dL (ref 30.0–36.0)
MCV: 88 fL (ref 80.0–100.0)
Platelets: 238 10*3/uL (ref 150–400)
RBC: 3.74 MIL/uL — ABNORMAL LOW (ref 4.22–5.81)
RDW: 13.1 % (ref 11.5–15.5)
WBC: 11.4 10*3/uL — ABNORMAL HIGH (ref 4.0–10.5)
nRBC: 0 % (ref 0.0–0.2)

## 2021-03-19 MED ORDER — METOPROLOL TARTRATE 25 MG PO TABS
12.5000 mg | ORAL_TABLET | Freq: Every day | ORAL | 0 refills | Status: DC
Start: 2021-03-19 — End: 2021-12-26

## 2021-03-19 MED ORDER — CEPHALEXIN 250 MG PO CAPS: 250.0000 mg | ORAL_CAPSULE | Freq: Two times a day (BID) | ORAL | 0 refills | Status: AC

## 2021-03-19 NOTE — Progress Notes (Signed)
[facility] Encounter Note  Patient: Matthew [name] / Admit Date: 03/17/2021 / Date of Encounter: 03/19/2021, 7:38 AM   Subjective: Patient overall improving from the infection standpoint with no evidence of significant symptoms of cardiovascular concerns.  Last episode of supraventricular tachycardia was yesterday with now addition of metoprolol.  Patient does have a few preventricular contractions and some sinus tachycardia.  No other cardiovascular symptoms at this time  Review of Systems: Positive for: None Negative for: Vision change, hearing change, syncope, dizziness, nausea, vomiting,diarrhea, bloody stool, stomach pain, cough, congestion, diaphoresis, urinary frequency, urinary pain,skin lesions, skin rashes Others previously listed  Objective: Telemetry: Normal sinus rhythm with PVCs Physical Exam: Blood pressure 121/68, pulse 80, temperature 98.1 F (36.7 C), temperature source Oral, resp. rate 20, height 5\' 9"  (1.753 m), weight 108.9 kg, SpO2 98 %. Body mass index is 35.44 kg/m. General: Well developed, well nourished, in no acute distress. Head: Normocephalic, atraumatic, sclera non-icteric, no xanthomas, nares are without discharge. Neck: No apparent masses Lungs: Normal respirations with no wheezes, no rhonchi, no rales , no crackles   Heart: Regular rate and rhythm, normal S1 S2, no murmur, no rub, no gallop, PMI is normal size and placement, carotid upstroke normal without bruit, jugular venous pressure normal Abdomen: Soft, non-tender, non-distended with normoactive bowel sounds. No hepatosplenomegaly. Abdominal aorta is normal size without bruit Extremities: No edema, no clubbing, no cyanosis, no ulcers,  Peripheral: 2+ radial, 2+ femoral, 2+ dorsal pedal pulses Neuro: Alert and oriented. Moves all extremities spontaneously. Psych:  Responds to questions appropriately with a normal affect.   Intake/Output Summary (Last 24 hours) at 03/19/2021  0738 Last data filed at 03/19/2021 0731 Gross per 24 hour  Intake 2196.8 ml  Output 3950 ml  Net -1753.2 ml    Inpatient Medications:   Chlorhexidine Gluconate Cloth  6 each Topical Daily   insulin aspart  0-15 Units Subcutaneous Q4H   insulin detemir  10 Units Subcutaneous QHS   metoprolol tartrate  12.5 mg Oral BID   Infusions:   sodium chloride 150 mL/hr at 03/18/21 1833   cefTRIAXone (ROCEPHIN)  IV Stopped (03/18/21 2057)    Labs: Recent Labs    03/17/21 1613 03/18/21 0609  NA 131* 132*  K 3.6 4.2  CL 97* 102  CO2 20* 23  GLUCOSE 214* 163*  BUN 26* 30*  CREATININE 1.70* 1.61*  CALCIUM 8.3* 7.5*  MG 1.7  --    Recent Labs    03/17/21 1613  AST 18  ALT 24  ALKPHOS 67  BILITOT 1.0  PROT 7.1  ALBUMIN 3.4*   Recent Labs    03/17/21 1613 03/18/21 0609  WBC 20.2* 14.5*  NEUTROABS 16.8*  --   HGB 13.2 11.6*  HCT 37.4* 34.1*  MCV 86.2 87.4  PLT 245 233   No results for input(s): CKTOTAL, CKMB, TROPONINI in the last 72 hours. Invalid input(s): POCBNP Recent Labs    03/17/21 1613  HGBA1C 8.5*     Weights: Filed Weights   03/17/21 1609  Weight: 108.9 kg     Radiology/Studies:  CT ABDOMEN PELVIS W CONTRAST  Result Date: 03/17/2021 CLINICAL DATA:  Abdominal pain, EXAM: CT ABDOMEN AND PELVIS WITH CONTRAST TECHNIQUE: Multidetector CT imaging of the abdomen and pelvis was performed using the standard protocol following bolus administration of intravenous contrast. RADIATION DOSE REDUCTION: This exam was performed according to the departmental dose-optimization program which includes automated exposure control, adjustment of the mA and/or kV according to  patient size and/or use of iterative reconstruction technique. CONTRAST:  57mL OMNIPAQUE IOHEXOL 300 MG/ML  SOLN COMPARISON:  None. FINDINGS: Lower chest: Lung bases are clear. Hepatobiliary: Gallstones present without acute cholecystitis. No biliary duct dilatation. Pancreas: Pancreas is normal. No ductal  dilatation. No pancreatic inflammation. Spleen: Normal spleen Adrenals/urinary tract: Adrenal glands normal. Hydronephrosis and hydroureter of the LEFT renal collecting system. The LEFT ureter is ectatic. Hydroureter extends into the pelvis. Atypical insertion of the LEFT ureter in the LEFT lateral aspect of the bladder (image 74/2). No obstructing lesion identified. No excretion of contrast on delayed imaging (series 7). RIGHT kidney normal.  No hydronephrosis or hydroureter. Several diverticula of the bladder.  No Asymmetric soft tissue at the base the bladder is favored prostate tissue (image 81/2. Stomach/Bowel: Stomach, small bowel, appendix, and cecum are normal. The colon and rectosigmoid colon are normal. Vascular/Lymphatic: Abdominal aorta is normal caliber with atherosclerotic calcification. There is no retroperitoneal or periportal lymphadenopathy. No pelvic lymphadenopathy. Reproductive: Prostate enlarged. Other: No free fluid. Musculoskeletal: No aggressive osseous lesion. IMPRESSION: 1. Moderate LEFT hydronephrosis and hydroureter. No excretion of contrast on delayed imaging. LEFT hydroureter extends to the level of the bladder. Atypical lateral insertion site of the ureter/vesicoureteral junction. No obstructing lesion identified. 2. Several small bladder diverticula. 3. Prostate hypertrophy. 4. RIGHT kidney normal. 5. Cholelithiasis without cholecystitis. Electronically Signed   By: Suzy Bouchard M.D.   On: 03/17/2021 18:16   US RENAL  Result Date: 03/18/2021 CLINICAL DATA:  Left hydronephrosis EXAM: RENAL / URINARY TRACT ULTRASOUND COMPLETE COMPARISON:  CT examination dated March 17, 2021 FINDINGS: Right Kidney: Renal measurements: 12.7 x 6.5 x 5.8 = volume: 249 mL. Echogenicity within normal limits. No mass or hydronephrosis visualized. Left Kidney: Renal measurements: 12.6 x 5.8 x 5.8 = volume: 220 mL. Echogenicity within normal limits. No appreciable mass or calculus. Moderate left  hydronephrosis and dilated proximal left ureter. Bladder: Urinary bladder is collapsed about the Foley's catheter Other: None. IMPRESSION: 1. Moderate left hydroureteronephrosis without evidence of calculus. The findings may be secondary to abnormal insertion of the left ureter into the urinary bladder, as seen on the CT examination. CT urogram and urology consultation for further management is recommended. 2. The right kidney and ureter are unremarkable. Urinary bladder is collapsed about the Foley's catheter limiting elevation. Electronically Signed   By: Keane Police D.O.   On: 03/18/2021 10:09   DG Chest Portable 1 View  Result Date: 03/17/2021 CLINICAL DATA:  Tachycardia, fever for the last 2 days EXAM: PORTABLE CHEST 1 VIEW COMPARISON:  03/17/2021 FINDINGS: The heart size and mediastinal contours are within normal limits. Both lungs are clear. The visualized skeletal structures are unremarkable. IMPRESSION: No active disease. Electronically Signed   By: Kathreen Devoid M.D.   On: 03/17/2021 16:23     Assessment and Recommendation  76 y.o. male with known pyelonephritis and sepsis likely exacerbating issues with concerns of supraventricular tachycardia possibly consistent with long RP tachycardia now appears to be suppressed with low-dose metoprolol 1.  Continue low-dose metoprolol at this time for further risk reduction of tachycardia 2.  No further cardiac intervention at this time 3.  Supportive care for infection 4.  Begin ambulation and follow-up for improvements of symptoms and watching for further rhythm disturbances  Signed, Serafina Royals M.D. FACC

## 2021-03-20 LAB — URINE CULTURE: Culture: 80000 — AB

## 2021-03-20 NOTE — Discharge Summary (Signed)
Physician Discharge Summary   Patient: Matthew [name] MRN: [id number] DOB: [birth year]  Admit date:     03/17/2021  Discharge date: 03/19/2021  Discharge Physician: Max Sane   PCP: Charlynne Cousins, MD   Recommendations at discharge:    Follow up with outpt providers as requested  Discharge Diagnoses Principal Problem:   Severe sepsis ([place]) Active Problems:   Type 2 diabetes mellitus with hyperglycemia (HCC)   HTN (hypertension)   BPH (benign prostatic hyperplasia)   AKI (acute kidney injury) ([place])   Complicated UTI (urinary tract infection)   Hydronephrosis of left kidney   Congenital anomaly of ureter   SVT (supraventricular tachycardia) (HCC)   Frequent PVCs   Ureteral obstruction, left    Hospital Course   76 y.o. male with medical history significant for DM, HTN, BPH, HLD who presents to the ED with a 2-day history of bilateral flank pain and fever.  He went to see his PCP who found him to be in SVT with a rate in the 160s.  EMS attempted vagal maneuvers without improvement and patient was brought to the ED.  1/20 - Urology and cardio seen, no furthr SVT on metoprolol, urology recommends keeping foley for 1-2 weeks and outpt urology f/up  * Severe sepsis ([place])- (present on admission) Due to urinary source. Treated with iv Rocephin while in the Hospital and D/Ced on PO Abx  SVT (supraventricular tachycardia) ([place]) Controlled with metoprolol, this is likely due to urine infection/sepsis  Hydronephrosis of left kidney- (present on admission) Foley for 1-2 weeks per urology and office f/up. Repeat US as an outpt  AKI (acute kidney injury) ([place])- (present on admission) likely post-obstructive, improving with foley placement. Recheck as an outpt  BPH (benign prostatic hyperplasia)- (present on admission) Keep foley for 1-2 weeks per urology and outpt urology f/up for further intervention  HTN (hypertension) Controlled with metoprolol  Type 2 diabetes mellitus with  hyperglycemia ([place])- (present on admission) Resume home regimen        Consultants: Urology & cardio Disposition: Home Diet recommendation: Carb modified diet  DISCHARGE MEDICATION: Allergies as of 03/19/2021       Reactions   Ozempic (0.25 Or 0.5 Mg-dose) [semaglutide(0.25 Or 0.5mg -dos)] Swelling        Medication List     TAKE these medications    aspirin EC 81 MG tablet Take 81 mg by mouth daily. Swallow whole.   atorvastatin 20 MG tablet Commonly known as: LIPITOR Take 1 tablet (20 mg total) by mouth daily.   benazepril 40 MG tablet Commonly known as: LOTENSIN Take 1 tablet (40 mg total) by mouth daily.   Centrum Silver 50+Men Tabs Take 1 tablet by mouth daily.   cephALEXin 250 MG capsule Commonly known as: KEFLEX Take 1 capsule (250 mg total) by mouth 2 (two) times daily for 12 days.   empagliflozin 10 MG Tabs tablet Commonly known as: Jardiance Take 1 tablet (10 mg total) by mouth daily before breakfast.   Insulin Pen Needle 31G X 8 MM Misc Use one needle to administer insulin into skin nightly.   Levemir FlexTouch 100 UNIT/ML FlexPen Generic drug: insulin detemir Inject 35 units into the skin at bedtime. What changed:  how much to take how to take this when to take this additional instructions   lidocaine 5 % Commonly known as: LIDODERM Place 1 patch onto the skin daily. Remove & Discard patch within 12 hours or as directed by MD What changed:  when to take this  reasons to take this   metFORMIN 1000 MG tablet Commonly known as: GLUCOPHAGE Take 1 tablet (1,000 mg total) by mouth 2 (two) times daily with a meal.   metoprolol tartrate 25 MG tablet Commonly known as: LOPRESSOR Take 0.5 tablets (12.5 mg total) by mouth daily.   vitamin B-12 500 MCG tablet Commonly known as: CYANOCOBALAMIN Take 500 mcg by mouth daily.        Follow-up Information     [name], Avanti, MD. Schedule an appointment as soon as possible for a visit in 1  week(s).   Specialty: Internal Medicine Why: [facility] Discharge F/UP Contact information: [place] [address] [phone]         Billey Co, MD. Schedule an appointment as soon as possible for a visit in 2 week(s).   Specialty: Urology Why: [facility] Discharge F/UP Contact information: [place] [address] [phone]                 Discharge Exam: Danley Danker Weights   03/17/21 1609  Weight: 108.9 kg   Constitutional: Alert, oriented x 3 . Not in any apparent distress HEENT:      Head: Normocephalic and atraumatic.         Eyes: PERLA, EOMI, Conjunctivae are normal. Sclera is non-icteric.       Mouth/Throat: Mucous membranes are moist.       Neck: Supple with no signs of meningismus. Cardiovascular: Regular rate and rhythm. No murmurs, gallops, or rubs. 2+ symmetrical distal pulses are present . No JVD. No  LE edema Respiratory: Respiratory effort normal .Lungs sounds clear bilaterally. No wheezes, crackles, or rhonchi.  Gastrointestinal: Soft, non tender, non distended. Positive bowel sounds.  Genitourinary: Foley draining clear urine Musculoskeletal: Nontender with normal range of motion in all extremities. No cyanosis, or erythema of extremities. Neurologic:  Face is symmetric. Moving all extremities. No gross focal neurologic deficits . Skin: Skin is warm, dry.  No rash or ulcers Psychiatric: Mood and affect are appropriate   Condition at discharge: good  The results of significant diagnostics from this hospitalization (including imaging, microbiology, ancillary and laboratory) are listed below for reference.   Imaging Studies: CT ABDOMEN PELVIS W CONTRAST  Result Date: 03/17/2021 CLINICAL DATA:  Abdominal pain, EXAM: CT ABDOMEN AND PELVIS WITH CONTRAST TECHNIQUE: Multidetector CT imaging of the abdomen and pelvis was performed using the standard protocol following bolus administration of intravenous contrast.  RADIATION DOSE REDUCTION: This exam was performed according to the departmental dose-optimization program which includes automated exposure control, adjustment of the mA and/or kV according to patient size and/or use of iterative reconstruction technique. CONTRAST:  60mL OMNIPAQUE IOHEXOL 300 MG/ML  SOLN COMPARISON:  None. FINDINGS: Lower chest: Lung bases are clear. Hepatobiliary: Gallstones present without acute cholecystitis. No biliary duct dilatation. Pancreas: Pancreas is normal. No ductal dilatation. No pancreatic inflammation. Spleen: Normal spleen Adrenals/urinary tract: Adrenal glands normal. Hydronephrosis and hydroureter of the LEFT renal collecting system. The LEFT ureter is ectatic. Hydroureter extends into the pelvis. Atypical insertion of the LEFT ureter in the LEFT lateral aspect of the bladder (image 74/2). No obstructing lesion identified. No excretion of contrast on delayed imaging (series 7). RIGHT kidney normal.  No hydronephrosis or hydroureter. Several diverticula of the bladder.  No Asymmetric soft tissue at the base the bladder is favored prostate tissue (image 81/2. Stomach/Bowel: Stomach, small bowel, appendix, and cecum are normal. The colon and rectosigmoid colon are normal. Vascular/Lymphatic: Abdominal aorta  is normal caliber with atherosclerotic calcification. There is no retroperitoneal or periportal lymphadenopathy. No pelvic lymphadenopathy. Reproductive: Prostate enlarged. Other: No free fluid. Musculoskeletal: No aggressive osseous lesion. IMPRESSION: 1. Moderate LEFT hydronephrosis and hydroureter. No excretion of contrast on delayed imaging. LEFT hydroureter extends to the level of the bladder. Atypical lateral insertion site of the ureter/vesicoureteral junction. No obstructing lesion identified. 2. Several small bladder diverticula. 3. Prostate hypertrophy. 4. RIGHT kidney normal. 5. Cholelithiasis without cholecystitis. Electronically Signed   By: Suzy Bouchard M.D.    On: 03/17/2021 18:16   US RENAL  Result Date: 03/18/2021 CLINICAL DATA:  Left hydronephrosis EXAM: RENAL / URINARY TRACT ULTRASOUND COMPLETE COMPARISON:  CT examination dated March 17, 2021 FINDINGS: Right Kidney: Renal measurements: 12.7 x 6.5 x 5.8 = volume: 249 mL. Echogenicity within normal limits. No mass or hydronephrosis visualized. Left Kidney: Renal measurements: 12.6 x 5.8 x 5.8 = volume: 220 mL. Echogenicity within normal limits. No appreciable mass or calculus. Moderate left hydronephrosis and dilated proximal left ureter. Bladder: Urinary bladder is collapsed about the Foley's catheter Other: None. IMPRESSION: 1. Moderate left hydroureteronephrosis without evidence of calculus. The findings may be secondary to abnormal insertion of the left ureter into the urinary bladder, as seen on the CT examination. CT urogram and urology consultation for further management is recommended. 2. The right kidney and ureter are unremarkable. Urinary bladder is collapsed about the Foley's catheter limiting elevation. Electronically Signed   By: Keane Police D.O.   On: 03/18/2021 10:09   DG Chest Portable 1 View  Result Date: 03/17/2021 CLINICAL DATA:  Tachycardia, fever for the last 2 days EXAM: PORTABLE CHEST 1 VIEW COMPARISON:  03/17/2021 FINDINGS: The heart size and mediastinal contours are within normal limits. Both lungs are clear. The visualized skeletal structures are unremarkable. IMPRESSION: No active disease. Electronically Signed   By: Kathreen Devoid M.D.   On: 03/17/2021 16:23    Microbiology: Results for orders placed or performed during the hospital encounter of 03/17/21  Resp Panel by RT-PCR (Flu A&B, Covid) Nasopharyngeal Swab     Status: None   Collection Time: 03/17/21  4:19 PM   Specimen: Nasopharyngeal Swab; Nasopharyngeal(NP) swabs in vial transport medium  Result Value Ref Range Status   SARS Coronavirus 2 by RT PCR NEGATIVE NEGATIVE Final    Comment: (NOTE) SARS-CoV-2 target  nucleic acids are NOT DETECTED.  The SARS-CoV-2 RNA is generally detectable in upper respiratory specimens during the acute phase of infection. The lowest concentration of SARS-CoV-2 viral copies this assay can detect is 138 copies/mL. A negative result does not preclude SARS-Cov-2 infection and should not be used as the sole basis for treatment or other patient management decisions. A negative result may occur with  improper specimen collection/handling, submission of specimen other than nasopharyngeal swab, presence of viral mutation(s) within the areas targeted by this assay, and inadequate number of viral copies(<138 copies/mL). A negative result must be combined with clinical observations, patient history, and epidemiological information. The expected result is Negative.  Fact Sheet for Patients:  EntrepreneurPulse.com.au  Fact Sheet for Healthcare Providers:  IncredibleEmployment.be  This test is no t yet approved or cleared by the [place] FDA and  has been authorized for detection and/or diagnosis of SARS-CoV-2 by FDA under an Emergency Use Authorization (EUA). This EUA will remain  in effect (meaning this test can be used) for the duration of the COVID-19 declaration under Section 564(b)(1) of the Act, 21 U.S.C.section 360bbb-3(b)(1), unless the authorization is terminated  or revoked sooner.       Influenza A by PCR NEGATIVE NEGATIVE Final   Influenza B by PCR NEGATIVE NEGATIVE Final    Comment: (NOTE) The Xpert Xpress SARS-CoV-2/FLU/RSV plus assay is intended as an aid in the diagnosis of influenza from Nasopharyngeal swab specimens and should not be used as a sole basis for treatment. Nasal washings and aspirates are unacceptable for Xpert Xpress SARS-CoV-2/FLU/RSV testing.  Fact Sheet for Patients: EntrepreneurPulse.com.au  Fact Sheet for Healthcare  Providers: IncredibleEmployment.be  This test is not yet approved or cleared by the [place] FDA and has been authorized for detection and/or diagnosis of SARS-CoV-2 by FDA under an Emergency Use Authorization (EUA). This EUA will remain in effect (meaning this test can be used) for the duration of the COVID-19 declaration under Section 564(b)(1) of the Act, 21 U.S.C. section 360bbb-3(b)(1), unless the authorization is terminated or revoked.  Performed at [facility], [address] [place], [place] [address]   Urine Culture     Status: Abnormal   Collection Time: 03/17/21  6:44 PM   Specimen: Urine, Clean Catch  Result Value Ref Range Status   Specimen Description   Final    URINE, CLEAN CATCH Performed at [facility], [address] [place], [place] [address]    Special Requests   Final    NONE Performed at [facility], [place], [place] [address]    Culture 80,000 COLONIES/mL STAPHYLOCOCCUS EPIDERMIDIS (A)  Final   Report Status 03/20/2021 FINAL  Final   Organism ID, Bacteria STAPHYLOCOCCUS EPIDERMIDIS (A)  Final      Susceptibility   Staphylococcus epidermidis - MIC*    CIPROFLOXACIN <=0.5 SENSITIVE Sensitive     GENTAMICIN <=0.5 SENSITIVE Sensitive     NITROFURANTOIN <=16 SENSITIVE Sensitive     OXACILLIN <=0.25 SENSITIVE Sensitive     TETRACYCLINE <=1 SENSITIVE Sensitive     VANCOMYCIN 2 SENSITIVE Sensitive     TRIMETH/SULFA <=10 SENSITIVE Sensitive     CLINDAMYCIN <=0.25 SENSITIVE Sensitive     RIFAMPIN <=0.5 SENSITIVE Sensitive     Inducible Clindamycin NEGATIVE Sensitive     * 80,000 COLONIES/mL STAPHYLOCOCCUS EPIDERMIDIS    Labs: CBC: Recent Labs  Lab 03/17/21 1613 03/18/21 0609 03/19/21 0713  WBC 20.2* 14.5* 11.4*  NEUTROABS 16.8*  --   --   HGB 13.2 11.6* 11.0*  HCT 37.4* 34.1* 32.9*  MCV 86.2 87.4 88.0  PLT 245 233 878   Basic Metabolic Panel: Recent Labs  Lab  03/17/21 1613 03/18/21 0609 03/19/21 0713  NA 131* 132* 137  K 3.6 4.2 4.0  CL 97* 102 102  CO2 20* 23 24  GLUCOSE 214* 163* 151*  BUN 26* 30* 22  CREATININE 1.70* 1.61* 1.36*  CALCIUM 8.3* 7.5* 7.7*  MG 1.7  --   --    Liver Function Tests: Recent Labs  Lab 03/17/21 1613  AST 18  ALT 24  ALKPHOS 67  BILITOT 1.0  PROT 7.1  ALBUMIN 3.4*   CBG: Recent Labs  Lab 03/18/21 1631 03/18/21 1947 03/18/21 2357 03/19/21 0428 03/19/21 0730  GLUCAP 356* 249* 153* 151* 139*    Discharge time spent: greater than 30 minutes.  Signed: Max Sane, MD [facility] 03/20/2021

## 2021-03-21 DIAGNOSIS — M545 Low back pain, unspecified: Secondary | ICD-10-CM | POA: Insufficient documentation

## 2021-03-21 DIAGNOSIS — R739 Hyperglycemia, unspecified: Secondary | ICD-10-CM | POA: Insufficient documentation

## 2021-03-22 ENCOUNTER — Telehealth: Payer: Self-pay | Admitting: *Deleted

## 2021-03-22 NOTE — Telephone Encounter (Signed)
Transition Care Management Unsuccessful Follow-up Telephone Call  Date of discharge and from where:  [facility] 03-20-2021  Attempts:  1st Attempt  Reason for unsuccessful TCM follow-up call:  Left voice message    Patient is schedule for a hospital follow up

## 2021-03-23 ENCOUNTER — Ambulatory Visit: Payer: Medicare HMO | Admitting: Internal Medicine

## 2021-03-23 ENCOUNTER — Ambulatory Visit: Payer: Medicare HMO | Admitting: Urology

## 2021-03-23 ENCOUNTER — Other Ambulatory Visit: Payer: Self-pay | Admitting: Urology

## 2021-03-23 ENCOUNTER — Other Ambulatory Visit: Payer: Self-pay

## 2021-03-23 ENCOUNTER — Encounter: Payer: Self-pay | Admitting: Urology

## 2021-03-23 VITALS — BP 118/71 | HR 87 | Ht 72.0 in | Wt 238.0 lb

## 2021-03-23 DIAGNOSIS — N138 Other obstructive and reflux uropathy: Secondary | ICD-10-CM

## 2021-03-23 DIAGNOSIS — N401 Enlarged prostate with lower urinary tract symptoms: Secondary | ICD-10-CM | POA: Diagnosis not present

## 2021-03-23 DIAGNOSIS — N1330 Unspecified hydronephrosis: Secondary | ICD-10-CM

## 2021-03-23 NOTE — Patient Instructions (Signed)

## 2021-03-23 NOTE — Progress Notes (Signed)
° ° °  03/23/2021 11:27 AM   Matthew [name] 1945-08-03 [id number]  Reason for visit: Follow up left-sided flank pain, pyelonephritis, BPH  HPI: 76 year old male who presented to the ER last week with 2 to 3 days of abdominal and back pain, long history of weak urinary stream and difficulty urinating, and evidence of left-sided pyelonephritis.  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  Foley catheter was replaced and he was started on antibiotics which completely resolved his left-sided flank pain.  He had significant postobstructive diuresis.  PSA in the past was normal at 1.1 in July 2022.  Renal function improved significantly after Foley catheter placement.  He is tolerating the catheter well.  He is doing well with the catheter in place and denies any flank pain.  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    We discussed options at length including cystoscopy, cystoscopy with left retrograde pyelogram, HOLEP, or trial of medical management like Flomax for his suspected BPH with a voiding trial.  He would like to pursue more definitive management with HOLEP.  I anticipate this will resolve his left-sided reflux, and we will plan to perform a left retrograde pyelogram at the time of the procedure.  We discussed possible need for additional procedures pending those findings.  We discussed the risks and benefits of HoLEP at length.  The procedure requires general anesthesia and takes 1 to 2 hours, and a holmium laser is used to enucleate the prostate and push this tissue into the bladder.  A morcellator is then used to remove this tissue, which is  sent for pathology.  The vast majority(>95%) of patients are able to discharge the same day with a catheter in place for 2 to 3 days, and will follow-up in clinic for a voiding trial.  We specifically discussed the risks of bleeding, infection, retrograde ejaculation, temporary urgency and urge incontinence, very low risk of long-term incontinence, urethral stricture/bladder neck contracture, pathologic evaluation of prostate tissue and possible detection of prostate cancer or other malignancy, and possible need for additional procedures.  Schedule HOLEP with left retrograde pyelogram   Billey Co, [place] [address], [place], [place] [address] [phone]

## 2021-03-23 NOTE — Progress Notes (Signed)
Surgical Physician Order Form [facility] [place]  * Scheduling expectation :  Friday, 04/01/2021  *Length of Case: 2 hours  *Clearance needed: yes-seen by Dr. Nehemiah Massed as an inpatient recently, please just reach out to his office for clearance, I do not think he needs to be seen  *Anticoagulation Instructions: Hold all anticoagulants  *Aspirin Instructions: Hold Aspirin  *Post-op visit Date/Instructions:  1 to 3-day Foley removal, 12 weeks MD PVR  *Diagnosis: BPH w/urinary obstruction  *Procedure: HOLEP, left retrograde pyelogram   Additional orders: N/A  -Admit type: OUTpatient  -Anesthesia: General  -VTE Prophylaxis Standing Order SCDs       Other:   -Standing Lab Orders Per Anesthesia    Lab other: None  -Standing Test orders EKG/Chest x-ray per Anesthesia       Test other:   - Medications:  Cipro 400mg  IV  -Other orders:  N/A

## 2021-03-25 ENCOUNTER — Telehealth: Payer: Self-pay

## 2021-03-25 ENCOUNTER — Telehealth: Payer: Self-pay | Admitting: Urgent Care

## 2021-03-25 NOTE — Progress Notes (Addendum)
°  Perioperative Services Pre-Admission/Anesthesia Testing     Date: 03/25/21  Name: Matthew [name] MRN:   [id number]  Re: Request from surgery for clearance prior to scheduled procedure  Patient is scheduled to undergo a 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; CYSTOSCOPY WITH RETROGRADE PYELOGRAM on 04/01/2021 with Dr. Nickolas Madrid, MD. Patient has not been scheduled for his PAT appointment at this point, thus has not undergone review by PAT RN and/or APP. Received communication from primary attending surgeon's office requesting that patient be submitted for clearance from CARDIOLOGY.   PROVIDER SPECIALTY FAXED TO   Serafina Royals, MD  Cardiology  [phone]   Plan:  Clearance documents generated and faxed to appropriate provider(s) as noted above. Note will be updated to reflect communication with provider's office as it relates to clearance being provided and/or the need for office visit prior to clearance for surgery being issued.   ADDENDUM 03/28/2021 1500 PM  - We have been working to get patient scheduled with cardiology for an outpatient hospital follow up appointment following recent admission. When speaking with cardiology, the earliest appointment with Dr. Nehemiah Massed is on 04/11/2021. Case discussed with both surgeon Diamantina Providence, MD) and anesthesia Amie Critchley, MD) who both feel as if the SVT that patient experienced during his admission was secondary to his urosepsis diagnosis. Patient started on low dose beta blocker therapy and has had no recurrent symptoms. Given no perceived tachycardia, CP, palpitations, or SOB, his surgical team feels that patient would be appropriate to proceed with urological procedure as planned. Patient has been made aware. He has been encouraged to keep scheduled appointment with Dr. Nehemiah Massed on 04/11/2021 at 10 AM as already scheduled.   Honor Loh, MSN, APRN, FNP-C, CEN [facility]  Peri-operative Services Nurse  Practitioner Phone: [phone] 03/25/21 11:43 AM  NOTE: This note has been prepared using Dragon dictation software. Despite my best ability to proofread, there is always the potential that unintentional transcriptional errors may still occur from this process.

## 2021-03-25 NOTE — Progress Notes (Signed)
[facility] Posting Form   Surgery Date/Time: Date: 04/01/2021  Surgeon: Dr. Nickolas Madrid, MD  Surgery Location: Day Surgery  Inpt ( No  )   Outpt (Yes)   Obs ( No  )   Diagnosis: Benign Prostatic Hyperplasia with Urinary Obstruction N40.1, N13.8  -CPT: 60677, [phone]  Surgery: Holmium Enucleation of the prostate and Left retrograde pyelogram  Stop Anticoagulations: Yes, hold asa  Cardiac/Medical/Pulmonary Clearance needed: yes  Clearance needed from Dr: Nehemiah Massed  Clearance request sent on: Date: 03/25/21   *Orders entered into EPIC  Date: 03/25/21   *Case booked in EPIC  Date: 03/25/21  *Notified pt of Surgery: Date: 03/23/2021  PRE-OP UA & CX: no  *Placed into Prior Authorization Work [place] Date: 03/25/21   Assistant/laser/rep:No

## 2021-03-25 NOTE — Telephone Encounter (Signed)
I spoke with [name] and his wife in clinic. We have discussed possible surgery dates and Friday February 3rd, 2023 was agreed upon by all parties. Patient given information about surgery date, what to expect pre-operatively and post operatively.   We discussed that a Pre-Admission Testing office will be calling to set up the pre-op visit that will take place prior to surgery, and that these appointments are typically done over the phone with a Pre-Admissions RN.   Informed patient that our office will communicate any additional care to be provided after surgery. Patients questions or concerns were discussed during our call. Advised to call our office should there be any additional information, questions or concerns that arise. Patient verbalized understanding.

## 2021-03-28 ENCOUNTER — Telehealth: Payer: Self-pay | Admitting: Internal Medicine

## 2021-03-28 NOTE — Telephone Encounter (Signed)
Copied from [place] [phone]. Topic: General - Inquiry >> Mar 28, 2021  3:46 PM Oneta Rack wrote: Reason for CRM: patient wife would like to know does PCP want him to have labs down prior to 03/31/2021 hospital follow up appointment.

## 2021-03-28 NOTE — Telephone Encounter (Signed)
LVM for patient's wife to inform her that he does not need labs prior to office visit on 2/2

## 2021-03-29 ENCOUNTER — Other Ambulatory Visit: Payer: Self-pay

## 2021-03-29 ENCOUNTER — Encounter
Admission: RE | Admit: 2021-03-29 | Discharge: 2021-03-29 | Disposition: A | Discharge status: Home / Self Care | Payer: Medicare HMO | Source: Ambulatory Visit | Attending: Urology | Admitting: Urology

## 2021-03-29 NOTE — Progress Notes (Signed)
°  Perioperative Services Pre-Admission/Anesthesia Testing   Date: 03/29/21 Name: [name] MRN:   [id number]  Re: Consideration of preoperative prophylactic antibiotic change   Request sent to: Billey Co, MD (routed and/or faxed via [facility])  Planned Surgical Procedure(s):    Case: [id number] Date/Time: 04/01/21 1054   Procedures:      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     CYSTOSCOPY WITH RETROGRADE PYELOGRAM (Left)   Anesthesia type: General   Pre-op diagnosis: Benign Prostatic Hyperplasia with Urinary Obstruction   Location: [facility] OR ROOM 10 / [facility]   Surgeons: Billey Co, MD   Clinical Notes:  Patient has no documented allergy to PCN   Request:  As an evidence based approach to reducing the rate of incidence for post-operative SSI and the development of MDROs, could an agent with narrower coverage for preoperative prophylaxis in this patient's upcoming surgical course be considered?   Currently ordered preoperative prophylactic ABX: ciprofloxacin.   Specifically requesting change to cephalosporin (CEFAZOLIN).   Please communicate decision with me and I will change the orders in Epic as per your direction.      Citation: Beckie Salts, [place] Heath et al: Best practice statement on urologic procedures and antimicrobial prophylaxis. J Urol 2020; 203: 351.   Honor Loh, MSN, APRN, FNP-C, CEN [facility]  Peri-operative Services Nurse Practitioner FAX: [phone] 03/29/21 12:02 PM

## 2021-03-29 NOTE — Patient Instructions (Addendum)
Your procedure is scheduled on: Friday, February 3 Report to the Registration Desk on the 1st floor of the [facility]. To find out your arrival time, please call [phone] between 1PM - 3PM on: Thursday, February 2  REMEMBER: Instructions that are not followed completely may result in serious medical risk, up to and including death; or upon the discretion of your surgeon and anesthesiologist your surgery may need to be rescheduled.  Do not eat or drink after midnight the night before surgery.  No gum chewing, lozengers or hard candies.  TAKE THESE MEDICATIONS THE MORNING OF SURGERY WITH A SIP OF WATER:  Atorvastatin Metoprolol  Stop metformin 2 days prior to surgery. Last day to take Metformin is Tuesday, January 31. Resume AFTER surgery.  Stop Jardiance 3 days prior to surgery. Last day to take Jardiance is Monday, January 30. Resume AFTER surgery.  Only take 1/2 dose of Levemir the night before surgery. Only take 17 units on Thursday evening.  One week prior to surgery:  Stop aspirin and Anti-inflammatories (NSAIDS) such as Advil, Aleve, Ibuprofen, Motrin, Naproxen, Naprosyn and Aspirin based products such as Excedrin, Goodys Powder, BC Powder. Stop ANY OVER THE COUNTER supplements until after surgery. Stop multiple vitamin. You may however, continue to take Tylenol if needed for pain up until the day of surgery.  No Alcohol for 24 hours before or after surgery.  No Smoking including e-cigarettes for 24 hours prior to surgery.  No chewable tobacco products for at least 6 hours prior to surgery.  No nicotine patches on the day of surgery.  Do not use any "recreational" drugs for at least a week prior to your surgery.  Please be advised that the combination of cocaine and anesthesia may have negative outcomes, up to and including death. If you test positive for cocaine, your surgery will be cancelled.  On the morning of surgery brush your teeth with toothpaste and water,  you may rinse your mouth with mouthwash if you wish. Do not swallow any toothpaste or mouthwash.  Do not wear jewelry, make-up, hairpins, clips or nail polish.  Do not wear lotions, powders, or perfumes.   Do not shave body from the neck down 48 hours prior to surgery just in case you cut yourself which could leave a site for infection.  Also, freshly shaved skin may become irritated if using the CHG soap.  Contact lenses, hearing aids and dentures may not be worn into surgery.  Do not bring valuables to the hospital. [facility] is not responsible for any missing/lost belongings or valuables.   Notify your doctor if there is any change in your medical condition (cold, fever, infection).  Wear comfortable clothing (specific to your surgery type) to the hospital.  After surgery, you can help prevent lung complications by doing breathing exercises.  Take deep breaths and cough every 1-2 hours. Your doctor may order a device called an Incentive Spirometer to help you take deep breaths.  If you are being discharged the day of surgery, you will not be allowed to drive home. You will need a responsible adult (18 years or older) to drive you home and stay with you that night.   If you are taking public transportation, you will need to have a responsible adult (18 years or older) with you. Please confirm with your physician that it is acceptable to use public transportation.   Please call the [facility] at [phone] if you have any questions about these instructions.  Surgery Visitation Policy:  Patients undergoing a surgery or procedure may have one family member or support person with them as long as that person is not COVID-19 positive or experiencing its symptoms.  That person may remain in the waiting area during the procedure and may rotate out with other people.

## 2021-03-31 ENCOUNTER — Other Ambulatory Visit: Payer: Self-pay

## 2021-03-31 ENCOUNTER — Encounter: Payer: Self-pay | Admitting: Internal Medicine

## 2021-03-31 ENCOUNTER — Ambulatory Visit (INDEPENDENT_AMBULATORY_CARE_PROVIDER_SITE_OTHER): Payer: Medicare HMO | Admitting: Internal Medicine

## 2021-03-31 ENCOUNTER — Ambulatory Visit
Admission: RE | Admit: 2021-03-31 | Discharge: 2021-03-31 | Disposition: A | Discharge status: Home / Self Care | Payer: Medicare HMO | Source: Ambulatory Visit | Attending: Internal Medicine | Admitting: Internal Medicine

## 2021-03-31 ENCOUNTER — Ambulatory Visit
Admission: RE | Admit: 2021-03-31 | Discharge: 2021-03-31 | Disposition: A | Discharge status: Home / Self Care | Payer: Medicare HMO | Attending: Internal Medicine | Admitting: Internal Medicine

## 2021-03-31 VITALS — BP 120/72 | HR 85 | Temp 98.2°F | Ht 69.69 in | Wt 238.0 lb

## 2021-03-31 DIAGNOSIS — M7989 Other specified soft tissue disorders: Secondary | ICD-10-CM

## 2021-03-31 DIAGNOSIS — Z0389 Encounter for observation for other suspected diseases and conditions ruled out: Secondary | ICD-10-CM | POA: Diagnosis not present

## 2021-03-31 MED ORDER — FEXOFENADINE HCL 180 MG PO TABS: 180.0000 mg | ORAL_TABLET | Freq: Every day | ORAL | 1 refills | Status: DC

## 2021-03-31 NOTE — Progress Notes (Signed)
BP 120/72    Pulse 85    Temp 98.2 F (36.8 C) (Oral)    Ht 5' 9.69" (1.77 m)    Wt 238 lb (108 kg)    SpO2 97%    BMI 34.46 kg/m    Subjective:    Patient ID: Matthew [name], male    DOB: 1945-11-03, 76 y.o.   MRN: [id number]  Chief Complaint  Patient presents with   Hospitalization Follow-up    For SVT, Kidney Inf, Patient is having is surgery tomorrow for bladder and to shave his prostate    HPI: Matthew [name] is a 76 y.o. male  Pt is here for a hosp fu had SVT to be secondary to severe sepsis from possible UTI/ pyelonephritis  Patient was noted to have acute kidney injury which was improved on discharge status post catheter insertion.  To see cards for such on the 13th - per cards Dr Mabeline Caras he is cleared to have surgery. Per anesthesia notes pt to have a :  Matthew [name]     CYSTOSCOPY WITH RETROGRADE PYELOGRAM (Left) PR today was 85, denies any symptoms today no CP/ No SOB Pt says he had left flank pain got better. Has a catheter right now. Has had blood in the urine ac ouple days ago, looks clear today  COVID -ve, has a cough today.    URI  This is a new (not coughing up anything, dry cough now, no nasal d/c) problem. The current episode started yesterday. There has been no fever. Associated symptoms include coughing. Pertinent negatives include no abdominal pain, chest pain, congestion, diarrhea, dysuria, ear pain, headaches, joint pain, joint swelling, nausea, neck pain, plugged ear sensation, rash, sinus pain, sneezing, sore throat, vomiting or wheezing.  Cough This is a new problem. The current episode started in the past 7 days. Pertinent negatives include no chest pain, ear pain, fever, headaches, heartburn, hemoptysis, myalgias, nasal congestion, postnasal drip, rash, sore throat, shortness of breath, sweats, weight loss or wheezing.   Chief Complaint  Patient presents with   Hospitalization Follow-up    For SVT,  Kidney Inf, Patient is having is surgery tomorrow for bladder and to shave his prostate    Relevant past medical, surgical, family and social history reviewed and updated as indicated. Interim medical history since our last visit reviewed. Allergies and medications reviewed and updated.  Review of Systems  Constitutional:  Negative for fever and weight loss.  HENT:  Negative for congestion, ear pain, postnasal drip, sinus pain, sneezing and sore throat.   Respiratory:  Positive for cough. Negative for hemoptysis, shortness of breath and wheezing.   Cardiovascular:  Negative for chest pain.  Gastrointestinal:  Negative for abdominal pain, diarrhea, heartburn, nausea and vomiting.  Genitourinary:  Negative for dysuria.  Musculoskeletal:  Negative for joint pain, myalgias and neck pain.  Skin:  Negative for rash.  Neurological:  Negative for headaches.   Per HPI unless specifically indicated above     Objective:    BP 120/72    Pulse 85    Temp 98.2 F (36.8 C) (Oral)    Ht 5' 9.69" (1.77 m)    Wt 238 lb (108 kg)    SpO2 97%    BMI 34.46 kg/m   Wt Readings from Last 3 Encounters:  03/31/21 238 lb (108 kg)  03/29/21 238 lb (108 kg)  03/23/21 238 lb (108 kg)    Physical Exam Vitals and nursing  note reviewed.  Constitutional:      General: He is not in acute distress.    Appearance: Normal appearance. He is not ill-appearing or diaphoretic.  HENT:     Head: Normocephalic and atraumatic.     Right Ear: Tympanic membrane and external ear normal. There is no impacted cerumen.     Left Ear: External ear normal.     Nose: No congestion or rhinorrhea.     Mouth/Throat:     Pharynx: No oropharyngeal exudate or posterior oropharyngeal erythema.  Eyes:     Conjunctiva/sclera: Conjunctivae normal.     Pupils: Pupils are equal, round, and reactive to light.  Cardiovascular:     Rate and Rhythm: Normal rate and regular rhythm.     Heart sounds: No murmur heard.   No friction rub. No  gallop.  Pulmonary:     Effort: No respiratory distress.     Breath sounds: No stridor. Rhonchi present. No wheezing.       Comments: Bibasilar crackles noted on exam.  Chest:     Chest wall: No tenderness.  Musculoskeletal:        General: Swelling present. No tenderness, deformity or signs of injury.     Cervical back: Normal range of motion and neck supple. No rigidity or tenderness.     Right lower leg: Edema present.     Left lower leg: Edema present.  Skin:    General: Skin is warm and dry.  Neurological:     Mental Status: He is alert.    Results for orders placed or performed during the hospital encounter of 03/17/21  Resp Panel by RT-PCR (Flu A&B, Covid) Nasopharyngeal Swab   Specimen: Nasopharyngeal Swab; Nasopharyngeal(NP) swabs in vial transport medium  Result Value Ref Range   SARS Coronavirus 2 by RT PCR NEGATIVE NEGATIVE   Influenza A by PCR NEGATIVE NEGATIVE   Influenza B by PCR NEGATIVE NEGATIVE  Urine Culture   Specimen: Urine, Clean Catch  Result Value Ref Range   Specimen Description      URINE, CLEAN CATCH Performed at [facility], [address] [place], [place] [address]    Special Requests      NONE Performed at [facility], [address] [place],  [address]    Culture 80,000 COLONIES/mL STAPHYLOCOCCUS EPIDERMIDIS (A)    Report Status 03/20/2021 FINAL    Organism ID, Bacteria STAPHYLOCOCCUS EPIDERMIDIS (A)       Susceptibility   Staphylococcus epidermidis - MIC*    CIPROFLOXACIN <=0.5 SENSITIVE Sensitive     GENTAMICIN <=0.5 SENSITIVE Sensitive     NITROFURANTOIN <=16 SENSITIVE Sensitive     OXACILLIN <=0.25 SENSITIVE Sensitive     TETRACYCLINE <=1 SENSITIVE Sensitive     VANCOMYCIN 2 SENSITIVE Sensitive     TRIMETH/SULFA <=10 SENSITIVE Sensitive     CLINDAMYCIN <=0.25 SENSITIVE Sensitive     RIFAMPIN <=0.5 SENSITIVE Sensitive     Inducible Clindamycin NEGATIVE Sensitive     * 80,000 COLONIES/mL  STAPHYLOCOCCUS EPIDERMIDIS  Comprehensive metabolic panel  Result Value Ref Range   Sodium 131 (L) 135 - 145 mmol/L   Potassium 3.6 3.5 - 5.1 mmol/L   Chloride 97 (L) 98 - 111 mmol/L   CO2 20 (L) 22 - 32 mmol/L   Glucose, Bld 214 (H) 70 - 99 mg/dL   BUN 26 (H) 8 - 23 mg/dL   Creatinine, Ser 1.70 (H) 0.61 - 1.24 mg/dL   Calcium 8.3 (L) 8.9 - 10.3 mg/dL  Total Protein 7.1 6.5 - 8.1 g/dL   Albumin 3.4 (L) 3.5 - 5.0 g/dL   AST 18 15 - 41 U/L   ALT 24 0 - 44 U/L   Alkaline Phosphatase 67 38 - 126 U/L   Total Bilirubin 1.0 0.3 - 1.2 mg/dL   GFR, Estimated 42 (L) >60 mL/min   Anion gap 14 5 - 15  Lipase, blood  Result Value Ref Range   Lipase 44 11 - 51 U/L  Ethanol  Result Value Ref Range   Alcohol, Ethyl (B) <10 <10 mg/dL  CBC with Differential  Result Value Ref Range   WBC 20.2 (H) 4.0 - 10.5 K/uL   RBC 4.34 4.22 - 5.81 MIL/uL   Hemoglobin 13.2 13.0 - 17.0 g/dL   HCT 37.4 (L) 39.0 - 52.0 %   MCV 86.2 80.0 - 100.0 fL   MCH 30.4 26.0 - 34.0 pg   MCHC 35.3 30.0 - 36.0 g/dL   RDW 13.0 11.5 - 15.5 %   Platelets 245 150 - 400 K/uL   nRBC 0.0 0.0 - 0.2 %   Neutrophils Relative % 83 %   Neutro Abs 16.8 (H) 1.7 - 7.7 K/uL   Lymphocytes Relative 4 %   Lymphs Abs 0.9 0.7 - 4.0 K/uL   Monocytes Relative 12 %   Monocytes Absolute 2.3 (H) 0.1 - 1.0 K/uL   Eosinophils Relative 0 %   Eosinophils Absolute 0.0 0.0 - 0.5 K/uL   Basophils Relative 0 %   Basophils Absolute 0.0 0.0 - 0.1 K/uL   Immature Granulocytes 1 %   Abs Immature Granulocytes 0.18 (H) 0.00 - 0.07 K/uL  TSH  Result Value Ref Range   TSH 1.490 0.350 - 4.500 uIU/mL  T4, free  Result Value Ref Range   Free T4 1.03 0.61 - 1.12 ng/dL  Magnesium  Result Value Ref Range   Magnesium 1.7 1.7 - 2.4 mg/dL  Urinalysis, Routine w reflex microscopic  Result Value Ref Range   Color, Urine YELLOW (A) YELLOW   APPearance HAZY (A) CLEAR   Specific Gravity, Urine 1.026 1.005 - 1.030   pH 5.0 5.0 - 8.0   Glucose, UA >=500 (A)  NEGATIVE mg/dL   Hgb urine dipstick MODERATE (A) NEGATIVE   Bilirubin Urine NEGATIVE NEGATIVE   Ketones, ur 5 (A) NEGATIVE mg/dL   Protein, ur NEGATIVE NEGATIVE mg/dL   Nitrite POSITIVE (A) NEGATIVE   Leukocytes,Ua MODERATE (A) NEGATIVE   RBC / HPF 11-20 0 - 5 RBC/hpf   WBC, UA 21-50 0 - 5 WBC/hpf   Bacteria, UA NONE SEEN NONE SEEN   Squamous Epithelial / LPF 0-5 0 - 5   Mucus PRESENT    Budding Yeast PRESENT    Amorphous Crystal PRESENT   Hemoglobin A1c  Result Value Ref Range   Hgb A1c MFr Bld 8.5 (H) 4.8 - 5.6 %   Mean Plasma Glucose 197 mg/dL  Protime-INR  Result Value Ref Range   Prothrombin Time 14.5 11.4 - 15.2 seconds   INR 1.1 0.8 - 1.2  Cortisol-am, blood  Result Value Ref Range   Cortisol - AM 23.1 (H) 6.7 - 22.6 ug/dL  Procalcitonin  Result Value Ref Range   Procalcitonin 0.71 ng/mL  Basic metabolic panel  Result Value Ref Range   Sodium 132 (L) 135 - 145 mmol/L   Potassium 4.2 3.5 - 5.1 mmol/L   Chloride 102 98 - 111 mmol/L   CO2 23 22 - 32 mmol/L   Glucose,  Bld 163 (H) 70 - 99 mg/dL   BUN 30 (H) 8 - 23 mg/dL   Creatinine, Ser 1.61 (H) 0.61 - 1.24 mg/dL   Calcium 7.5 (L) 8.9 - 10.3 mg/dL   GFR, Estimated 45 (L) >60 mL/min   Anion gap 7 5 - 15  CBC  Result Value Ref Range   WBC 14.5 (H) 4.0 - 10.5 K/uL   RBC 3.90 (L) 4.22 - 5.81 MIL/uL   Hemoglobin 11.6 (L) 13.0 - 17.0 g/dL   HCT 34.1 (L) 39.0 - 52.0 %   MCV 87.4 80.0 - 100.0 fL   MCH 29.7 26.0 - 34.0 pg   MCHC 34.0 30.0 - 36.0 g/dL   RDW 13.0 11.5 - 15.5 %   Platelets 233 150 - 400 K/uL   nRBC 0.0 0.0 - 0.2 %  D-dimer, quantitative  Result Value Ref Range   D-Dimer, Quant 1.54 (H) 0.00 - 0.50 ug/mL-FEU  Glucose, capillary  Result Value Ref Range   Glucose-Capillary 188 (H) 70 - 99 mg/dL  Lactic acid, plasma  Result Value Ref Range   Lactic Acid, Venous 1.0 0.5 - 1.9 mmol/L  Lactic acid, plasma  Result Value Ref Range   Lactic Acid, Venous 0.9 0.5 - 1.9 mmol/L  Brain natriuretic peptide   Result Value Ref Range   B Natriuretic Peptide 106.5 (H) 0.0 - 100.0 pg/mL  Glucose, capillary  Result Value Ref Range   Glucose-Capillary 213 (H) 70 - 99 mg/dL  Glucose, capillary  Result Value Ref Range   Glucose-Capillary 143 (H) 70 - 99 mg/dL  Glucose, capillary  Result Value Ref Range   Glucose-Capillary 153 (H) 70 - 99 mg/dL  Glucose, capillary  Result Value Ref Range   Glucose-Capillary 137 (H) 70 - 99 mg/dL  Glucose, capillary  Result Value Ref Range   Glucose-Capillary 356 (H) 70 - 99 mg/dL  CBC  Result Value Ref Range   WBC 11.4 (H) 4.0 - 10.5 K/uL   RBC 3.74 (L) 4.22 - 5.81 MIL/uL   Hemoglobin 11.0 (L) 13.0 - 17.0 g/dL   HCT 32.9 (L) 39.0 - 52.0 %   MCV 88.0 80.0 - 100.0 fL   MCH 29.4 26.0 - 34.0 pg   MCHC 33.4 30.0 - 36.0 g/dL   RDW 13.1 11.5 - 15.5 %   Platelets 238 150 - 400 K/uL   nRBC 0.0 0.0 - 0.2 %  Basic metabolic panel  Result Value Ref Range   Sodium 137 135 - 145 mmol/L   Potassium 4.0 3.5 - 5.1 mmol/L   Chloride 102 98 - 111 mmol/L   CO2 24 22 - 32 mmol/L   Glucose, Bld 151 (H) 70 - 99 mg/dL   BUN 22 8 - 23 mg/dL   Creatinine, Ser 1.36 (H) 0.61 - 1.24 mg/dL   Calcium 7.7 (L) 8.9 - 10.3 mg/dL   GFR, Estimated 55 (L) >60 mL/min   Anion gap 11 5 - 15  Glucose, capillary  Result Value Ref Range   Glucose-Capillary 249 (H) 70 - 99 mg/dL  Glucose, capillary  Result Value Ref Range   Glucose-Capillary 153 (H) 70 - 99 mg/dL   Comment 1 Notify RN   Glucose, capillary  Result Value Ref Range   Glucose-Capillary 151 (H) 70 - 99 mg/dL   Comment 1 Notify RN   Glucose, capillary  Result Value Ref Range   Glucose-Capillary 139 (H) 70 - 99 mg/dL  Troponin I (High Sensitivity)  Result Value Ref Range  Troponin I (High Sensitivity) 14 <18 ng/L  Troponin I (High Sensitivity)  Result Value Ref Range   Troponin I (High Sensitivity) 15 <18 ng/L        Current Outpatient Medications:    atorvastatin (LIPITOR) 20 MG tablet, Take 1 tablet (20 mg  total) by mouth daily., Disp: 100 tablet, Rfl: 0   benazepril (LOTENSIN) 40 MG tablet, Take 1 tablet (40 mg total) by mouth daily., Disp: 100 tablet, Rfl: 0   empagliflozin (JARDIANCE) 10 MG TABS tablet, Take 1 tablet (10 mg total) by mouth daily before breakfast., Disp: 30 tablet, Rfl: 3   fexofenadine (ALLEGRA ALLERGY) 180 MG tablet, Take 1 tablet (180 mg total) by mouth daily., Disp: 10 tablet, Rfl: 1   insulin detemir (LEVEMIR FLEXTOUCH) 100 UNIT/ML FlexPen, Inject 35 units into the skin at bedtime. (Patient taking differently: Inject 25-30 Units into the skin at bedtime.), Disp: 15 mL, Rfl: 3   Insulin Pen Needle 31G X 8 MM MISC, Use one needle to administer insulin into skin nightly., Disp: 90 each, Rfl: 3   metFORMIN (GLUCOPHAGE) 1000 MG tablet, Take 1 tablet (1,000 mg total) by mouth 2 (two) times daily with a meal., Disp: 180 tablet, Rfl: 0   metoprolol tartrate (LOPRESSOR) 25 MG tablet, Take 0.5 tablets (12.5 mg total) by mouth daily., Disp: 15 tablet, Rfl: 0   Multiple Vitamins-Minerals (CENTRUM SILVER 50+MEN) TABS, Take 1 tablet by mouth daily., Disp: , Rfl:    vitamin B-12 (CYANOCOBALAMIN) 500 MCG tablet, Take 500 mcg by mouth daily., Disp: , Rfl:    aspirin EC 81 MG tablet, Take 81 mg by mouth daily. Swallow whole. (Patient not taking: Reported on 03/31/2021), Disp: , Rfl:    cephALEXin (KEFLEX) 250 MG capsule, Take 1 capsule (250 mg total) by mouth 2 (two) times daily for 12 days. (Patient not taking: Reported on 03/31/2021), Disp: 24 capsule, Rfl: 0  Moderate left hydroureteronephrosis without evidence of calculus. The findings may be secondary to abnormal insertion of the left ureter into the urinary bladder, as seen on the CT examination. CT urogram and urology consultation for further management is recommended.   2. The right kidney and ureter are unremarkable. Urinary bladder is collapsed about the Foley's catheter limiting elevation.  Assessment & Plan:  Patient has  bilateral lower extremity edema with bibasilar crackling noted today on exam in view of new onset of cough patient will get a stat chest x-ray probably has CHF acute onset  discussed with surgeon , patient's surgery to be rescheudled not going in for surgery as planned  tomorrow his new onset of ? CHF.    Probably needs IV Lasix versus p.o. Lasix will need to see cardiology ASAP.  Patient has been scheduled out for 3 weeks however urology to work with cardiology to have him seen sooner. Discussed with patient and his wife if symptoms get worse will need to seek medical help.  Urine looks clear no hematuria on urine collected in the catheter Patient also had a round of Cipro unsure if bleeding was infective origin  Will need to follow-up with urology and cardiology ASAP in the next course of action possibly needs echo   uncontrolled diabetes A1c of 8.5 in the hospital.  Continue current regimen will recheck with blood work in 2 weeks  SVT on metoprolol low-dose for such follow-up and management per cardiology  Problem List Items Addressed This Visit   None Visit Diagnoses     Localized swelling of both lower  extremities    -  Primary   Relevant Orders   DG Chest 2 View        Orders Placed This Encounter  Procedures   DG Chest 2 View     Meds ordered this encounter  Medications   fexofenadine (ALLEGRA ALLERGY) 180 MG tablet    Sig: Take 1 tablet (180 mg total) by mouth daily.    Dispense:  10 tablet    Refill:  1     Follow up plan: Return in about 2 weeks (around 04/14/2021).

## 2021-04-01 ENCOUNTER — Telehealth: Payer: Self-pay | Admitting: Internal Medicine

## 2021-04-01 NOTE — Telephone Encounter (Signed)
Patient made aware of results and verbalized understanding.  

## 2021-04-01 NOTE — Telephone Encounter (Signed)
Copied from [place] [phone]. Topic: General - Other >> Apr 01, 2021  9:41 AM Leward Quan A wrote: Reason for CRM: Judeen Hammans patient wife called in asking Dr Neomia Dear to give her a call with result of chest Xray done on 03/31/21 as soon as they are available please call Judeen Hammans at Ph# [phone]

## 2021-04-11 DIAGNOSIS — I480 Paroxysmal atrial fibrillation: Secondary | ICD-10-CM | POA: Diagnosis not present

## 2021-04-11 DIAGNOSIS — I10 Essential (primary) hypertension: Secondary | ICD-10-CM | POA: Diagnosis not present

## 2021-04-11 DIAGNOSIS — E782 Mixed hyperlipidemia: Secondary | ICD-10-CM | POA: Diagnosis not present

## 2021-04-12 ENCOUNTER — Telehealth: Payer: Medicare HMO

## 2021-04-12 ENCOUNTER — Ambulatory Visit (INDEPENDENT_AMBULATORY_CARE_PROVIDER_SITE_OTHER): Payer: Medicare HMO

## 2021-04-12 DIAGNOSIS — E1159 Type 2 diabetes mellitus with other circulatory complications: Secondary | ICD-10-CM

## 2021-04-12 DIAGNOSIS — E1165 Type 2 diabetes mellitus with hyperglycemia: Secondary | ICD-10-CM

## 2021-04-12 DIAGNOSIS — N400 Enlarged prostate without lower urinary tract symptoms: Secondary | ICD-10-CM

## 2021-04-12 DIAGNOSIS — M25559 Pain in unspecified hip: Secondary | ICD-10-CM

## 2021-04-12 DIAGNOSIS — M1990 Unspecified osteoarthritis, unspecified site: Secondary | ICD-10-CM

## 2021-04-12 DIAGNOSIS — I152 Hypertension secondary to endocrine disorders: Secondary | ICD-10-CM

## 2021-04-12 DIAGNOSIS — E785 Hyperlipidemia, unspecified: Secondary | ICD-10-CM

## 2021-04-12 DIAGNOSIS — Z794 Long term (current) use of insulin: Secondary | ICD-10-CM

## 2021-04-12 DIAGNOSIS — E1169 Type 2 diabetes mellitus with other specified complication: Secondary | ICD-10-CM

## 2021-04-12 DIAGNOSIS — I10 Essential (primary) hypertension: Secondary | ICD-10-CM

## 2021-04-12 NOTE — Chronic Care Management (AMB) (Signed)
Chronic Care Management   CCM RN Visit Note  04/12/2021 Name: Matthew [name] MRN: [id number] DOB: August 13, 1945  Subjective: Matthew [name] is a 76 y.o. year old male who is a primary care patient of [name], Avanti, MD. The care management team was consulted for assistance with disease management and care coordination needs.    Engaged with patient by telephone for follow up visit in response to provider referral for case management and/or care coordination services.   Consent to Services:  The patient was given information about Chronic Care Management services, agreed to services, and gave verbal consent prior to initiation of services.  Please see initial visit note for detailed documentation.   Patient agreed to services and verbal consent obtained.   Assessment: Review of patient past medical history, allergies, medications, health status, including review of consultants reports, laboratory and other test data, was performed as part of comprehensive evaluation and provision of chronic care management services.   SDOH (Social Determinants of Health) assessments and interventions performed:    CCM Care Plan  Allergies  Allergen Reactions   Ozempic (0.25 Or 0.5 Mg-Dose) [Semaglutide(0.25 Or 0.9m-Dos)] Swelling    Outpatient Encounter Medications as of 04/12/2021  Medication Sig   aspirin EC 81 MG tablet Take 81 mg by mouth daily. Swallow whole. (Patient not taking: Reported on 03/31/2021)   atorvastatin (LIPITOR) 20 MG tablet Take 1 tablet (20 mg total) by mouth daily.   benazepril (LOTENSIN) 40 MG tablet Take 1 tablet (40 mg total) by mouth daily.   empagliflozin (JARDIANCE) 10 MG TABS tablet Take 1 tablet (10 mg total) by mouth daily before breakfast.   fexofenadine (ALLEGRA ALLERGY) 180 MG tablet Take 1 tablet (180 mg total) by mouth daily.   insulin detemir (LEVEMIR FLEXTOUCH) 100 UNIT/ML FlexPen Inject 35 units into the skin at bedtime. (Patient taking differently: Inject 25-30  Units into the skin at bedtime.)   Insulin Pen Needle 31G X 8 MM MISC Use one needle to administer insulin into skin nightly.   metFORMIN (GLUCOPHAGE) 1000 MG tablet Take 1 tablet (1,000 mg total) by mouth 2 (two) times daily with a meal.   metoprolol tartrate (LOPRESSOR) 25 MG tablet Take 0.5 tablets (12.5 mg total) by mouth daily.   Multiple Vitamins-Minerals (CENTRUM SILVER 50+MEN) TABS Take 1 tablet by mouth daily.   vitamin B-12 (CYANOCOBALAMIN) 500 MCG tablet Take 500 mcg by mouth daily.   No facility-administered encounter medications on file as of 04/12/2021.    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   Diabetes mellitus without complication ([facility] 084/13/2440  Hip pain 11/09/2020   Back pain 11/09/2020   Chronic pain of both knees 11/09/2020   Stage 3a chronic kidney disease ([facility] 10/06/2020   Arthritis 10/06/2020   Morbid obesity ([facility] 07/17/2020   CKD stage 3 due to type 2 diabetes mellitus ([facility] 03/13/2020   BPH (benign prostatic hyperplasia) 12/30/2014   Type 2 diabetes mellitus with hyperglycemia (HCC)    Hyperlipidemia associated with type 2 diabetes mellitus ([facility]    HTN (  hypertension)     Conditions to be addressed/monitored:HTN, HLD, DMII, BPH, and chronic pain   Care Plan : RNCM: Management of HTN, HLD, DM2, and Chronic pain in bilateral knees and hips  Updates made by Vanita Ingles, RN since 04/12/2021 12:00 AM     Problem: RNCM: Management of HTN, HLD, DM2, and Chronic pain in bilateral knees and hips    Priority: High     Long-Range Goal: RNCM: Management of HTN, HLD, DM2, and Chronic pain in bilateral knees and hips   Start Date: 10/01/2020  Expected End Date: 10/01/2021  Recent Progress: On track  Priority: High  Note:   Current Barriers:  Knowledge Deficits related to plan of care for management of HTN, HLD, DMII, and Chronic pain   Care Coordination needs related to Level of care concerns in a patient with HTN, HLD, DMII, and Chronic pain  Chronic Disease Management support and education needs related to HTN, HLD, DMII, and Chronic pain   RNCM Clinical Goal(s):  Patient will verbalize understanding of plan for management of HTN, HLD, DMII, and Chronic pain  work with RN Case Manager to address needs related to HTN, HLD, DMII, and chronic pain  and Level of care concerns take all medications exactly as prescribed and will call provider for medication related questions attend all scheduled medical appointments: 05-31-2021 at 345 pm meet with RN Care Manager to address chronic conditions, give educational material and ongoing support for effective management of multiple chronic condtions.  demonstrate a decrease in HTN, HLD, DMII, and Chronic pain  exacerbations as evidence of chronic conditions under control and improved pain levels  demonstrate improved adherence to prescribed treatment plan for HTN, HLD, DMII, and Chronic pain as evidenced by daily monitoring and recording of CBG  adherence to ADA/ carb modified diet adherence to prescribed medication regimen contacting provider for new or worsened symptoms or questions related to changes in condition or unresolved pain and discomfort  demonstrate improved health management independence of chronic conditions by working with the pcp and CCM team to optimize health and well being verbalize basic understanding of HTN, HLD, DMII, and chronic pain disease process and self health management plan to effectively manage chronic conditions  independently demonstrate understanding of rationale for each prescribed medication for chronic conditions of HTN, HLD, DM, and chronic pain  through collaboration with RN Care manager, provider, and care team.   Interventions: 1:1 collaboration with primary care provider regarding development and update of comprehensive plan of care as evidenced by provider attestation and co-signature Inter-disciplinary care team collaboration (see longitudinal plan of care) Evaluation of current treatment plan related to  self management and patient's adherence to plan as established by provider;   Diabetes:  (Status: Goal on track: YES.) Lab Results  Component Value Date   HGBA1C 8.5 (H) 03/17/2021  Assessed patient's understanding of A1c goal: <7% Provided education to patient about basic DM disease process; Reviewed medications with patient and discussed importance of medication adherence. The patient states compliance with medications. 04-12-2021: Review of medications and the patient is compliant with medications;        Reviewed prescribed diet with patient Heart healthy/ADA diet, states his wife is helping him with dietary restrictions and is staying on him when he does not eat healthy; Counseled on importance of regular laboratory monitoring as prescribed;        Discussed plans with patient for ongoing care management follow up and provided patient with direct contact information for care  management team;      Provided patient with written educational materials related to hypo and hyperglycemia and importance of correct treatment. 12-03-2020: The patient denies any lows. Did have some readings in 200's after injection in his hip a couple weeks ago. Education and support given. 04-12-2021: the patient denies any highs and lows. States he has been having normal blood sugar readings.        Reviewed scheduled/upcoming provider appointments including: 05-31-2021  Advised patient, providing education and  rationale, to check cbg BID and record . The patient states that his blood sugars are usually 130-170. He had one recently an hour after eating that was 297. The patient feels he is doing better with his diabetes management. Review of fasting bloods sugars of <130 and post prandial of <180. 12-03-2020: The patient states his blood sugar this am was 115 and it is much better and in range now. States it was in the 200's when he got the injection in his hip. 04-12-2021: The patient states his readings are WNL now, but did not provide any readings. The patient states he is doing very well and will be better hopefully after Friday and he has surgery for BPH.       call provider for findings outside established parameters;       Review of patient status, including review of consultants reports, relevant laboratory and other test results, and medications completed;       Screening for signs and symptoms of depression related to chronic disease state;        Assessed social determinant of health barriers;         Hyperlipidemia:  (Status: Goal on track: YES.) Lab Results  Component Value Date   CHOL 141 01/31/2021   HDL 48 01/31/2021   LDLCALC 70 01/31/2021   TRIG 132 01/31/2021   CHOLHDL 2.9 01/31/2021     Medication review performed; medication list updated in electronic medical record. 04-12-2021: The patient is taking lipitor 20 mg QD Provider established cholesterol goals reviewed. 04-12-2021: The patient is at goal.  Counseled on importance of regular laboratory monitoring as prescribed; Provided HLD educational materials; Reviewed role and benefits of statin for ASCVD risk reduction; Discussed strategies to manage statin-induced myalgias; Reviewed importance of limiting foods high in cholesterol. 12-03-2020: States that his wife is monitoring his dietary intake. The patient states that she is the "boss' and she helps him with his health and well being. 04-12-2021: States compliance with heart  healthy/ADA diet    Hypertension: (Status: Goal on track: YES.) Last practice recorded BP readings:  BP Readings from Last 3 Encounters:  03/31/21 120/72  03/23/21 118/71  03/19/21 121/68  Most recent eGFR/CrCl:  Lab Results  Component Value Date   EGFR 64 01/31/2021    No components found for: CRCL  Evaluation of current treatment plan related to hypertension self management and patient's adherence to plan as established by provider. 04-12-2021: States that his blood pressures are good. Denies any episodes of elevated blood pressures or heart health   Provided education to patient re: stroke prevention, s/s of heart attack and stroke; Reviewed prescribed diet of heart healthy/ADA diet. 04-12-2021 Compliant with heart healthy/ADA diet Reviewed medications with patient and discussed importance of compliance. 04-12-2021 Compliant with medications, denies any new concerns;  Discussed plans with patient for ongoing care management follow up and provided patient with direct contact information for care management team; Advised patient, providing education and rationale, to monitor blood pressure daily and  record, calling PCP for findings outside established parameters;  Reviewed scheduled/upcoming provider appointments including. Patient has surgery on 04-15-2021 for BPH Provided education on prescribed diet review of heart healthy/ADA diet, the patient is compliant with dietary restrictions at this time;  Discussed complications of poorly controlled blood pressure such as heart disease, stroke, circulatory complications, vision complications, kidney impairment, sexual dysfunction;   Pain:  (Status: Goal on Track (progressing): YES. Goal Not Met.) Pain assessment performed. 12-03-2020: The patient states that since getting the injection in his hip a couple of weeks ago he is not experiencing any pain in his hip and he can actually lay down and sleep. The patient is still experiencing a lot of pain in  his right knee. He is scheduled for surgery on 12-23-2020 for repair. He is hopeful that this is going to help his pain considerably. The patient is using a cane when ambulating. Denies falls. Review of safety. 04-12-2021: The patient states he is not having pain and his pain has resolved. Will continue to monitor for changes or new concerns Medications reviewed. 12-03-2020: The shot to his right hip has been effective in eliminating the pain in his hip Reviewed provider established plan for pain management. 12-03-2020: Scheduled for surgery on 12-23-2020 for surgical repair of right knee.  Discussed importance of adherence to all scheduled medical appointments, the patient states he is taking ibuprofen for pain relief. The patient states the lidocaine patches have not been effective for pain control. The patient states he can hardly walk. 12-03-2020: Has a follow up appointment on 12-23-2020 with the pcp but will need to change. Surgery scheduled for right knee surgery on 12-23-2020. The patient is hopeful that this will help his pain in his right knee.  Counseled on the importance of reporting any/all new or changed pain symptoms or management strategies to pain management provider; Advised patient to report to care team affect of pain on daily activities; Discussed use of relaxation techniques and/or diversional activities to assist with pain reduction (distraction, imagery, relaxation, massage, acupressure, TENS, heat, and cold application; Reviewed with patient prescribed pharmacological and nonpharmacological pain relief strategies; Advised patient to discuss other pain treatment options with provider. The patient is going to ask the pcp about cortisone shots at his appointment on 10-06-2020. He states he can hardly walk. Review of safety and fall prevention;   BPH  (Status: Goal on Track (progressing): YES.) Long Term Goal  Evaluation of current treatment plan related to BPH,  self-management and  patient's adherence to plan as established by provider. Discussed plans with patient for ongoing care management follow up and provided patient with direct contact information for care management team Advised patient to call the office for changes in his conditions. The patient is going to have surgical intervention for BPH on 04-15-2021. Saw cardiology yesterday for clearance for surgery on Friday. Denies any additional issues with edema today as presenting to the office on 03-31-2021; Provided education to patient re: following the plan of care, post op process, urinary retention, reporting any sx and sx of infection and other questions and concerns about urinary health; Reviewed medications with patient and discussed compliance. The patient is compliant with medications. ; Discussed plans with patient for ongoing care management follow up and provided patient with direct contact information for care management team; Advised patient to discuss questions and concerns related to BPH and Urinary concerns  with provider;   Patient Goals/Self-Care Activities: Patient will self administer medications as prescribed Patient will attend  all scheduled provider appointments Patient will call pharmacy for medication refills Patient will attend church or other social activities Patient will continue to perform ADL's independently Patient will continue to perform IADL's independently Patient will call provider office for new concerns or questions Patient will work with BSW to address care coordination needs and will continue to work with the clinical team to address health care and disease management related needs.         Plan:Telephone follow up appointment with care management team member scheduled for:  05-31-2021 at 28 pm  Noreene Larsson RN, MSN, [facility] Mobile: [phone]

## 2021-04-12 NOTE — Patient Instructions (Signed)
Visit Information  Thank you for taking time to visit with me today. Please don't hesitate to contact me if I can be of assistance to you before our next scheduled telephone appointment.  Following are the goals we discussed today:  RNCM Clinical Goal(s):  Patient will verbalize understanding of plan for management of HTN, HLD, DMII, and Chronic pain  work with RN Case Manager to address needs related to HTN, HLD, DMII, and chronic pain  and Level of care concerns take all medications exactly as prescribed and will call provider for medication related questions attend all scheduled medical appointments: 05-31-2021 at 345 pm meet with RN Care Manager to address chronic conditions, give educational material and ongoing support for effective management of multiple chronic condtions.  demonstrate a decrease in HTN, HLD, DMII, and Chronic pain  exacerbations as evidence of chronic conditions under control and improved pain levels  demonstrate improved adherence to prescribed treatment plan for HTN, HLD, DMII, and Chronic pain as evidenced by daily monitoring and recording of CBG  adherence to ADA/ carb modified diet adherence to prescribed medication regimen contacting provider for new or worsened symptoms or questions related to changes in condition or unresolved pain and discomfort  demonstrate improved health management independence of chronic conditions by working with the pcp and CCM team to optimize health and well being verbalize basic understanding of HTN, HLD, DMII, and chronic pain disease process and self health management plan to effectively manage chronic conditions independently demonstrate understanding of rationale for each prescribed medication for chronic conditions of HTN, HLD, DM, and chronic pain  through collaboration with RN Care manager, provider, and care team.    Interventions: 1:1 collaboration with primary care provider regarding development and update of comprehensive plan of  care as evidenced by provider attestation and co-signature Inter-disciplinary care team collaboration (see longitudinal plan of care) Evaluation of current treatment plan related to  self management and patient's adherence to plan as established by provider;     Diabetes:  (Status: Goal on track: YES.)      Lab Results  Component Value Date    HGBA1C 8.5 (H) 03/17/2021  Assessed patient's understanding of A1c goal: <7% Provided education to patient about basic DM disease process; Reviewed medications with patient and discussed importance of medication adherence. The patient states compliance with medications. 04-12-2021: Review of medications and the patient is compliant with medications;        Reviewed prescribed diet with patient Heart healthy/ADA diet, states his wife is helping him with dietary restrictions and is staying on him when he does not eat healthy; Counseled on importance of regular laboratory monitoring as prescribed;        Discussed plans with patient for ongoing care management follow up and provided patient with direct contact information for care management team;      Provided patient with written educational materials related to hypo and hyperglycemia and importance of correct treatment. 12-03-2020: The patient denies any lows. Did have some readings in 200's after injection in his hip a couple weeks ago. Education and support given. 04-12-2021: the patient denies any highs and lows. States he has been having normal blood sugar readings.        Reviewed scheduled/upcoming provider appointments including: 05-31-2021  Advised patient, providing education and rationale, to check cbg BID and record . The patient states that his blood sugars are usually 130-170. He had one recently an hour after eating that was 297. The patient feels he is doing  better with his diabetes management. Review of fasting bloods sugars of <130 and post prandial of <180. 12-03-2020: The patient states his blood  sugar this am was 115 and it is much better and in range now. States it was in the 200's when he got the injection in his hip. 04-12-2021: The patient states his readings are WNL now, but did not provide any readings. The patient states he is doing very well and will be better hopefully after Friday and he has surgery for BPH.       call provider for findings outside established parameters;       Review of patient status, including review of consultants reports, relevant laboratory and other test results, and medications completed;       Screening for signs and symptoms of depression related to chronic disease state;        Assessed social determinant of health barriers;          Hyperlipidemia:  (Status: Goal on track: YES.)      Lab Results  Component Value Date    CHOL 141 01/31/2021    HDL 48 01/31/2021    LDLCALC 70 01/31/2021    TRIG 132 01/31/2021    CHOLHDL 2.9 01/31/2021      Medication review performed; medication list updated in electronic medical record. 04-12-2021: The patient is taking lipitor 20 mg QD Provider established cholesterol goals reviewed. 04-12-2021: The patient is at goal.  Counseled on importance of regular laboratory monitoring as prescribed; Provided HLD educational materials; Reviewed role and benefits of statin for ASCVD risk reduction; Discussed strategies to manage statin-induced myalgias; Reviewed importance of limiting foods high in cholesterol. 12-03-2020: States that his wife is monitoring his dietary intake. The patient states that she is the "boss' and she helps him with his health and well being. 04-12-2021: States compliance with heart healthy/ADA diet     Hypertension: (Status: Goal on track: YES.) Last practice recorded BP readings:     BP Readings from Last 3 Encounters:  03/31/21 120/72  03/23/21 118/71  03/19/21 121/68  Most recent eGFR/CrCl:       Lab Results  Component Value Date    EGFR 64 01/31/2021    No components found for: CRCL    Evaluation of current treatment plan related to hypertension self management and patient's adherence to plan as established by provider. 04-12-2021: States that his blood pressures are good. Denies any episodes of elevated blood pressures or heart health   Provided education to patient re: stroke prevention, s/s of heart attack and stroke; Reviewed prescribed diet of heart healthy/ADA diet. 04-12-2021 Compliant with heart healthy/ADA diet Reviewed medications with patient and discussed importance of compliance. 04-12-2021 Compliant with medications, denies any new concerns;  Discussed plans with patient for ongoing care management follow up and provided patient with direct contact information for care management team; Advised patient, providing education and rationale, to monitor blood pressure daily and record, calling PCP for findings outside established parameters;  Reviewed scheduled/upcoming provider appointments including. Patient has surgery on 04-15-2021 for BPH Provided education on prescribed diet review of heart healthy/ADA diet, the patient is compliant with dietary restrictions at this time;  Discussed complications of poorly controlled blood pressure such as heart disease, stroke, circulatory complications, vision complications, kidney impairment, sexual dysfunction;    Pain:  (Status: Goal on Track (progressing): YES. Goal Not Met.) Pain assessment performed. 12-03-2020: The patient states that since getting the injection in his hip a couple of weeks ago  he is not experiencing any pain in his hip and he can actually lay down and sleep. The patient is still experiencing a lot of pain in his right knee. He is scheduled for surgery on 12-23-2020 for repair. He is hopeful that this is going to help his pain considerably. The patient is using a cane when ambulating. Denies falls. Review of safety. 04-12-2021: The patient states he is not having pain and his pain has resolved. Will continue to  monitor for changes or new concerns Medications reviewed. 12-03-2020: The shot to his right hip has been effective in eliminating the pain in his hip Reviewed provider established plan for pain management. 12-03-2020: Scheduled for surgery on 12-23-2020 for surgical repair of right knee.  Discussed importance of adherence to all scheduled medical appointments, the patient states he is taking ibuprofen for pain relief. The patient states the lidocaine patches have not been effective for pain control. The patient states he can hardly walk. 12-03-2020: Has a follow up appointment on 12-23-2020 with the pcp but will need to change. Surgery scheduled for right knee surgery on 12-23-2020. The patient is hopeful that this will help his pain in his right knee.  Counseled on the importance of reporting any/all new or changed pain symptoms or management strategies to pain management provider; Advised patient to report to care team affect of pain on daily activities; Discussed use of relaxation techniques and/or diversional activities to assist with pain reduction (distraction, imagery, relaxation, massage, acupressure, TENS, heat, and cold application; Reviewed with patient prescribed pharmacological and nonpharmacological pain relief strategies; Advised patient to discuss other pain treatment options with provider. The patient is going to ask the pcp about cortisone shots at his appointment on 10-06-2020. He states he can hardly walk. Review of safety and fall prevention;     BPH  (Status: Goal on Track (progressing): YES.) Long Term Goal  Evaluation of current treatment plan related to BPH,  self-management and patient's adherence to plan as established by provider. Discussed plans with patient for ongoing care management follow up and provided patient with direct contact information for care management team Advised patient to call the office for changes in his conditions. The patient is going to have surgical  intervention for BPH on 04-15-2021. Saw cardiology yesterday for clearance for surgery on Friday. Denies any additional issues with edema today as presenting to the office on 03-31-2021; Provided education to patient re: following the plan of care, post op process, urinary retention, reporting any sx and sx of infection and other questions and concerns about urinary health; Reviewed medications with patient and discussed compliance. The patient is compliant with medications. ; Discussed plans with patient for ongoing care management follow up and provided patient with direct contact information for care management team; Advised patient to discuss questions and concerns related to BPH and Urinary concerns  with provider;    Patient Goals/Self-Care Activities: Patient will self administer medications as prescribed Patient will attend all scheduled provider appointments Patient will call pharmacy for medication refills Patient will attend church or other social activities Patient will continue to perform ADL's independently Patient will continue to perform IADL's independently Patient will call provider office for new concerns or questions Patient will work with BSW to address care coordination needs and will continue to work with the clinical team to address health care and disease management related needs.            Our next appointment is by telephone on 05-31-2021 at 345  pm  Please call the care guide team at [phone] if you need to cancel or reschedule your appointment.   If you are experiencing a Mental Health or [place] or need someone to talk to, please call the Suicide and Crisis Lifeline: 988 call the [place] National Suicide Prevention Lifeline: (786) 451-7739 or TTY: 7037736284 TTY 918-552-3473) to talk to a trained counselor call 1-800-273-TALK (toll free, 24 hour hotline)   The patient verbalized understanding of instructions, educational materials, and care  plan provided today and declined offer to receive copy of patient instructions, educational materials, and care plan.   Noreene Larsson RN, MSN, [facility] Mobile: [phone]

## 2021-04-13 ENCOUNTER — Telehealth: Payer: Self-pay | Admitting: Urgent Care

## 2021-04-13 NOTE — Progress Notes (Signed)
°  Perioperative Services Pre-Admission/Anesthesia Testing     Date: 04/13/21  Name: Matthew [name] MRN:   [id number]  Re: Surgical clearance  Surgical clearance received from Dr. Derrick Ravel office (cardiology). Patient has been cleared for the planned [place] MORCELLATION; CYSTOSCOPY WITH RETROGRADE PYELOGRAM procedure scheduled for 04/15/2021 with Dr. Nickolas Madrid.  Cardiology notes that patient may proceed with an overall LOW risk stratification. He has been cleared to hold his low dose ASA for 4 days if required by urology. Copy of signed clearance form placed on patient's OR chart for review by the surgical/anesthetic team on the day of his procedure.   Honor Loh, MSN, APRN, FNP-C, CEN [facility]  Peri-operative Services Nurse Practitioner Phone: [phone] 04/13/21 2:43 PM

## 2021-04-15 ENCOUNTER — Ambulatory Visit: Payer: Medicare HMO

## 2021-04-15 ENCOUNTER — Ambulatory Visit: Payer: Medicare HMO | Admitting: Urgent Care

## 2021-04-15 ENCOUNTER — Ambulatory Visit
Admission: RE | Admit: 2021-04-15 | Discharge: 2021-04-15 | Disposition: A | Discharge status: Home / Self Care | Payer: Medicare HMO | Attending: Urology | Admitting: Urology

## 2021-04-15 ENCOUNTER — Other Ambulatory Visit: Payer: Self-pay

## 2021-04-15 ENCOUNTER — Encounter: Payer: Self-pay | Admitting: Urology

## 2021-04-15 ENCOUNTER — Encounter
Admission: RE | Disposition: A | Discharge status: Home / Self Care | Payer: Self-pay | Source: Home / Self Care | Attending: Urology

## 2021-04-15 DIAGNOSIS — N1330 Unspecified hydronephrosis: Secondary | ICD-10-CM

## 2021-04-15 DIAGNOSIS — E119 Type 2 diabetes mellitus without complications: Secondary | ICD-10-CM | POA: Diagnosis not present

## 2021-04-15 DIAGNOSIS — Z794 Long term (current) use of insulin: Secondary | ICD-10-CM | POA: Insufficient documentation

## 2021-04-15 DIAGNOSIS — I471 Supraventricular tachycardia: Secondary | ICD-10-CM | POA: Diagnosis not present

## 2021-04-15 DIAGNOSIS — N138 Other obstructive and reflux uropathy: Secondary | ICD-10-CM

## 2021-04-15 DIAGNOSIS — N401 Enlarged prostate with lower urinary tract symptoms: Secondary | ICD-10-CM | POA: Diagnosis not present

## 2021-04-15 DIAGNOSIS — N134 Hydroureter: Secondary | ICD-10-CM

## 2021-04-15 DIAGNOSIS — N12 Tubulo-interstitial nephritis, not specified as acute or chronic: Secondary | ICD-10-CM

## 2021-04-15 HISTORY — PX: 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: SHX6641

## 2021-04-15 HISTORY — PX: CYSTOSCOPY W/ RETROGRADES: SHX1426

## 2021-04-15 LAB — GLUCOSE, CAPILLARY
Glucose-Capillary: 187 mg/dL — ABNORMAL HIGH (ref 70–99)
Glucose-Capillary: 203 mg/dL — ABNORMAL HIGH (ref 70–99)

## 2021-04-15 SURGERY — ENUCLEATION, PROSTATE, USING LASER, WITH MORCELLATION
Anesthesia: General

## 2021-04-15 MED ORDER — ONDANSETRON HCL 4 MG/2ML IJ SOLN
INTRAMUSCULAR | Status: AC
  Filled 2021-04-15: qty 2

## 2021-04-15 MED ORDER — PHENYLEPHRINE 40 MCG/ML (10ML) SYRINGE FOR IV PUSH (FOR BLOOD PRESSURE SUPPORT)
PREFILLED_SYRINGE | INTRAVENOUS | Status: DC | PRN
Start: 2021-04-15 — End: 2021-04-15
  Administered 2021-04-15: 120 ug via INTRAVENOUS
  Administered 2021-04-15 (×2): 80 ug via INTRAVENOUS
  Administered 2021-04-15: 160 ug via INTRAVENOUS

## 2021-04-15 MED ORDER — CHLORHEXIDINE GLUCONATE 0.12 % MT SOLN: 15.0000 mL | Freq: Once | OROMUCOSAL | Status: AC

## 2021-04-15 MED ORDER — SUGAMMADEX SODIUM 200 MG/2ML IV SOLN
INTRAVENOUS | Status: DC | PRN
Start: 2021-04-15 — End: 2021-04-15
  Administered 2021-04-15: 200 mg via INTRAVENOUS

## 2021-04-15 MED ORDER — SODIUM CHLORIDE 0.9 % IR SOLN
Status: DC | PRN
  Administered 2021-04-15: 30000 mL via INTRAVESICAL

## 2021-04-15 MED ORDER — LACTATED RINGERS IV SOLN: INTRAVENOUS | Status: DC

## 2021-04-15 MED ORDER — SODIUM CHLORIDE 0.9 % IV SOLN: INTRAVENOUS | Status: DC

## 2021-04-15 MED ORDER — FAMOTIDINE 20 MG PO TABS
ORAL_TABLET | ORAL | Status: AC
  Administered 2021-04-15: 20 mg via ORAL
  Filled 2021-04-15: qty 1

## 2021-04-15 MED ORDER — FENTANYL CITRATE (PF) 100 MCG/2ML IJ SOLN
INTRAMUSCULAR | Status: DC | PRN
  Administered 2021-04-15: 50 ug via INTRAVENOUS

## 2021-04-15 MED ORDER — OXYCODONE HCL 5 MG/5ML PO SOLN: 5.0000 mg | Freq: Once | ORAL | Status: DC | PRN

## 2021-04-15 MED ORDER — CIPROFLOXACIN IN D5W 400 MG/200ML IV SOLN
INTRAVENOUS | Status: AC
  Filled 2021-04-15: qty 200

## 2021-04-15 MED ORDER — ROCURONIUM BROMIDE 100 MG/10ML IV SOLN
INTRAVENOUS | Status: DC | PRN
Start: 2021-04-15 — End: 2021-04-15
  Administered 2021-04-15: 20 mg via INTRAVENOUS
  Administered 2021-04-15: 50 mg via INTRAVENOUS

## 2021-04-15 MED ORDER — HYDROCODONE-ACETAMINOPHEN 5-325 MG PO TABS: 1.0000 | ORAL_TABLET | Freq: Four times a day (QID) | ORAL | 0 refills | Status: AC | PRN

## 2021-04-15 MED ORDER — ROCURONIUM BROMIDE 10 MG/ML (PF) SYRINGE
PREFILLED_SYRINGE | INTRAVENOUS | Status: AC
  Filled 2021-04-15: qty 10

## 2021-04-15 MED ORDER — PROPOFOL 10 MG/ML IV BOLUS
INTRAVENOUS | Status: AC
  Filled 2021-04-15: qty 20

## 2021-04-15 MED ORDER — ORAL CARE MOUTH RINSE
15.0000 mL | Freq: Once | OROMUCOSAL | Status: AC
Start: 2021-04-15 — End: 2021-04-15

## 2021-04-15 MED ORDER — CIPROFLOXACIN IN D5W 400 MG/200ML IV SOLN
400.0000 mg | INTRAVENOUS | Status: AC
  Administered 2021-04-15: 400 mg via INTRAVENOUS

## 2021-04-15 MED ORDER — IOHEXOL 180 MG/ML  SOLN
INTRAMUSCULAR | Status: DC | PRN
  Administered 2021-04-15: 10 mL via ORAL

## 2021-04-15 MED ORDER — ONDANSETRON HCL 4 MG/2ML IJ SOLN
INTRAMUSCULAR | Status: DC | PRN
  Administered 2021-04-15: 4 mg via INTRAVENOUS

## 2021-04-15 MED ORDER — FAMOTIDINE 20 MG PO TABS: 20.0000 mg | ORAL_TABLET | Freq: Once | ORAL | Status: AC

## 2021-04-15 MED ORDER — ACETAMINOPHEN 10 MG/ML IV SOLN: 1000.0000 mg | Freq: Once | INTRAVENOUS | Status: DC | PRN

## 2021-04-15 MED ORDER — FENTANYL CITRATE (PF) 100 MCG/2ML IJ SOLN
INTRAMUSCULAR | Status: AC
  Filled 2021-04-15: qty 2

## 2021-04-15 MED ORDER — LIDOCAINE HCL (PF) 2 % IJ SOLN
INTRAMUSCULAR | Status: AC
  Filled 2021-04-15: qty 5

## 2021-04-15 MED ORDER — DEXMEDETOMIDINE (PRECEDEX) IN NS 20 MCG/5ML (4 MCG/ML) IV SYRINGE
PREFILLED_SYRINGE | INTRAVENOUS | Status: DC | PRN
  Administered 2021-04-15 (×2): 10 ug via INTRAVENOUS

## 2021-04-15 MED ORDER — CHLORHEXIDINE GLUCONATE 0.12 % MT SOLN
OROMUCOSAL | Status: AC
  Administered 2021-04-15: 15 mL via OROMUCOSAL
  Filled 2021-04-15: qty 15

## 2021-04-15 MED ORDER — ACETAMINOPHEN 10 MG/ML IV SOLN
INTRAVENOUS | Status: AC
  Filled 2021-04-15: qty 100

## 2021-04-15 MED ORDER — PROPOFOL 10 MG/ML IV BOLUS
INTRAVENOUS | Status: DC | PRN
Start: 2021-04-15 — End: 2021-04-15
  Administered 2021-04-15: 200 mg via INTRAVENOUS

## 2021-04-15 MED ORDER — LIDOCAINE HCL (CARDIAC) PF 100 MG/5ML IV SOSY
PREFILLED_SYRINGE | INTRAVENOUS | Status: DC | PRN
Start: 2021-04-15 — End: 2021-04-15
  Administered 2021-04-15: 100 mg via INTRAVENOUS

## 2021-04-15 MED ORDER — DEXAMETHASONE SODIUM PHOSPHATE 10 MG/ML IJ SOLN
INTRAMUSCULAR | Status: AC
  Filled 2021-04-15: qty 1

## 2021-04-15 MED ORDER — DEXAMETHASONE SODIUM PHOSPHATE 10 MG/ML IJ SOLN
INTRAMUSCULAR | Status: DC | PRN
Start: 2021-04-15 — End: 2021-04-15
  Administered 2021-04-15: 5 mg via INTRAVENOUS

## 2021-04-15 MED ORDER — ACETAMINOPHEN 10 MG/ML IV SOLN
INTRAVENOUS | Status: DC | PRN
  Administered 2021-04-15: 1000 mg via INTRAVENOUS

## 2021-04-15 MED ORDER — ONDANSETRON HCL 4 MG/2ML IJ SOLN: 4.0000 mg | Freq: Once | INTRAMUSCULAR | Status: DC | PRN

## 2021-04-15 MED ORDER — FENTANYL CITRATE (PF) 100 MCG/2ML IJ SOLN: 25.0000 ug | INTRAMUSCULAR | Status: DC | PRN

## 2021-04-15 MED ORDER — CEFAZOLIN SODIUM-DEXTROSE 2-4 GM/100ML-% IV SOLN
INTRAVENOUS | Status: AC
  Filled 2021-04-15: qty 100

## 2021-04-15 MED ORDER — OXYCODONE HCL 5 MG PO TABS: 5.0000 mg | ORAL_TABLET | Freq: Once | ORAL | Status: DC | PRN

## 2021-04-15 SURGICAL SUPPLY — 40 items
ADAPTER IRRIG TUBE 2 SPIKE SOL (ADAPTER) ×6 IMPLANT
BAG DRAIN CYSTO-URO LG1000N (MISCELLANEOUS) ×3 IMPLANT
BAG URO DRAIN 4000ML (MISCELLANEOUS) ×3 IMPLANT
BRUSH SCRUB EZ 1% IODOPHOR (MISCELLANEOUS) ×3 IMPLANT
CATH FOLEY 3WAY 30CC 24FR (CATHETERS) ×1
CATH URETL OPEN 5X70 (CATHETERS) ×3 IMPLANT
CATH URETL OPEN END 4X70 (CATHETERS) ×3 IMPLANT
CATH URTH STD 24FR FL 3W 2 (CATHETERS) ×2 IMPLANT
CONTAINER COLLECT MORCELLATR (MISCELLANEOUS) ×2 IMPLANT
DRAPE UTILITY 15X26 TOWEL STRL (DRAPES) ×3 IMPLANT
ELECT BIVAP BIPO 22/24 DONUT (ELECTROSURGICAL)
ELECTRD BIVAP BIPO 22/24 DONUT (ELECTROSURGICAL) IMPLANT
FIBER LASER MOSES 550 DFL (Laser) IMPLANT
FILTER OVERFLOW MORCELLATOR (FILTER) ×2 IMPLANT
GAUZE 4X4 16PLY ~~LOC~~+RFID DBL (SPONGE) ×6 IMPLANT
GLOVE SURG UNDER POLY LF SZ7.5 (GLOVE) ×3 IMPLANT
GOWN STRL REUS W/ TWL LRG LVL3 (GOWN DISPOSABLE) ×2 IMPLANT
GOWN STRL REUS W/ TWL XL LVL3 (GOWN DISPOSABLE) ×2 IMPLANT
GOWN STRL REUS W/TWL LRG LVL3 (GOWN DISPOSABLE) ×1
GOWN STRL REUS W/TWL XL LVL3 (GOWN DISPOSABLE) ×1
GUIDEWIRE STR DUAL SENSOR (WIRE) ×3 IMPLANT
HOLDER FOLEY CATH W/STRAP (MISCELLANEOUS) ×3 IMPLANT
IV NS IRRIG 3000ML ARTHROMATIC (IV SOLUTION) ×20 IMPLANT
KIT TURNOVER CYSTO (KITS) ×3 IMPLANT
MANIFOLD NEPTUNE II (INSTRUMENTS) ×3 IMPLANT
MBRN O SEALING YLW 17 FOR INST (MISCELLANEOUS) ×3
MEMBRANE SLNG YLW 17 FOR INST (MISCELLANEOUS) ×2 IMPLANT
MORCELLATOR COLLECT CONTAINER (MISCELLANEOUS) ×3
MORCELLATOR OVERFLOW FILTER (FILTER) ×3
MORCELLATOR ROTATION 4.75 335 (MISCELLANEOUS) ×3 IMPLANT
PACK CYSTO AR (MISCELLANEOUS) ×3 IMPLANT
SET CYSTO W/LG BORE CLAMP LF (SET/KITS/TRAYS/PACK) ×3 IMPLANT
SET IRRIG Y TYPE TUR BLADDER L (SET/KITS/TRAYS/PACK) ×3 IMPLANT
SLEEVE PROTECTION STRL DISP (MISCELLANEOUS) ×6 IMPLANT
SURGILUBE 2OZ TUBE FLIPTOP (MISCELLANEOUS) ×3 IMPLANT
SYR TOOMEY IRRIG 70ML (MISCELLANEOUS) ×3
SYRINGE TOOMEY IRRIG 70ML (MISCELLANEOUS) ×2 IMPLANT
TUBE PUMP MORCELLATOR PIRANHA (TUBING) ×3 IMPLANT
WATER STERILE IRR 1000ML POUR (IV SOLUTION) ×3 IMPLANT
WATER STERILE IRR 500ML POUR (IV SOLUTION) ×3 IMPLANT

## 2021-04-15 NOTE — Transfer of Care (Signed)
Immediate Anesthesia Transfer of Care Note  Patient: Matthew [name]  Procedure(s) Performed: 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 CYSTOSCOPY WITH RETROGRADE PYELOGRAM (Left)  Patient Location: PACU  Anesthesia Type:General  Level of Consciousness: drowsy  Airway & Oxygen Therapy: Patient Spontanous Breathing and Patient connected to face mask oxygen  Post-op Assessment: Report given to RN and Post -op Vital signs reviewed and stable  Post vital signs: Reviewed and stable  Last Vitals:  Vitals Value Taken Time  BP 118/80 04/15/21 0950  Temp 36.6 C 04/15/21 0950  Pulse 66 04/15/21 0954  Resp 0 04/15/21 0954  SpO2 99 % 04/15/21 0954    Last Pain:  Vitals:   04/15/21 0950  TempSrc:   PainSc: Asleep         Complications: No notable events documented.

## 2021-04-15 NOTE — Discharge Instructions (Addendum)

## 2021-04-15 NOTE — Anesthesia Postprocedure Evaluation (Signed)
Anesthesia Post Note  Patient: Matthew [name]  Procedure(s) Performed: 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 CYSTOSCOPY WITH RETROGRADE PYELOGRAM (Left)  Patient location during evaluation: PACU Anesthesia Type: General Level of consciousness: awake and alert, oriented and patient cooperative Pain management: pain level controlled Vital Signs Assessment: post-procedure vital signs reviewed and stable Respiratory status: spontaneous breathing, nonlabored ventilation and respiratory function stable Cardiovascular status: blood pressure returned to baseline and stable Postop Assessment: adequate PO intake Anesthetic complications: no   No notable events documented.   Last Vitals:  Vitals:   04/15/21 1040 04/15/21 1050  BP: 120/69 135/81  Pulse: 72 70  Resp: 16 16  Temp: (!) 36.1 C (!) 35.6 C  SpO2: 95% 99%    Last Pain:  Vitals:   04/15/21 1050  TempSrc: Temporal  PainSc: 0-No pain                 Darrin Nipper

## 2021-04-15 NOTE — Anesthesia Procedure Notes (Signed)
Procedure Name: Intubation Date/Time: 04/15/2021 8:22 AM Performed by: Loletha Grayer, CRNA Pre-anesthesia Checklist: Patient identified, Patient being monitored, Timeout performed, Emergency Drugs available and Suction available Patient Re-evaluated:Patient Re-evaluated prior to induction Oxygen Delivery Method: Circle system utilized Preoxygenation: Pre-oxygenation with 100% oxygen Induction Type: IV induction Ventilation: Two handed mask ventilation required and Oral airway inserted - appropriate to patient size Laryngoscope Size: McGraph and 4 Grade View: Grade I Tube type: Oral Tube size: 7.5 mm Number of attempts: 1 Airway Equipment and Method: Stylet Placement Confirmation: ETT inserted through vocal cords under direct vision, positive ETCO2 and breath sounds checked- equal and bilateral Secured at: 22 cm Tube secured with: Tape Dental Injury: Teeth and Oropharynx as per pre-operative assessment

## 2021-04-15 NOTE — H&P (Signed)
04/15/21 8:01 AM   Matthew [name] [birth year] [id number]  CC: BPH and urinary retention, left hydronephrosis  HPI: 76 year old male who presented to the ER in January 2023 with 2 to 3 days of abdominal and back pain, long history of weak urinary stream and difficulty urinating, and evidence of left-sided pyelonephritis.  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  Foley catheter was placed and he was started on antibiotics which completely resolved his left-sided flank pain.  He had significant postobstructive diuresis.  PSA in the past was normal at 1.1 in July 2022.  Renal function improved significantly after Foley catheter placement.     He is doing well with the catheter in place and denies any flank pain.   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    He has opted for outlet procedure with HOLEP     PMH: Past Medical History:  Diagnosis Date   Arthritis    bil knees and hips and back   Asthma    as a child   Hyperlipidemia    Hypertension    Sleep apnea    does not use cpap since losing weight   SVT (supraventricular tachycardia) ([place]) 03/17/2021   Type 2 diabetes mellitus ([place])     Surgical History: Past Surgical History:  Procedure Laterality Date   COLONOSCOPY     HERNIA REPAIR     inguinal   KNEE ARTHROSCOPY Right 12/23/2020   Procedure: RIGHT ARTHROSCOPY KNEE PARTIAL MEDIAL AND LATERAL MENISCECTOMY;  Surgeon: Thornton Park, MD;  Location: [facility] ORS;  Service: Orthopedics;  Laterality: Right;     Family History: Family History  Problem Relation Age of Onset   Diabetes Mother    Hypertension Father     Diabetes Sister    Diabetes Brother    Diabetes Maternal Grandmother     Social History:  reports that he has never smoked. He has never used smokeless tobacco. He reports that he does not drink alcohol and does not use drugs.  Physical Exam: BP 125/77    Pulse 73    Temp 97.8 F (36.6 C) (Temporal)    Resp 18    SpO2 98%    Constitutional:  Alert and oriented, No acute distress. 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  We discussed the risks and benefits of HoLEP at length.  The procedure requires general anesthesia and takes 1 to 2 hours, and a holmium laser is used to enucleate the prostate and push this tissue into the bladder.  A morcellator is then used to remove this tissue, which is sent for pathology.  The vast majority(>95%) of patients are able to discharge the same day with a catheter in place for 2 to 3 days, and will follow-up in clinic for a voiding trial.  We specifically discussed the risks of bleeding, infection, retrograde ejaculation, temporary urgency and  urge incontinence, very low risk of long-term incontinence, urethral stricture/bladder neck contracture, pathologic evaluation of prostate tissue and possible detection of prostate cancer or other malignancy, and possible need for additional procedures.  Cystoscopy, left retrograde pyelogram, HOLEP today   Nickolas Madrid, MD 04/15/2021  [facility] [address] [place], [place] [address] [phone]

## 2021-04-15 NOTE — Anesthesia Preprocedure Evaluation (Addendum)
Anesthesia Evaluation  Patient identified by MRN, date of birth, ID band Patient awake    Reviewed: Allergy & Precautions, NPO status , Patient's Chart, lab work & pertinent test results  History of Anesthesia Complications Negative for: history of anesthetic complications  Airway Mallampati: IV   Neck ROM: Full    Dental  (+) Upper Dentures, Lower Dentures   Pulmonary sleep apnea ,    Pulmonary exam normal breath sounds clear to auscultation       Cardiovascular hypertension, Normal cardiovascular exam+ dysrhythmias (a fib)  Rhythm:Regular Rate:Normal  ECG 03/17/21:  Sinus rhythm with Premature atrial complexes Otherwise normal ECG   Neuro/Psych negative neurological ROS     GI/Hepatic negative GI ROS,   Endo/Other  diabetes, Type 2, Insulin DependentObesity   Renal/GU Renal disease   BPH with urinary obstruction    Musculoskeletal   Abdominal   Peds  Hematology negative hematology ROS (+)   Anesthesia Other Findings Cardiology note 04/11/21:  Plan  -Proceed to surgery and/or invasive procedure without restriction to pre or post operative and/or procedural care. The patient is at lowest risk possible for cardiovascular complications with surgical intervention and/or invasive procedure. Currently has no evidence active and/or significant angina and/or congestive heart failure. The patient may discontinue aspirin 4 days prior to procedure and restart at a safe period thereafter -We have had a long discussion about the benefits of physical and occupational rehabilitation. The patient is advised and encouraged to enroll for improvements in quality of life and reduced hospitalization. -Hypertension medication management listed above has been reviewed and discussed with the patient. We will continue current medical regimen at this time for reduction of risk of cardiovascular disease. The patient will report any new or  future change of blood pressure for need in potential treatment changes. We have recommended home blood pressure monitoring if able. -We have discussed risk reduction in the cardiovascular disease process by a continuation of lipid management with the current medication management for lipid reduction. The goals continue to be 30-50% lowering of LDL cholesterol in addition to lifestyle measures. This will include diet and improved activity level on a regular basis.The patient has an understanding of this discussion at this time and we will continue the appropriate strategy. -Continue aggressive medical management of diabetes following the ABCs for prevention of cardiovascular disease and complications. The goals set forth include a goal HbA1c of less than 7, moderate to high intensity statin use if patient can tolerate, and a goal systolic blood pressure of below 130mm.  -We have discussed a diet and lifestyle program to improve quality of life measures and cardiovascular risk reduction. This includes a diet rich in fruits, vegetables, fiber, and nuts with lower amounts of saturated fats. The patient was also instructed to do regular daily activity to help slowly burn calories. We have discussed the possibility of a exercise prescription as well. The patient is advised to begin this activity and watch for improvements over the next few months.  Orders Placed This Encounter  Procedures   ECG 12-lead   Return in about 2 months (around 06/09/2021).   Reproductive/Obstetrics                            Anesthesia Physical Anesthesia Plan  ASA: 3  Anesthesia Plan: General   Post-op Pain Management:    Induction: Intravenous  PONV Risk Score and Plan: 2 and Ondansetron, Dexamethasone and Treatment may vary due to  age or medical condition  Airway Management Planned: Oral ETT  Additional Equipment:   Intra-op Plan:   Post-operative Plan: Extubation in OR  Informed  Consent: I have reviewed the patients History and Physical, chart, labs and discussed the procedure including the risks, benefits and alternatives for the proposed anesthesia with the patient or authorized representative who has indicated his/her understanding and acceptance.     Dental advisory given  Plan Discussed with: CRNA  Anesthesia Plan Comments: (Patient consented for risks of anesthesia including but not limited to:  - adverse reactions to medications - damage to eyes, teeth, lips or other oral mucosa - nerve damage due to positioning  - sore throat or hoarseness - damage to heart, brain, nerves, lungs, other parts of body or loss of life  Informed patient about role of CRNA in peri- and intra-operative care.  Patient voiced understanding.)        Anesthesia Quick Evaluation

## 2021-04-15 NOTE — Op Note (Signed)
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)  Surgeon: Nickolas Madrid, MD  Anesthesia: General  Complications: None  Intraoperative findings:  Large prostate with obstructing lateral lobes, moderate bladder trabeculations, ureteral orifice ease orthotopic, and well away from bladder neck Left retrograde pyelogram with mild hydronephrosis down to the bladder, diagnostic left ureteroscopy normal with no evidence of stricture or mass Uncomplicated HoLEP, excellent hemostasis, verumontanum and ureteral orifices intact at conclusion of case  EBL: Minimal  Specimens: Prostate chips  Enucleation time: 41 minutes  Morcellation time: 5 minutes  Intra-op weight: 40 g  Drains: 24 French three-way, 60 cc in balloon  Indication: Matthew [name] is a 76 y.o. [name] previously admitted with urinary retention and left-sided reflux and hydronephrosis as well as pyelonephritis who opted for HOLEP and left retrograde pyelogram.  After reviewing the management options for treatment, they elected to proceed with the above surgical procedure(s). We have discussed the potential benefits and risks of the procedure, side effects of the proposed treatment, the likelihood of the [name] achieving the goals of the procedure, and any potential problems that might occur during the procedure or recuperation.  We specifically discussed the risks of bleeding, infection, hematuria and clot retention, need for additional procedures, possible overnight hospital stay, temporary urgency and incontinence, rare long-term incontinence, and retrograde ejaculation.  Informed consent has been obtained.   Description of procedure:  The [name] was taken to the operating room and general  anesthesia was induced.  The [name] was placed in the dorsal lithotomy position, prepped and draped in the usual sterile fashion, and preoperative antibiotics(Cipro) were administered.  SCDs were placed for DVT prophylaxis.  A preoperative time-out was performed.   Matthew [name] sounds were used to gently dilated the urethra up to 78F. The 3 French continuous flow resectoscope was inserted into the urethra using the visual obturator  The prostate was large with obstructing lateral lobes, and the left lateral lobe was significantly larger and irregular. The bladder was thoroughly inspected and notable for moderate trabeculations.  The ureteral orifices were located in orthotopic position, and well away from the bladder neck.  A 5 French access catheter was used to perform a left retrograde pyelogram and this showed some mild hydroureteronephrosis.  I opted to perform diagnostic ureteroscopy to rule out any stricture or lesions.  A sensor wire advanced easily into the left ureteral orifice and passed up to the kidney under fluoroscopic vision.  A semirigid short ureteroscope advanced easily alongside the wire and the distal and mid ureter were thoroughly examined with no abnormality seen.  The ureter was dilated and there was no evidence of narrowing, stricture, or mass.  The wire was removed, and there was excellent efflux from the left ureter and no stent was placed.  The laser was set to 2 J and 60 Hz and was used to make a lambda incision just proximal to the verumontanum down to the level of the capsule.  A 6 o'clock incision was then made down to the level of the capsule from the bladder neck to the verumontanum.  The lateral lobes were then incised circumferentially until they were disconnected from the surrounding tissue.  The capsule was examined and laser was used for meticulous hemostasis.  There was a grouping of vessels at 2-3 o'clock that required laser coagulation.  The  78 French resectoscope was  then switched out for the 55 French nephroscope and the lobes were morcellated and the tissue sent to pathology.  A 24 French three-way catheter was inserted easily with the aid of a catheter guide, and 60 cc were placed in the balloon.  Urine was clear.  The catheter irrigated easily with a Toomey syringe.  CBI was initiated. A belladonna suppository was placed.  The [name] tolerated the procedure well without any immediate complications and was extubated and transferred to the recovery room in stable condition.  Urine was clear on fast CBI.  Disposition: Stable to PACU  Plan: Wean CBI in PACU, anticipate discharge home today with Foley removal in clinic in 2-3 days   Nickolas Madrid, MD 04/15/2021

## 2021-04-16 ENCOUNTER — Encounter: Payer: Self-pay | Admitting: Urology

## 2021-04-18 ENCOUNTER — Ambulatory Visit: Payer: Medicare HMO

## 2021-04-18 ENCOUNTER — Other Ambulatory Visit: Payer: Self-pay

## 2021-04-18 DIAGNOSIS — N138 Other obstructive and reflux uropathy: Secondary | ICD-10-CM

## 2021-04-18 NOTE — Progress Notes (Signed)
Catheter Removal  Patient is present today for a catheter removal. 87ml of water was drained from the balloon. A 24FR foley cath was removed from the bladder no complications were noted. Patient tolerated well. Patient given patient education on normal post op HoLEP symptoms. All questions answered.   Performed by: Gordy Clement, CMA   Follow up/ Additional notes: RTC in 12 weeks for post op

## 2021-04-18 NOTE — Patient Instructions (Signed)

## 2021-04-20 LAB — SURGICAL PATHOLOGY

## 2021-04-21 ENCOUNTER — Telehealth: Payer: Self-pay | Admitting: Internal Medicine

## 2021-04-21 MED ORDER — LEVEMIR FLEXTOUCH 100 UNIT/ML ~~LOC~~ SOPN: PEN_INJECTOR | SUBCUTANEOUS | 6 refills | Status: DC

## 2021-04-21 NOTE — Telephone Encounter (Signed)
Needs labs x 2 weeks from last visit per last note needs to be seen thnx. Ok to refill 30 supply with 6 refills

## 2021-04-21 NOTE — Telephone Encounter (Signed)
Called and spoke to Patient and he wanted to come in and see you and talk to you about how he can get the correct dose of insulin. Has appt at 9:20 am

## 2021-04-21 NOTE — Telephone Encounter (Unsigned)
Patient states [facility] is charging him a 60 day supply but PCP is only sending over a 42 day supply of insulin detemir (LEVEMIR FLEXTOUCH) 100 UNIT/ML FlexPen. Patient states this is second time overpaying due to the cost and supply not matching. Patient would like PCP to send in a new prescription reflecting a 60 day supply. Patient would like a follow up call to confirm it was taken care of.   [place], [place] - [place] Phone:  [phone]  Fax:  [phone]

## 2021-04-22 ENCOUNTER — Other Ambulatory Visit: Payer: Self-pay

## 2021-04-22 ENCOUNTER — Encounter: Payer: Self-pay | Admitting: Internal Medicine

## 2021-04-22 ENCOUNTER — Ambulatory Visit (INDEPENDENT_AMBULATORY_CARE_PROVIDER_SITE_OTHER): Payer: Medicare HMO | Admitting: Internal Medicine

## 2021-04-22 VITALS — BP 116/71 | HR 77 | Temp 98.1°F | Ht 69.69 in | Wt 238.8 lb

## 2021-04-22 DIAGNOSIS — E119 Type 2 diabetes mellitus without complications: Secondary | ICD-10-CM | POA: Diagnosis not present

## 2021-04-22 DIAGNOSIS — Z794 Long term (current) use of insulin: Secondary | ICD-10-CM | POA: Diagnosis not present

## 2021-04-22 DIAGNOSIS — E1165 Type 2 diabetes mellitus with hyperglycemia: Secondary | ICD-10-CM

## 2021-04-22 LAB — MICROALBUMIN, URINE WAIVED
Creatinine, Urine Waived: 50 mg/dL (ref 10–300)
Microalb, Ur Waived: 80 mg/L — ABNORMAL HIGH (ref 0–19)
Microalb/Creat Ratio: >300 mg/g — ABNORMAL HIGH (ref <30)

## 2021-04-22 LAB — URINALYSIS, ROUTINE W REFLEX MICROSCOPIC
Bilirubin, UA: NEGATIVE
Ketones, UA: NEGATIVE
Leukocytes,UA: NEGATIVE
Nitrite, UA: NEGATIVE
Specific Gravity, UA: 1.02 (ref 1.005–1.030)
Urobilinogen, Ur: 0.2 mg/dL (ref 0.2–1.0)
pH, UA: 6 (ref 5.0–7.5)

## 2021-04-22 LAB — MICROSCOPIC EXAMINATION
Bacteria, UA: NONE SEEN
Epithelial Cells (non renal): NONE SEEN /hpf (ref 0–10)
WBC, UA: NONE SEEN /hpf (ref 0–5)

## 2021-04-22 LAB — BAYER DCA HB A1C WAIVED: HB A1C (BAYER DCA - WAIVED): 8.9 % — ABNORMAL HIGH (ref 4.8–5.6)

## 2021-04-22 MED ORDER — EMPAGLIFLOZIN 25 MG PO TABS: 25.0000 mg | ORAL_TABLET | Freq: Every day | ORAL | 4 refills | Status: DC

## 2021-04-22 MED ORDER — RYBELSUS 3 MG PO TABS: 3.0000 mg | ORAL_TABLET | Freq: Every day | ORAL | 0 refills | Status: DC

## 2021-04-22 NOTE — Progress Notes (Signed)
BP 116/71    Pulse 77    Temp 98.1 F (36.7 C) (Oral)    Ht 5' 9.69" (1.77 m)    Wt 238 lb 12.8 oz (108.3 kg)    SpO2 96%    BMI 34.57 kg/m    Subjective:    Patient ID: Matthew [name], male    DOB: [birth year], 76 y.o.   MRN: [id number]  Chief Complaint  Patient presents with   Medication Consultation    Wants to speak to you about his insulin, refusing to get A1c today, states he was taken off all his diabetes meds for surgery and has not restarted them.     HPI: Matthew [name] is a 76 y.o. 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  was hospitalised for such has had recnelt surg on his prostate and bladder per chart review.   Cut back to half of insulin now a1c at 8.9  Saw urology on Monday this week is on 35 untis of levemir now. Talked to the company - supposed to be $35 a month and they are prepacked 5 pens for 45 days. They are charging him for 2 months - prepacking does this.  Is on 25 - 35 units of levemir.  Diabetes He presents for his follow-up diabetic visit. He has type 2 diabetes mellitus. His disease course has been stable. Pertinent negatives for diabetes include no blurred vision, no chest pain, no fatigue, no foot paresthesias, no foot ulcerations, no polydipsia, no polyphagia and no polyuria. Symptoms are stable.    Chief Complaint  Patient presents with   Medication Consultation    Wants to speak to you about his insulin, refusing to get A1c today, states he was taken off all his diabetes meds for surgery and has not restarted them.     Relevant past medical, surgical, family and social history reviewed and updated as indicated. Interim medical history since our last visit  reviewed. Allergies and medications reviewed and updated.  Review of Systems  Constitutional:  Negative for fatigue.  Eyes:  Negative for blurred vision.  Cardiovascular:  Negative for chest pain.  Endocrine: Negative for polydipsia, polyphagia and polyuria.   Per HPI unless specifically indicated above     Objective:    BP 116/71    Pulse 77    Temp 98.1 F (36.7 C) (Oral)    Ht 5' 9.69" (1.77 m)    Wt 238 lb 12.8 oz (108.3 kg)    SpO2 96%    BMI 34.57 kg/m   Wt Readings from Last 3 Encounters:  04/22/21 238 lb 12.8 oz (108.3 kg)  03/31/21 238 lb (108 kg)  03/29/21 238 lb (108 kg)    Physical Exam Vitals and nursing note reviewed.  Constitutional:      General: He is not in acute distress.    Appearance: Normal appearance. He is not ill-appearing or diaphoretic.  HENT:     Head: Atraumatic.     Right Ear: Tympanic membrane and external ear normal. There is no impacted cerumen.     Left Ear: External ear normal.     Nose: No congestion or rhinorrhea.     Mouth/Throat:     Pharynx: No oropharyngeal exudate or posterior oropharyngeal erythema.  Abdominal:     Palpations: There is no mass.     Tenderness: There is no abdominal tenderness.  Musculoskeletal:     Cervical back: Normal range of motion and neck supple. No rigidity or tenderness.     Left lower leg: No edema.  Skin:    General: Skin is dry.  Neurological:     Mental Status: He is alert.    Results for orders placed or performed during the hospital encounter of 04/15/21  Glucose, capillary  Result Value Ref Range   Glucose-Capillary 187 (H) 70 - 99 mg/dL  Glucose, capillary  Result Value Ref Range   Glucose-Capillary 203 (H) 70 - 99 mg/dL  Surgical pathology  Result Value Ref Range   SURGICAL PATHOLOGY      SURGICAL PATHOLOGY CASE: [id number] PATIENT: [facility] Surgical Pathology Report     Specimen Submitted: A. Prostate chips  Clinical History: Benign prostatic hyperplasia with  urinary obstruction      DIAGNOSIS: A. PROSTATE CHIPS: HOLMIUM LASER ENUCLEATION OF THE PROSTATE: - BENIGN PROSTATIC STROMAL AND GLANDULAR HYPERPLASIA. - NEGATIVE FOR MALIGNANCY.  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 These findings support the above diagnosis.  IHC slides were prepared by [facility] for Molecular Biology and Pathology, [place], [place]. All controls stained appropriately.  This test was developed and its performance characteristics determined by [facility]. It has not been cleared or approved by the [place] Food and Drug Administration. The FDA does not require this test to go through premarket FDA review. This test is used for clinical purposes. It should not be regarded a s investigational or for research. This laboratory is certified under the Clinical Laboratory Improvement Amendments (CLIA) as qualified to perform high complexity clinical laboratory testing.   GROSS DESCRIPTION: A. Labeled: Prostate chips Received: Fresh Collection time: 9:32 AM on 04/15/2021 Placed into formalin time: 10:46 AM on 04/15/2021 Type of procedure: Enucleation of the prostate with morcellation Weight: 43.5 grams Size: 6.8 x 6.8 x 0.7 cm Description: Tan-pink, rubbery tissue fragments Percentage of tissue submitted for microscopic review: Approximately 60%  Block summary: 1 -13-representative sections  CM 04/15/2021  Final Diagnosis performed by Quay Burow, MD.   Electronically signed 04/20/2021 12:39:21PM The electronic signature indicates that the named Attending Pathologist has evaluated the specimen Technical component performed at [facility], [address], [place], [place] [address] Lab: [phone] Dir: Rush Farmer, MD, MMM  Professional compo nent performed at [facility], [facility], [place], [place], [place] [address] Lab: [phone] Dir: Kathi Simpers, MD         Current Outpatient Medications:    aspirin EC 81 MG tablet, Take 81 mg by mouth daily. Swallow whole., Disp: , Rfl:    atorvastatin (LIPITOR) 20 MG tablet, Take 1 tablet (20 mg total) by mouth daily., Disp: 100 tablet, Rfl: 0   benazepril (LOTENSIN) 40 MG tablet, Take 1 tablet (40 mg total) by mouth daily., Disp: 100 tablet, Rfl: 0   empagliflozin (JARDIANCE) 10 MG TABS tablet, Take 1 tablet (10 mg total) by mouth daily before breakfast., Disp: 30 tablet, Rfl: 3   fexofenadine (ALLEGRA ALLERGY) 180 MG tablet, Take 1 tablet (180 mg total) by mouth daily., Disp: 10 tablet, Rfl: 1   insulin detemir (LEVEMIR FLEXTOUCH) 100 UNIT/ML FlexPen, Inject 35 units into the skin at bedtime., Disp: 15 mL, Rfl: 6   Insulin Pen Needle 31G X 8 MM MISC,  Use one needle to administer insulin into skin nightly., Disp: 90 each, Rfl: 3   metFORMIN (GLUCOPHAGE) 1000 MG tablet, Take 1 tablet (1,000 mg total) by mouth 2 (two) times daily with a meal., Disp: 180 tablet, Rfl: 0   metoprolol tartrate (LOPRESSOR) 25 MG tablet, Take 0.5 tablets (12.5 mg total) by mouth daily., Disp: 15 tablet, Rfl: 0   Multiple Vitamins-Minerals (CENTRUM SILVER 50+MEN) TABS, Take 1 tablet by mouth daily., Disp: , Rfl:    vitamin B-12 (CYANOCOBALAMIN) 500 MCG tablet, Take 500 mcg by mouth daily., Disp: , Rfl:     Assessment & Plan:  DM levemir to cut back to 25 untis x 3 days  Then 15 untis x 3 days  Then 10 units x 3 days.  Start rybelsus today 3mg   Increase to 7 mg  x 30 mg days. Start pt on rybelsus for such increase jardiacne to 25 mg daily. A1c at 8.9 try to wean off of insulin and build up rybelsus if able to tolerate. check HbA1c,  urine  microalbumin  diabetic diet plan given to pt  adviced regarding hypoglycemia and instructions given to pt today on how to prevent and treat the same if it were to occur. pt acknowledges the plan and voices understanding of the same.  exercise plan given and encouraged.   advice  diabetic yearly podiatry, ophthalmology , nutritionist , dental check q 6 months  Problem List Items Addressed This Visit   None    No orders of the defined types were placed in this encounter.    No orders of the defined types were placed in this encounter.    Follow up plan: No follow-ups on file. Labs next visit : CBC, CMP, FLP, HBA1C, TSH, PSA, urine microalbumin Labs 1 week prior to next visit.

## 2021-04-23 LAB — COMPREHENSIVE METABOLIC PANEL
ALT: 15 IU/L (ref 0–44)
AST: 16 IU/L (ref 0–40)
Albumin/Globulin Ratio: 1.7 (ref 1.2–2.2)
Albumin: 4.2 g/dL (ref 3.7–4.7)
Alkaline Phosphatase: 89 IU/L (ref 44–121)
BUN/Creatinine Ratio: 20 (ref 10–24)
BUN: 27 mg/dL (ref 8–27)
Bilirubin Total: 0.3 mg/dL (ref 0.0–1.2)
CO2: 18 mmol/L — ABNORMAL LOW (ref 20–29)
Calcium: 9.2 mg/dL (ref 8.6–10.2)
Chloride: 100 mmol/L (ref 96–106)
Creatinine, Ser: 1.37 mg/dL — ABNORMAL HIGH (ref 0.76–1.27)
Globulin, Total: 2.5 g/dL (ref 1.5–4.5)
Glucose: 205 mg/dL — ABNORMAL HIGH (ref 70–99)
Potassium: 4.6 mmol/L (ref 3.5–5.2)
Sodium: 134 mmol/L (ref 134–144)
Total Protein: 6.7 g/dL (ref 6.0–8.5)
eGFR: 54 mL/min/{1.73_m2} — ABNORMAL LOW (ref >59)

## 2021-04-23 LAB — CBC WITH DIFFERENTIAL/PLATELET
Basophils Absolute: 0 10*3/uL (ref 0.0–0.2)
Basos: 1 %
EOS (ABSOLUTE): 0.2 10*3/uL (ref 0.0–0.4)
Eos: 2 %
Hematocrit: 40.3 % (ref 37.5–51.0)
Hemoglobin: 13.1 g/dL (ref 13.0–17.7)
Immature Grans (Abs): 0 10*3/uL (ref 0.0–0.1)
Immature Granulocytes: 0 %
Lymphocytes Absolute: 1.9 10*3/uL (ref 0.7–3.1)
Lymphs: 22 %
MCH: 28.9 pg (ref 26.6–33.0)
MCHC: 32.5 g/dL (ref 31.5–35.7)
MCV: 89 fL (ref 79–97)
Monocytes Absolute: 0.7 10*3/uL (ref 0.1–0.9)
Monocytes: 9 %
Neutrophils Absolute: 5.6 10*3/uL (ref 1.4–7.0)
Neutrophils: 66 %
Platelets: 288 10*3/uL (ref 150–450)
RBC: 4.53 x10E6/uL (ref 4.14–5.80)
RDW: 12.9 % (ref 11.6–15.4)
WBC: 8.4 10*3/uL (ref 3.4–10.8)

## 2021-04-23 LAB — LIPID PANEL
Chol/HDL Ratio: 3.3 ratio (ref 0.0–5.0)
Cholesterol, Total: 147 mg/dL (ref 100–199)
HDL: 44 mg/dL (ref >39)
LDL Chol Calc (NIH): 72 mg/dL (ref 0–99)
Triglycerides: 182 mg/dL — ABNORMAL HIGH (ref 0–149)
VLDL Cholesterol Cal: 31 mg/dL (ref 5–40)

## 2021-04-25 ENCOUNTER — Telehealth: Payer: Self-pay

## 2021-04-25 NOTE — Telephone Encounter (Signed)
-----   Message from Billey Co, MD sent at 04/21/2021  8:17 AM EST ----- Good news, no prostate cancer on HOLEP tissue, keep follow-up as scheduled  Nickolas Madrid, MD 04/21/2021

## 2021-04-25 NOTE — Telephone Encounter (Signed)
Called pt informed him of the information below. Pt voiced understanding.  

## 2021-04-26 DIAGNOSIS — M1990 Unspecified osteoarthritis, unspecified site: Secondary | ICD-10-CM

## 2021-04-26 DIAGNOSIS — E1169 Type 2 diabetes mellitus with other specified complication: Secondary | ICD-10-CM

## 2021-04-26 DIAGNOSIS — Z794 Long term (current) use of insulin: Secondary | ICD-10-CM

## 2021-04-26 DIAGNOSIS — I152 Hypertension secondary to endocrine disorders: Secondary | ICD-10-CM | POA: Diagnosis not present

## 2021-04-26 DIAGNOSIS — E1165 Type 2 diabetes mellitus with hyperglycemia: Secondary | ICD-10-CM | POA: Diagnosis not present

## 2021-04-26 DIAGNOSIS — I10 Essential (primary) hypertension: Secondary | ICD-10-CM | POA: Diagnosis not present

## 2021-04-26 DIAGNOSIS — E1159 Type 2 diabetes mellitus with other circulatory complications: Secondary | ICD-10-CM | POA: Diagnosis not present

## 2021-04-26 DIAGNOSIS — N400 Enlarged prostate without lower urinary tract symptoms: Secondary | ICD-10-CM

## 2021-04-26 DIAGNOSIS — E785 Hyperlipidemia, unspecified: Secondary | ICD-10-CM | POA: Diagnosis not present

## 2021-05-13 ENCOUNTER — Ambulatory Visit: Payer: Medicare HMO | Admitting: Internal Medicine

## 2021-05-31 ENCOUNTER — Telehealth: Payer: Self-pay

## 2021-05-31 ENCOUNTER — Telehealth: Payer: Medicare HMO

## 2021-05-31 NOTE — Telephone Encounter (Signed)
?  Care Management  ? ?Follow Up Note ? ? ?05/31/2021 ?Name: DWON SKY MRN: [id number] DOB: September 03, 1945 ? ? ?Referred by: Charlynne Cousins, MD ?Reason for referral : Chronic Care Management (RNCM: Follow up for Chronic Disease Management and Care Coordination Needs ) ? ? ?An unsuccessful telephone outreach was attempted today. The patient was referred to the case management team for assistance with care management and care coordination.  ? ?Follow Up Plan: A HIPPA compliant phone message was left for the patient providing contact information and requesting a return call.  ? ?Noreene Larsson RN, MSN, CCM ?Community Care Coordinator ?[facility] ?[place] ?Mobile: [phone]  ?

## 2021-06-07 ENCOUNTER — Other Ambulatory Visit: Payer: Self-pay | Admitting: Internal Medicine

## 2021-06-07 DIAGNOSIS — E119 Type 2 diabetes mellitus without complications: Secondary | ICD-10-CM

## 2021-06-07 NOTE — Telephone Encounter (Signed)
Requested Prescriptions  ?Pending Prescriptions Disp Refills  ?? metFORMIN (GLUCOPHAGE) 1000 MG tablet [Pharmacy Med Name: METFORMIN HCL 1,000 MG TABLET] 180 tablet 0  ?  Sig: Take 1 tablet (1,000 mg total) by mouth 2 (two) times daily with a meal.  ?  ? Endocrinology:  Diabetes - Biguanides Failed - 06/07/2021 10:54 AM  ?  ?  Failed - Cr in normal range and within 360 days  ?  Creatinine, Ser  ?Date Value Ref Range Status  ?04/22/2021 1.37 (H) 0.76 - 1.27 mg/dL Final  ?   ?  ?  Failed - HBA1C is between 0 and 7.9 and within 180 days  ?  HB A1C (BAYER DCA - WAIVED)  ?Date Value Ref Range Status  ?04/22/2021 8.9 (H) 4.8 - 5.6 % Final  ?  Comment:  ?           Prediabetes: 5.7 - 6.4 ?         Diabetes: >6.4 ?         Glycemic control for adults with diabetes: <7.0 ?  ?   ?  ?  Failed - eGFR in normal range and within 360 days  ?  GFR calc Af Amer  ?Date Value Ref Range Status  ?09/19/2019 53 (L) >59 mL/min/1.73 Final  ?  Comment:  ?  **[facility] currently reports eGFR in compliance with the current** ?  recommendations of the [employer]. [facility] will ?  update reporting as new guidelines are published from the NKF-ASN ?  Task force. ?  ? ?GFR, Estimated  ?Date Value Ref Range Status  ?03/19/2021 55 (L) >60 mL/min Final  ?  Comment:  ?  (NOTE) ?Calculated using the CKD-EPI Creatinine Equation (2021) ?  ? ?eGFR  ?Date Value Ref Range Status  ?04/22/2021 54 (L) >59 mL/min/1.73 Final  ?   ?  ?  Passed - B12 Level in normal range and within 720 days  ?  Vitamin B-12  ?Date Value Ref Range Status  ?11/09/2020 463 232 - 1,245 pg/mL Final  ?   ?  ?  Passed - Valid encounter within last 6 months  ?  Recent Outpatient Visits   ?      ? 1 month ago Diabetes mellitus without complication ([place])  ? [facility] [name], Avanti, MD  ? 2 months ago Localized swelling of both lower extremities  ? [facility] [name], Avanti, MD  ? 2 months ago SVT (supraventricular tachycardia) ([place])  ? [facility] [name], Avanti, MD  ? 4 months ago Type 2 diabetes mellitus with hyperglycemia, with long-term current use of insulin ([place])  ? [facility] [name], Avanti, MD  ? 7 months ago Diabetes mellitus without complication ([place])  ? [facility] [name], Avanti, MD  ?  ?  ?Future Appointments   ?        ? In 2 weeks [name], Avanti, MD [facility]  ?  ? ?  ?  ?  Passed - CBC within normal limits and completed in the last 12 months  ?  WBC  ?Date Value Ref Range Status  ?04/22/2021 8.4 3.4 - 10.8 x10E3/uL Final  ?03/19/2021 11.4 (H) 4.0 - 10.5 K/uL Final  ? ?RBC  ?Date Value Ref Range Status  ?04/22/2021 4.53 4.14 - 5.80 x10E6/uL Final  ?03/19/2021 3.74 (L) 4.22 - 5.81 MIL/uL Final  ? ?Hemoglobin  ?Date Value Ref Range Status  ?04/22/2021 13.1 13.0 - 17.7  g/dL Final  ? ?Hematocrit  ?Date Value Ref Range Status  ?04/22/2021 40.3 37.5 - 51.0 % Final  ? ?MCHC  ?Date Value Ref Range Status  ?04/22/2021 32.5 31.5 - 35.7 g/dL Final  ?03/19/2021 33.4 30.0 - 36.0 g/dL Final  ? ?MCH  ?Date Value Ref Range Status  ?04/22/2021 28.9 26.6 - 33.0 pg Final  ?03/19/2021 29.4 26.0 - 34.0 pg Final  ? ?MCV  ?Date Value Ref Range Status  ?04/22/2021 89 79 - 97 fL Final  ? ?No results found for: PLTCOUNTKUC, LABPLAT, [place] ?RDW  ?Date Value Ref Range Status  ?04/22/2021 12.9 11.6 - 15.4 % Final  ? ?  ?  ?  ? ? ?

## 2021-06-20 ENCOUNTER — Other Ambulatory Visit: Payer: Self-pay | Admitting: Nurse Practitioner

## 2021-06-20 DIAGNOSIS — E7800 Pure hypercholesterolemia, unspecified: Secondary | ICD-10-CM

## 2021-06-20 DIAGNOSIS — I10 Essential (primary) hypertension: Secondary | ICD-10-CM

## 2021-06-20 NOTE — Telephone Encounter (Signed)
Requested Prescriptions  ?Pending Prescriptions Disp Refills  ?? benazepril (LOTENSIN) 40 MG tablet [Pharmacy Med Name: BENAZEPRIL HCL 40 MG TABLET] 100 tablet 0  ?  Sig: Take 1 tablet (40 mg total) by mouth daily.  ?  ? Cardiovascular:  ACE Inhibitors Failed - 06/20/2021 11:47 AM  ?  ?  Failed - Cr in normal range and within 180 days  ?  Creatinine, Ser  ?Date Value Ref Range Status  ?04/22/2021 1.37 (H) 0.76 - 1.27 mg/dL Final  ?   ?  ?  Passed - K in normal range and within 180 days  ?  Potassium  ?Date Value Ref Range Status  ?04/22/2021 4.6 3.5 - 5.2 mmol/L Final  ?   ?  ?  Passed - Patient is not pregnant  ?  ?  Passed - Last BP in normal range  ?  BP Readings from Last 1 Encounters:  ?04/22/21 116/71  ?   ?  ?  Passed - Valid encounter within last 6 months  ?  Recent Outpatient Visits   ?      ? 1 month ago Diabetes mellitus without complication ([place])  ? [facility] [name], Avanti, MD  ? 2 months ago Localized swelling of both lower extremities  ? [facility] [name], Avanti, MD  ? 3 months ago SVT (supraventricular tachycardia) ([place])  ? [facility] [name], Avanti, MD  ? 4 months ago Type 2 diabetes mellitus with hyperglycemia, with long-term current use of insulin ([place])  ? [facility] [name], Avanti, MD  ? 7 months ago Diabetes mellitus without complication ([place])  ? [facility] [name], Avanti, MD  ?  ?  ?Future Appointments   ?        ? In 3 days [name], Avanti, MD [facility]  ?  ? ?  ?  ?  ? ? ?

## 2021-06-21 NOTE — Telephone Encounter (Signed)
Requested Prescriptions  ?Pending Prescriptions Disp Refills  ?? atorvastatin (LIPITOR) 20 MG tablet [Pharmacy Med Name: ATORVASTATIN 20 MG TABLET] 100 tablet 0  ?  Sig: Take 1 tablet (20 mg total) by mouth daily.  ?  ? Cardiovascular:  Antilipid - Statins Failed - 06/20/2021 11:47 AM  ?  ?  Failed - Lipid Panel in normal range within the last 12 months  ?  Cholesterol, Total  ?Date Value Ref Range Status  ?04/22/2021 147 100 - 199 mg/dL Final  ? ?Cholesterol [place], [place]  ?Date Value Ref Range Status  ?03/19/2019 150 <200 mg/dL Final  ?  Comment:  ?                          Desirable                <200 ?                        Borderline High      200- 239 ?                        High                     >239 ?  ? ?LDL Chol Calc (NIH)  ?Date Value Ref Range Status  ?04/22/2021 72 0 - 99 mg/dL Final  ? ?HDL  ?Date Value Ref Range Status  ?04/22/2021 44 >39 mg/dL Final  ? ?Triglycerides  ?Date Value Ref Range Status  ?04/22/2021 182 (H) 0 - 149 mg/dL Final  ? ?Triglycerides Piccolo,Waived  ?Date Value Ref Range Status  ?03/19/2019 224 (H) <150 mg/dL Final  ?  Comment:  ?                          Normal                   <150 ?                        Borderline High     150 - 199 ?                        High                200 - 499 ?                        Very High                >499 ?  ? ?  ?  ?  Passed - Patient is not pregnant  ?  ?  Passed - Valid encounter within last 12 months  ?  Recent Outpatient Visits   ?      ? 2 months ago Diabetes mellitus without complication ([place])  ? [facility] [name], Avanti, MD  ? 2 months ago Localized swelling of both lower extremities  ? [facility] [name], Avanti, MD  ? 3 months ago SVT (supraventricular tachycardia) ([place])  ? [facility] [name], Avanti, MD  ? 4 months ago Type 2 diabetes mellitus with hyperglycemia, with long-term current use of insulin ([place])  ? [facility] [name], Avanti, MD  ? 7 months ago Diabetes mellitus  without complication ([place])  ? [facility] [name], Avanti, MD  ?  ?  ?Future Appointments   ?        ? In 2 days [name], Avanti, MD [facility]  ?  ? ?  ?  ?  ? ?

## 2021-06-23 ENCOUNTER — Ambulatory Visit: Payer: Medicare HMO | Admitting: Internal Medicine

## 2021-06-27 ENCOUNTER — Ambulatory Visit: Payer: Medicare HMO

## 2021-07-07 ENCOUNTER — Encounter: Payer: Self-pay | Admitting: Urology

## 2021-07-20 ENCOUNTER — Telehealth: Payer: Self-pay

## 2021-07-20 NOTE — Chronic Care Management (AMB) (Signed)
    Chronic Care Management Pharmacy Assistant   Name: Matthew [name]  MRN: [id number] DOB: 09-29-1945  Reason for Encounter: Disease State Diabetes Mellitus   Recent office visits:  04/22/21-Avanti Matthew Dear, MD (PCP) Seen for medication consultation. Follow up in 3 weeks. 03/31/21-Avanti [name], MD (PCP) Hospital follow up visit. Chest x-ray ordered. Follow up in 2 weeks. 03/17/21-Avanti [name], MD (PCP) Seen diabetic and back pain visit.  01/31/22-Avanti [name], MD (PCP) General follow up visit. Labs ordered. Follow up in 6 weeks.  Recent consult visits:  04/11/21-Matthew Lenn Sink, MD (Cardiology) Seen for pre-op exam. EKG ordered. Follow up in 2 months. 03/23/21-Matthew C. Diamantina Providence, MD (Urology) Follow up visit for flank pain, pyelonephritis.  Hospital visits:  Medication Reconciliation was completed by comparing discharge summary, patient's EMR and Pharmacy list, and upon discussion with patient.  Admitted to the hospital on 03/17/21 due to sever sepsos. Discharge date was 03/19/21. Discharged from [facility]?Medications Started at [facility] Discharge:?? -started none noted  Medication Changes at Hospital Discharge: -Changed none noted  Medications Discontinued at Hospital Discharge: -Stopped none noted  Medications that remain the same after Hospital Discharge:??  -All other medications will remain the same.    Medications: Outpatient Encounter Medications as of 07/20/2021  Medication Sig   aspirin EC 81 MG tablet Take 81 mg by mouth daily. Swallow whole.   atorvastatin (LIPITOR) 20 MG tablet Take 1 tablet (20 mg total) by mouth daily.   benazepril (LOTENSIN) 40 MG tablet Take 1 tablet (40 mg total) by mouth daily.   empagliflozin (JARDIANCE) 25 MG TABS tablet Take 1 tablet (25 mg total) by mouth daily before breakfast.   fexofenadine (ALLEGRA ALLERGY) 180 MG tablet Take 1 tablet (180 mg total) by mouth daily.   insulin detemir (LEVEMIR FLEXTOUCH) 100  UNIT/ML FlexPen Inject 35 units into the skin at bedtime.   Insulin Pen Needle 31G X 8 MM MISC Use one needle to administer insulin into skin nightly.   metFORMIN (GLUCOPHAGE) 1000 MG tablet Take 1 tablet (1,000 mg total) by mouth 2 (two) times daily with a meal.   metoprolol tartrate (LOPRESSOR) 25 MG tablet Take 0.5 tablets (12.5 mg total) by mouth daily.   Multiple Vitamins-Minerals (CENTRUM SILVER 50+MEN) TABS Take 1 tablet by mouth daily.   Semaglutide (RYBELSUS) 3 MG TABS Take 3 mg by mouth daily.   vitamin B-12 (CYANOCOBALAMIN) 500 MCG tablet Take 500 mcg by mouth daily.   No facility-administered encounter medications on file as of 07/20/2021.   Called patient left voicemail to return phone call.    Adherence Review: Is the patient currently on a STATIN medication? Yes Is the patient currently on ACE/ARB medication? Yes Does the patient have >5 day gap between last estimated fill dates? No   Care Gaps: TETANUS/TDAP:Last completed: Mar 15, 2011 COLONOSCOPY:Last completed: Jun 29, 2011  Star Rating Drugs: Atorvastatin 20 mg Last filled:03/17/21 100 DS Benazepril 40 mg Last filled:03/17/21 100 DS Metformin 1000 mg Last filled:06/07/21 90 DS Jardiance 25 mg Last filled:06/07/21 30 DS  Matthew [name], Matthew [name]

## 2021-07-27 ENCOUNTER — Ambulatory Visit: Payer: Medicare HMO | Admitting: Urology

## 2021-08-02 ENCOUNTER — Telehealth: Payer: Self-pay

## 2021-08-02 ENCOUNTER — Telehealth: Payer: Medicare HMO

## 2021-08-02 NOTE — Telephone Encounter (Signed)
  Care Management   Follow Up Note   08/02/2021 Name: Matthew [name] MRN: [id number] DOB: Aug 17, 1945   Referred by: Charlynne Cousins, MD Reason for referral : Chronic Care Management (RNCM: Follow up for Chronic Disease Management and Care Coordination Needs)   An unsuccessful telephone outreach was attempted today. The patient was referred to the case management team for assistance with care management and care coordination.   Follow Up Plan: A HIPPA compliant phone message was left for the patient providing contact information and requesting a return call.   Noreene Larsson RN, MSN, [facility] Mobile: [phone]

## 2021-08-17 ENCOUNTER — Ambulatory Visit: Payer: Medicare HMO | Admitting: Urology

## 2021-08-19 ENCOUNTER — Telehealth: Payer: Medicare HMO

## 2021-08-19 ENCOUNTER — Telehealth: Payer: Self-pay

## 2021-09-14 ENCOUNTER — Ambulatory Visit: Payer: Medicare HMO | Admitting: Urology

## 2021-09-14 ENCOUNTER — Other Ambulatory Visit: Payer: Self-pay | Admitting: Internal Medicine

## 2021-09-14 DIAGNOSIS — E119 Type 2 diabetes mellitus without complications: Secondary | ICD-10-CM

## 2021-09-14 MED ORDER — METFORMIN HCL 1000 MG PO TABS: 1000.0000 mg | ORAL_TABLET | Freq: Two times a day (BID) | ORAL | 0 refills | Status: DC

## 2021-09-14 NOTE — Telephone Encounter (Signed)
Medication Refill - Medication: metFORMIN (GLUCOPHAGE) 1000 MG tablet  Has the patient contacted their pharmacy? Yes.    (Agent: If yes, when and what did the pharmacy advise?) call provider  Preferred Pharmacy (with phone number or street name):  [place], [place] Phone:  [phone]  Fax:  [phone]     Has the patient been seen for an appointment in the last year OR does the patient have an upcoming appointment? Yes.    Agent: Please be advised that RX refills may take up to 3 business days. We ask that you follow-up with your pharmacy.

## 2021-09-14 NOTE — Telephone Encounter (Signed)
    LOV 04/22/21  Up coming visit 09/22/21 with Junie Panning Mecum

## 2021-09-15 ENCOUNTER — Encounter: Payer: Self-pay | Admitting: Urology

## 2021-09-22 ENCOUNTER — Ambulatory Visit (INDEPENDENT_AMBULATORY_CARE_PROVIDER_SITE_OTHER): Payer: Medicare HMO | Admitting: Physician Assistant

## 2021-09-22 VITALS — BP 125/80 | HR 101 | Temp 98.2°F | Ht 69.69 in | Wt 232.6 lb

## 2021-09-22 DIAGNOSIS — M545 Low back pain, unspecified: Secondary | ICD-10-CM

## 2021-09-22 DIAGNOSIS — Z794 Long term (current) use of insulin: Secondary | ICD-10-CM | POA: Diagnosis not present

## 2021-09-22 DIAGNOSIS — E1165 Type 2 diabetes mellitus with hyperglycemia: Secondary | ICD-10-CM

## 2021-09-22 DIAGNOSIS — I10 Essential (primary) hypertension: Secondary | ICD-10-CM

## 2021-09-22 DIAGNOSIS — E119 Type 2 diabetes mellitus without complications: Secondary | ICD-10-CM

## 2021-09-22 LAB — BAYER DCA HB A1C WAIVED: HB A1C (BAYER DCA - WAIVED): 11.2 % — ABNORMAL HIGH (ref 4.8–5.6)

## 2021-09-22 MED ORDER — EMPAGLIFLOZIN 25 MG PO TABS: 25.0000 mg | ORAL_TABLET | Freq: Every day | ORAL | 4 refills | Status: DC

## 2021-09-22 MED ORDER — RYBELSUS 3 MG PO TABS: 3.0000 mg | ORAL_TABLET | Freq: Every day | ORAL | 1 refills | Status: DC

## 2021-09-22 MED ORDER — LIDOCAINE 5 % EX PTCH: 1.0000 | MEDICATED_PATCH | CUTANEOUS | 0 refills | Status: DC

## 2021-09-22 NOTE — Patient Instructions (Addendum)
Please follow up in 3 months for your diabetes   I would like you to do the following  Take your blood sugar everyday  I would like you to increase your levemir to 35 units every night  I am starting you on Rybelsus 3 mg to be taken by mouth once per day. If you have a reaction to this, stop taking it and call the office. If you have any skin reactions, trouble breathing, sweating or tongue/chin swelling please go to the ED.    The chronic care management team should be reaching out to you to discuss your care and make sure you are getting your medications  I will reach out and have the pharmacist call you soon to set these back up again.This is a great service to help with medication questions and coverage help.    If you want you can get a pedicure performed at a nail salon. Make sure they have a high sanitation rating and only let them use the pumice stone on your feet to remove calluses   When you get your eyes checked at the eye doctor, please have them send a copy of the diabetic eye exam results to the office.

## 2021-09-22 NOTE — Progress Notes (Signed)
Established Patient Office Visit  Name: Matthew [name]   MRN: [id number]    DOB: [birth year]   Date:09/23/2021  Today's Provider: Talitha Givens, MHS, PA-C Introduced myself to the patient as a PA-C and provided education on APPs in clinical practice.         Subjective  Chief Complaint  Chief Complaint  Patient presents with   Diabetes    HPI  Diabetes, Type 2 - Last A1c 8.9 in Feb 2023 - Medications: jardiance 25 mg, Metformin 1000 mg PO BID, Levemir 25 units  - Compliance: Taking these medications as directed  - Checking BG at home: not checking at home  - Diet: Admits he has been eating without regard to diabetes for the past 3 months - Exercise: he is not engaged in regular exercise. 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 Discussed having results sent to office to update HM  - Foot exam: complete today  - Microalbumin:  - Statin: Taking Atorvastatin 20 mg  - PNA vaccine:  - Denies symptoms of hypoglycemia, polyuria, polydipsia, numbness extremities, foot ulcers/trauma  HYPERLIPIDEMIA Hyperlipidemia status: excellent compliance Satisfied with current treatment?  yes Side effects:  no Medication compliance: excellent compliance Past cholesterol meds: atorvastain (lipitor) Supplements: none Aspirin:  yes The 10-year ASCVD risk score (Arnett DK, et al., 2019) is: 41.5%   Values used to calculate the score:     Age: 76 years     Sex: Male     Is Non-Hispanic African American: No     Diabetic: Yes     Tobacco smoker: No     Systolic Blood Pressure: 741 mmHg     Is BP treated: Yes     HDL Cholesterol: 44 mg/dL     Total Cholesterol: 147 mg/dL Chest pain:  no    Hypertension: - Medications: Benazepril 40 mg,  - Compliance: excellent  - Checking BP at home: sporadically checks it  - Denies any SOB, CP, vision changes, LE edema, medication SEs, or symptoms of  hypotension     Arthritis  Reports he has arthritis in knees, hips and degenerative disc disease  States he would like Lidocaine patches to assist with pain Previous interventions: muscle relaxers and pain pills from previous surgeries and heating pad     Patient Active Problem List   Diagnosis Date Noted   Acute bilateral low back pain without sciatica 03/21/2021   Elevated blood sugar 03/21/2021   Ureteral obstruction, left    AKI (acute kidney injury) ([place]) 63/84/5364   Complicated UTI (urinary tract infection) 03/17/2021   Sepsis ([place]) 03/17/2021   Hydronephrosis of left kidney 03/17/2021   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   SVT (supraventricular tachycardia) ([place]) 03/17/2021   Severe sepsis ([place]) 03/17/2021   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   Diabetes mellitus without complication ([place]) 68/04/2120   Hip pain 11/09/2020   Back pain 11/09/2020   Chronic pain of both knees 11/09/2020   Stage 3a chronic kidney disease ([place]) 10/06/2020   Arthritis 10/06/2020   Morbid obesity ([place]) 07/17/2020   CKD stage 3 due to type 2 diabetes mellitus ([place]) 03/13/2020   BPH (benign prostatic hyperplasia) 12/30/2014   Type 2 diabetes mellitus with hyperglycemia ([place])    Hyperlipidemia associated with type 2 diabetes mellitus ([place])    HTN (hypertension)     Past Surgical History:  Procedure Laterality Date   COLONOSCOPY     CYSTOSCOPY W/ RETROGRADES Left 04/15/2021   Procedure: CYSTOSCOPY WITH RETROGRADE PYELOGRAM;  Surgeon: Billey Co, MD;  Location: [facility] ORS;  Service: Urology;  Laterality: Left;   HERNIA REPAIR     inguinal   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 N/A 04/15/2021   Procedure: 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;  Surgeon: Billey Co, MD;  Location: [facility] ORS;  Service: Urology;  Laterality: N/A;   KNEE ARTHROSCOPY Right 12/23/2020   Procedure: RIGHT ARTHROSCOPY KNEE PARTIAL MEDIAL AND LATERAL MENISCECTOMY;  Surgeon:  Thornton Park, MD;  Location: [facility] ORS;  Service: Orthopedics;  Laterality: Right;    Family History  Problem Relation Age of Onset   Diabetes Mother    Hypertension Father    Diabetes Sister    Diabetes Brother    Diabetes Maternal Grandmother     Social History   Tobacco Use   Smoking status: Never   Smokeless tobacco: Never  Substance Use Topics   Alcohol use: No    Comment: rare     Current Outpatient Medications:    aspirin EC 81 MG tablet, Take 81 mg by mouth daily. Swallow whole., Disp: , Rfl:    atorvastatin (LIPITOR) 20 MG tablet, Take 1 tablet (20 mg total) by mouth daily., Disp: 100 tablet, Rfl: 0   benazepril (LOTENSIN) 40 MG tablet, Take 1 tablet (40 mg total) by mouth daily., Disp: 100 tablet, Rfl: 0   insulin detemir (LEVEMIR FLEXTOUCH) 100 UNIT/ML FlexPen, Inject 35 units into the skin at bedtime., Disp: 15 mL, Rfl: 6   Insulin Pen Needle 31G X 8 MM MISC, Use one needle to administer insulin into skin nightly., Disp: 90 each, Rfl: 3   lidocaine (LIDODERM) 5 %, Place 1 patch onto the skin daily. Remove & Discard patch within 12 hours or as directed by MD, Disp: 15 patch, Rfl: 0   metFORMIN (GLUCOPHAGE) 1000 MG tablet, Take 1 tablet (1,000 mg total) by mouth 2 (two) times daily with a meal., Disp: 180 tablet, Rfl: 0   Multiple Vitamins-Minerals (CENTRUM SILVER 50+MEN) TABS, Take 1 tablet by mouth daily., Disp: , Rfl:    Semaglutide (RYBELSUS) 3 MG TABS, Take 3 mg by mouth daily., Disp: 30 tablet, Rfl: 1   vitamin B-12 (CYANOCOBALAMIN) 500 MCG tablet, Take 500 mcg by mouth daily., Disp: , Rfl:    empagliflozin (JARDIANCE) 25 MG TABS tablet, Take 1 tablet (25 mg total) by mouth daily before breakfast., Disp: 30 tablet, Rfl: 4   fexofenadine (ALLEGRA ALLERGY) 180 MG tablet, Take 1 tablet (180 mg total) by mouth daily. (Patient not taking: Reported on 09/22/2021), Disp: 10 tablet, Rfl: 1   metoprolol tartrate (LOPRESSOR) 25 MG tablet, Take 0.5 tablets (12.5 mg total)  by mouth daily., Disp: 15 tablet, Rfl: 0  Allergies  Allergen Reactions   Ozempic (0.25 Or 0.5 Mg-Dose) [Semaglutide(0.25 Or 0.'5mg'$ -Dos)] Swelling    I personally reviewed active problem list, medication list, allergies, health maintenance, notes from last encounter, notes from last 3 encounters, lab results with the patient/caregiver today.   Review of Systems  Constitutional:  Negative for chills, diaphoresis and fever.  Eyes:  Negative for blurred vision and double vision.  Respiratory:  Negative for shortness of breath and wheezing.   Cardiovascular:  Negative for chest pain, palpitations and leg swelling.  Gastrointestinal:  Negative for diarrhea, nausea and vomiting.  Neurological:  Negative for dizziness and headaches.  Objective  Vitals:   09/22/21 1527  BP: 125/80  Pulse: (!) 101  Temp: 98.2 F (36.8 C)  TempSrc: Oral  SpO2: 97%  Weight: 232 lb 9.6 oz (105.5 kg)  Height: 5' 9.69" (1.77 m)    Body mass index is 33.68 kg/m.  Physical Exam Vitals reviewed.  Constitutional:      General: He is awake.     Appearance: Normal appearance. He is well-developed and well-groomed. He is obese.  HENT:     Head: Normocephalic and atraumatic.  Cardiovascular:     Rate and Rhythm: Normal rate and regular rhythm.     Pulses:          Radial pulses are 2+ on the right side and 2+ on the left side.       Dorsalis pedis pulses are 2+ on the right side and 2+ on the left side.     Heart sounds: Normal heart sounds. No murmur heard.    No friction rub. No gallop.  Pulmonary:     Effort: Pulmonary effort is normal.     Breath sounds: Normal breath sounds. No decreased air movement. No decreased breath sounds, wheezing, rhonchi or rales.  Musculoskeletal:     Cervical back: Normal range of motion and neck supple.     Right lower leg: No edema.     Left lower leg: No edema.  Neurological:     Mental Status: He is alert.  Psychiatric:        Attention and Perception:  Attention normal.        Mood and Affect: Mood normal.        Speech: Speech normal.        Behavior: Behavior normal. Behavior is cooperative.      Recent Results (from the past 2160 hour(s))  Bayer DCA Hb A1c Waived (STAT)     Status: Abnormal   Collection Time: 09/22/21  3:33 PM  Result Value Ref Range   HB A1C (BAYER DCA - WAIVED) 11.2 (H) 4.8 - 5.6 %    Comment:          Prediabetes: 5.7 - 6.4          Diabetes: >6.4          Glycemic control for adults with diabetes: <7.0      PHQ2/9:    04/22/2021    9:35 AM 03/17/2021    4:00 PM 01/31/2021   11:30 AM 01/17/2021    8:34 AM 10/06/2020    9:59 AM  Depression screen PHQ 2/9  Decreased Interest 0 0 0 0 0  Down, Depressed, Hopeless 0 0 0 0 0  PHQ - 2 Score 0 0 0 0 0  Altered sleeping 0 3 3    Tired, decreased energy 0 2 0    Change in appetite 0 2 0    Feeling bad or failure about yourself  0 0 0    Trouble concentrating 0 2 0    Moving slowly or fidgety/restless 0 0 0    Suicidal thoughts 0 0 0    PHQ-9 Score 0 9 3    Difficult doing work/chores Not difficult at all Not difficult at all Not difficult at all        Fall Risk:    04/22/2021    9:34 AM 03/17/2021    4:00 PM 01/31/2021   11:29 AM 01/31/2021   11:14 AM 01/17/2021    8:21 AM  [place]  in the past year? 1 0 '1 1 1  '$ Number falls in past yr: 1 0 1 1 0  Injury with Fall? 1 0 '1 1 1  '$ Risk for fall due to :  No Fall Risks     Follow up Falls evaluation completed Falls evaluation completed Falls evaluation completed Falls evaluation completed Falls evaluation completed;Falls prevention discussed      Functional Status Survey:      Assessment & Plan  Problem List Items Addressed This Visit       Cardiovascular and Mediastinum   HTN (hypertension)    Chronic, historic condition  Appears well managed on Benazepril 40 mg PO QD He is on statin therapy Will recheck Lipid panel at next apt  Continue with current medications  Follow up in 3  months for monitoring or sooner if concerns arise.         Endocrine   Type 2 diabetes mellitus with hyperglycemia (HCC) - Primary    Chronic, historic condition  Patient is taking Metformin 1000 mg PO BID, Jardiance 25 mg PO QD, Levemir 25 units at bedtime A1c is 11.2 today - increased from last time. Review of previous note reveals plan to start Rybelsus and Levemir reduced? Will increase Levemir to 35 units today and add Rybelsus 3 mg May need to relax A1c goal to 8.0 given patient's age and DM understanding  Recommend follow up in 3 months for monitoring or sooner if concerns arise Discussed more active participation in CCM services to help with DM management - patient seemed receptive to this service       Relevant Medications   Semaglutide (RYBELSUS) 3 MG TABS   empagliflozin (JARDIANCE) 25 MG TABS tablet   Other Relevant Orders   Bayer DCA Hb A1c Waived (STAT) (Completed)     Other   Back pain    Chronic concern, recurrent flares Reports he has arthritis in several joints and hx of back pain  With active daily volunteering he is having some difficulty with back pain flares Requests Lidocaine patches to assist with pain flares He typically uses Warm compresses, rest and Tylenol but these result in limited relief Reviewed use of Lidocaine patches - refill of 15 provided today.  Follow up as needed for concerns       Relevant Medications   lidocaine (LIDODERM) 5 %     Return in about 3 months (around 12/23/2021) for Diabetes follow up, HTN.   I, Tiane Szydlowski E Markita Stcharles, PA-C, have reviewed all documentation for this visit. The documentation on 09/23/21 for the exam, diagnosis, procedures, and orders are all accurate and complete.   Talitha Givens, MHS, PA-C [facility]

## 2021-09-23 NOTE — Assessment & Plan Note (Signed)
Chronic, historic condition  Patient is taking Metformin 1000 mg PO BID, Jardiance 25 mg PO QD, Levemir 25 units at bedtime A1c is 11.2 today - increased from last time. Review of previous note reveals plan to start Rybelsus and Levemir reduced? Will increase Levemir to 35 units today and add Rybelsus 3 mg May need to relax A1c goal to 8.0 given patient's age and DM understanding  Recommend follow up in 3 months for monitoring or sooner if concerns arise Discussed more active participation in CCM services to help with DM management - patient seemed receptive to this service

## 2021-09-23 NOTE — Assessment & Plan Note (Signed)
Chronic, historic condition  Appears well managed on Benazepril 40 mg PO QD He is on statin therapy Will recheck Lipid panel at next apt  Continue with current medications  Follow up in 3 months for monitoring or sooner if concerns arise.

## 2021-09-23 NOTE — Assessment & Plan Note (Signed)
Chronic concern, recurrent flares Reports he has arthritis in several joints and hx of back pain  With active daily volunteering he is having some difficulty with back pain flares Requests Lidocaine patches to assist with pain flares He typically uses Warm compresses, rest and Tylenol but these result in limited relief Reviewed use of Lidocaine patches - refill of 15 provided today.  Follow up as needed for concerns

## 2021-10-11 ENCOUNTER — Telehealth: Payer: Self-pay

## 2021-10-11 NOTE — Progress Notes (Signed)
Chronic Care Management Pharmacy Assistant   Name: GIOVAN PINSKY  MRN: [id number] DOB: 05-Dec-1945  Reason for Encounter: Disease State-General    Recent office visits:  09/22/21 Mecum, Dani Gobble, PA-C (Diabetes with hyperglycemia) Labs ordered; Medication changes: Lidocaine 5%, patient not taking Fexofenadine  Recent consult visits:  None since last coordination call 08/19/21  Hospital visits:  None since last coordination call 08/19/21  Medications: Outpatient Encounter Medications as of 10/11/2021  Medication Sig   aspirin EC 81 MG tablet Take 81 mg by mouth daily. Swallow whole.   atorvastatin (LIPITOR) 20 MG tablet Take 1 tablet (20 mg total) by mouth daily.   benazepril (LOTENSIN) 40 MG tablet Take 1 tablet (40 mg total) by mouth daily.   empagliflozin (JARDIANCE) 25 MG TABS tablet Take 1 tablet (25 mg total) by mouth daily before breakfast.   insulin detemir (LEVEMIR FLEXTOUCH) 100 UNIT/ML FlexPen Inject 35 units into the skin at bedtime.   Insulin Pen Needle 31G X 8 MM MISC Use one needle to administer insulin into skin nightly.   lidocaine (LIDODERM) 5 % Place 1 patch onto the skin daily. Remove & Discard patch within 12 hours or as directed by MD   metFORMIN (GLUCOPHAGE) 1000 MG tablet Take 1 tablet (1,000 mg total) by mouth 2 (two) times daily with a meal.   metoprolol tartrate (LOPRESSOR) 25 MG tablet Take 0.5 tablets (12.5 mg total) by mouth daily.   Multiple Vitamins-Minerals (CENTRUM SILVER 50+MEN) TABS Take 1 tablet by mouth daily.   Semaglutide (RYBELSUS) 3 MG TABS Take 3 mg by mouth daily.   vitamin B-12 (CYANOCOBALAMIN) 500 MCG tablet Take 500 mcg by mouth daily.   No facility-administered encounter medications on file as of 10/11/2021.   [place] for General Review Call   Chart Review:  Have there been any documented new, changed, or discontinued medications since last visit? Yes (If yes, include name, dose, frequency, date) 09/22/21 Medication  changes: Lidocaine 5%, patient not taking Fexofenadine Has there been any documented recent hospitalizations or ED visits since last visit with Clinical Pharmacist? No Brief Summary (including medication and/or Diagnosis changes):   Adherence Review:  Does the Clinical Pharmacist Assistant have access to adherence rates? Yes Adherence rates for STAR metric medications (List medication(s)/day supply/ last 2 fill dates). Adherence rates for medications indicated for disease state being reviewed (List medication(s)/day supply/ last 2 fill dates). Does the patient have >5 day gap between last estimated fill dates for any of the above medications or other medication gaps? No Reason for medication gaps.   Disease State Questions:  Able to connect with Patient? Yes  Did patient have any problems with their health recently? No  Have you had any admissions or emergency room visits or worsening of your condition(s) since last visit? No  Have you had any visits with new specialists or providers since your last visit? No  Have you had any new health care problem(s) since your last visit? No New problem(s) reported:  Have you run out of any of your medications since you last spoke with clinical pharmacist? No What caused you to run out of your medications?  Are there any medications you are not taking as prescribed? Yes What kept you from taking your medications as prescribed?Patient states that he is not taking Rybelsus because it it a tier 3 medication and to expensive. Also he is not taking because he believes that it is the exact same medication as Ozempic just  in pill form and he is allergic to Ozempic. He would like Rybelsus removed from his medication list.  Are you having any issues or side effects with your medications? No Note of issues or side effects:  Do you have any other health concerns or questions you want to discuss with your Clinical Pharmacist before your next visit?  No Note additional concerns and questions from Patient.  Are there any health concerns that you feel we can do a better job addressing? No Note Patient's response.  Are you having any problems with any of the following since the last visit: (select all that apply)  None  Details:  12. Any falls since last visit? No  Details:  13. Any increased or uncontrolled pain since last visit? No  Details:  14. Next visit Type: office       Visit with:Erin Mecum        Date:12/23/21        Time:2:40pm  15. Additional Details? No    Care Gaps: Colonoscopy-06/29/11 Diabetic Foot Exam-09/22/21 Ophthalmology-07/21/20 Dexa Scan - NA Annual Well Visit -  Micro albumin-04/22/21 Hemoglobin A1c- 09/22/21  Star Rating Drugs: Atorvastatin 20 mg-last fill (Patient just called for refill today)  Benazepril 40 mg-last fill (Patient just called for refill today) Jardiance 25 mg-last fill 09/22/21 30 ds Metformin 1000 mg-last fill 09/14/21 90 ds  Rybelsus 3 mg-last fill (Patient not taking anymore)  Ethelene Hal Clinical Pharmacist Assistant [phone]

## 2021-10-17 ENCOUNTER — Other Ambulatory Visit: Payer: Self-pay | Admitting: Family

## 2021-10-17 ENCOUNTER — Other Ambulatory Visit: Payer: Self-pay | Admitting: Nurse Practitioner

## 2021-10-17 DIAGNOSIS — I10 Essential (primary) hypertension: Secondary | ICD-10-CM

## 2021-10-17 DIAGNOSIS — E7800 Pure hypercholesterolemia, unspecified: Secondary | ICD-10-CM

## 2021-10-17 NOTE — Telephone Encounter (Signed)
Patients spouse came into the office stating that she needed medication refilled for patient. She stated that the pharmacy  sent over the script. The medications were Atorvastaian and benazepril. Please advise.

## 2021-10-18 NOTE — Telephone Encounter (Signed)
Pt last seen 09/22/21 with Junie Panning Mecum PA for back pain. Filled date 06/21/21 #100 with 0 RF please advise.

## 2021-10-18 NOTE — Telephone Encounter (Signed)
Requested medication (s) are due for refill today - yes  Requested medication (s) are on the active medication list -yes  Future visit scheduled -yes  Last refill: 4/52/23 #100  Notes to clinic: Dr Neomia Dear patient  Requested Prescriptions  Pending Prescriptions Disp Refills   atorvastatin (LIPITOR) 20 MG tablet [Pharmacy Med Name: ATORVASTATIN 20 MG TABLET] 100 tablet 0    Sig: Take 1 tablet (20 mg total) by mouth daily.     Cardiovascular:  Antilipid - Statins Failed - 10/17/2021  9:33 AM      Failed - Lipid Panel in normal range within the last 12 months    Cholesterol, Total  Date Value Ref Range Status  04/22/2021 147 100 - 199 mg/dL Final   Cholesterol [place], [place]  Date Value Ref Range Status  03/19/2019 150 <200 mg/dL Final    Comment:                            Desirable                <200                         Borderline High      200- 239                         High                     >239    LDL Chol Calc (NIH)  Date Value Ref Range Status  04/22/2021 72 0 - 99 mg/dL Final   HDL  Date Value Ref Range Status  04/22/2021 44 >39 mg/dL Final   Triglycerides  Date Value Ref Range Status  04/22/2021 182 (H) 0 - 149 mg/dL Final   Triglycerides Piccolo,Waived  Date Value Ref Range Status  03/19/2019 224 (H) <150 mg/dL Final    Comment:                            Normal                   <150                         Borderline High     150 - 199                         High                200 - 499                         Very High                >499          Passed - Patient is not pregnant      Passed - Valid encounter within last 12 months    Recent Outpatient Visits           3 weeks ago Type 2 diabetes mellitus with hyperglycemia, with long-term current use of insulin (HCC)   [facility] Mecum, Erin E, PA-C   5 months ago Diabetes mellitus without complication ([place])   [facility] [name], Avanti, MD  6 months ago  Localized swelling of both lower extremities   [place] [name], Avanti, MD   7 months ago SVT (supraventricular tachycardia) ([place])   [facility] [name], Avanti, MD   8 months ago Type 2 diabetes mellitus with hyperglycemia, with long-term current use of insulin ([place])   [place], Avanti, MD       Future Appointments             In 2 months Mecum, Erin E, PA-C [facility], [facility]               Requested Prescriptions  Pending Prescriptions Disp Refills   atorvastatin (LIPITOR) 20 MG tablet [Pharmacy Med Name: ATORVASTATIN 20 MG TABLET] 100 tablet 0    Sig: Take 1 tablet (20 mg total) by mouth daily.     Cardiovascular:  Antilipid - Statins Failed - 10/17/2021  9:33 AM      Failed - Lipid Panel in normal range within the last 12 months    Cholesterol, Total  Date Value Ref Range Status  04/22/2021 147 100 - 199 mg/dL Final   Cholesterol Piccolo, Waived  Date Value Ref Range Status  03/19/2019 150 <200 mg/dL Final    Comment:                            Desirable                <200                         Borderline High      200- 239                         High                     >239    LDL Chol Calc (NIH)  Date Value Ref Range Status  04/22/2021 72 0 - 99 mg/dL Final   HDL  Date Value Ref Range Status  04/22/2021 44 >39 mg/dL Final   Triglycerides  Date Value Ref Range Status  04/22/2021 182 (H) 0 - 149 mg/dL Final   Triglycerides Piccolo,Waived  Date Value Ref Range Status  03/19/2019 224 (H) <150 mg/dL Final    Comment:                            Normal                   <150                         Borderline High     150 - 199                         High                200 - 499                         Very High                >499          Passed - Patient is not pregnant      Passed -  Valid encounter within last 12 months    Recent Outpatient Visits           3 weeks ago Type 2  diabetes mellitus with hyperglycemia, with long-term current use of insulin ([place])   [facility] Mecum, Erin E, PA-C   5 months ago Diabetes mellitus without complication ([place])   [facility] [name], Avanti, MD   6 months ago Localized swelling of both lower extremities   [place] [name], Avanti, MD   7 months ago SVT (supraventricular tachycardia) ([place])   [facility] [name], Avanti, MD   8 months ago Type 2 diabetes mellitus with hyperglycemia, with long-term current use of insulin ([place])   [place], MD       Future Appointments             In 2 months Mecum, Dani Gobble, PA-C [employer], [facility]

## 2021-10-18 NOTE — Telephone Encounter (Signed)
Upcoming appointment in October with Junie Panning Mecum PA

## 2021-10-19 MED ORDER — BENAZEPRIL HCL 40 MG PO TABS: 40.0000 mg | ORAL_TABLET | Freq: Every day | ORAL | 0 refills | Status: DC

## 2021-10-19 NOTE — Telephone Encounter (Signed)
LOV 09/22/21  Future appt 12/23/21  Atorvastatin was already filled today

## 2021-10-26 NOTE — Telephone Encounter (Addendum)
Asked PCP for AT-LANTUS protocol. Will call weekly to get sugars under control.

## 2021-10-27 NOTE — Telephone Encounter (Signed)
Called and spoke with wife. She will have him call me back soon

## 2021-10-27 NOTE — Telephone Encounter (Signed)
Spoke with patient. Will increase insulin 2 units every 2 days until at goal of 150 units. Myriam will call next week to get readings  Jardiance/Levemir PAP start  Will schedule appt with me for next opening

## 2021-10-28 ENCOUNTER — Telehealth: Payer: Self-pay

## 2021-10-28 NOTE — Telephone Encounter (Signed)
Pt wife is calling back requesting an update on samples of the Jardiance. Stated pt is out of medication.   Wife is requesting a call back today.  Please advise.

## 2021-10-28 NOTE — Telephone Encounter (Unsigned)
Copied from [place] [id number]. Topic: General - Other >> Oct 28, 2021  3:56 PM Everette C wrote: Reason for CRM: The patient's wife would like for Alric Seton to be notified that there are no samples of Jardiance available at the practice  The patient's husband was directed to contact the practice and request the samples by Alric Seton a member of the [facility] Pharm team   Please contact the patient's wife further if needed

## 2021-10-28 NOTE — Telephone Encounter (Signed)
Wife is wanting samples of the jardiance if you have any.

## 2021-10-28 NOTE — Telephone Encounter (Signed)
Copied from [place] [id number]. Topic: General - Other >> Oct 28, 2021  8:58 AM Everette C wrote: Reason for CRM: The patient's wife has made contact requesting to speak with Coy Saunas regarding the patient's prescription for empagliflozin (JARDIANCE) 25 MG TABS tablet [[id number]]  The patient had a visit with [place] yesterday and has additional questions about their medication  Please contact further when possible

## 2021-10-28 NOTE — Telephone Encounter (Signed)
See other phone encounter.  

## 2021-11-08 ENCOUNTER — Telehealth: Payer: Self-pay

## 2021-11-08 NOTE — Chronic Care Management (AMB) (Signed)
    Chronic Care Management Pharmacy Assistant   Name: JACOBI NILE  MRN: [id number] DOB: [birth year]   Reason for Encounter: Patient assistance applications for Jardiance and Levemir    Medications: Outpatient Encounter Medications as of 11/08/2021  Medication Sig   aspirin EC 81 MG tablet Take 81 mg by mouth daily. Swallow whole.   atorvastatin (LIPITOR) 20 MG tablet Take 1 tablet (20 mg total) by mouth daily.   benazepril (LOTENSIN) 40 MG tablet Take 1 tablet (40 mg total) by mouth daily.   empagliflozin (JARDIANCE) 25 MG TABS tablet Take 1 tablet (25 mg total) by mouth daily before breakfast.   insulin detemir (LEVEMIR FLEXTOUCH) 100 UNIT/ML FlexPen Inject 35 units into the skin at bedtime.   Insulin Pen Needle 31G X 8 MM MISC Use one needle to administer insulin into skin nightly.   lidocaine (LIDODERM) 5 % Place 1 patch onto the skin daily. Remove & Discard patch within 12 hours or as directed by MD   metFORMIN (GLUCOPHAGE) 1000 MG tablet Take 1 tablet (1,000 mg total) by mouth 2 (two) times daily with a meal.   metoprolol tartrate (LOPRESSOR) 25 MG tablet Take 0.5 tablets (12.5 mg total) by mouth daily.   Multiple Vitamins-Minerals (CENTRUM SILVER 50+MEN) TABS Take 1 tablet by mouth daily.   Semaglutide (RYBELSUS) 3 MG TABS Take 3 mg by mouth daily. (Patient not taking: Reported on 10/26/2021)   vitamin B-12 (CYANOCOBALAMIN) 500 MCG tablet Take 500 mcg by mouth daily.   No facility-administered encounter medications on file as of 11/08/2021.   I have prefilled and mailed out patient assistance application on Jardiance and Levemir. I mentioned to the patient that he will need to complete all highlighted areas of the forms and attach any financial statements. Patient understood and will complete the forms and bring them back to the office when he is done.  Blood sugar readings: 155-11/08/21 42 units 145-11/02/21  143-11/03/21 178-11/04/21 units 178-11/05/21 42 units  Myriam  Elta [place], [place]

## 2021-11-11 NOTE — Telephone Encounter (Signed)
Patient at 150's average of sugars. Spoke with wife. She is unsure what dose he is using. Will have them call me back with units. Told them to hold whatever units they are doing

## 2021-11-21 NOTE — Telephone Encounter (Signed)
Patient's sugars looking great! 42 units every day. Will hold until f/u with PCP next month

## 2021-12-14 DIAGNOSIS — Z79899 Other long term (current) drug therapy: Secondary | ICD-10-CM | POA: Diagnosis not present

## 2021-12-14 DIAGNOSIS — Z743 Need for continuous supervision: Secondary | ICD-10-CM | POA: Diagnosis not present

## 2021-12-14 DIAGNOSIS — I129 Hypertensive chronic kidney disease with stage 1 through stage 4 chronic kidney disease, or unspecified chronic kidney disease: Secondary | ICD-10-CM | POA: Diagnosis not present

## 2021-12-14 DIAGNOSIS — S2241XD Multiple fractures of ribs, right side, subsequent encounter for fracture with routine healing: Secondary | ICD-10-CM | POA: Diagnosis not present

## 2021-12-14 DIAGNOSIS — S29009A Unspecified injury of muscle and tendon of unspecified wall of thorax, initial encounter: Secondary | ICD-10-CM | POA: Diagnosis not present

## 2021-12-14 DIAGNOSIS — S199XXA Unspecified injury of neck, initial encounter: Secondary | ICD-10-CM | POA: Diagnosis not present

## 2021-12-14 DIAGNOSIS — E119 Type 2 diabetes mellitus without complications: Secondary | ICD-10-CM | POA: Diagnosis not present

## 2021-12-14 DIAGNOSIS — Z7982 Long term (current) use of aspirin: Secondary | ICD-10-CM | POA: Diagnosis not present

## 2021-12-14 DIAGNOSIS — S0990XA Unspecified injury of head, initial encounter: Secondary | ICD-10-CM | POA: Diagnosis not present

## 2021-12-14 DIAGNOSIS — Z7984 Long term (current) use of oral hypoglycemic drugs: Secondary | ICD-10-CM | POA: Diagnosis not present

## 2021-12-14 DIAGNOSIS — S2241XA Multiple fractures of ribs, right side, initial encounter for closed fracture: Secondary | ICD-10-CM | POA: Diagnosis not present

## 2021-12-14 DIAGNOSIS — Z888 Allergy status to other drugs, medicaments and biological substances status: Secondary | ICD-10-CM | POA: Diagnosis not present

## 2021-12-14 DIAGNOSIS — N183 Chronic kidney disease, stage 3 unspecified: Secondary | ICD-10-CM | POA: Diagnosis not present

## 2021-12-14 DIAGNOSIS — R0789 Other chest pain: Secondary | ICD-10-CM | POA: Diagnosis not present

## 2021-12-14 DIAGNOSIS — E785 Hyperlipidemia, unspecified: Secondary | ICD-10-CM | POA: Diagnosis not present

## 2021-12-14 DIAGNOSIS — S299XXA Unspecified injury of thorax, initial encounter: Secondary | ICD-10-CM | POA: Diagnosis not present

## 2021-12-14 DIAGNOSIS — R739 Hyperglycemia, unspecified: Secondary | ICD-10-CM | POA: Diagnosis not present

## 2021-12-14 DIAGNOSIS — E1165 Type 2 diabetes mellitus with hyperglycemia: Secondary | ICD-10-CM | POA: Diagnosis not present

## 2021-12-14 DIAGNOSIS — M549 Dorsalgia, unspecified: Secondary | ICD-10-CM | POA: Diagnosis not present

## 2021-12-14 DIAGNOSIS — Z136 Encounter for screening for cardiovascular disorders: Secondary | ICD-10-CM | POA: Diagnosis not present

## 2021-12-14 DIAGNOSIS — R0902 Hypoxemia: Secondary | ICD-10-CM | POA: Diagnosis not present

## 2021-12-14 DIAGNOSIS — E1122 Type 2 diabetes mellitus with diabetic chronic kidney disease: Secondary | ICD-10-CM | POA: Diagnosis not present

## 2021-12-14 DIAGNOSIS — M4302 Spondylolysis, cervical region: Secondary | ICD-10-CM | POA: Diagnosis not present

## 2021-12-14 DIAGNOSIS — S3993XA Unspecified injury of pelvis, initial encounter: Secondary | ICD-10-CM | POA: Diagnosis not present

## 2021-12-14 DIAGNOSIS — S3991XA Unspecified injury of abdomen, initial encounter: Secondary | ICD-10-CM | POA: Diagnosis not present

## 2021-12-14 DIAGNOSIS — Z4682 Encounter for fitting and adjustment of non-vascular catheter: Secondary | ICD-10-CM | POA: Diagnosis not present

## 2021-12-14 DIAGNOSIS — I10 Essential (primary) hypertension: Secondary | ICD-10-CM | POA: Diagnosis not present

## 2021-12-14 DIAGNOSIS — D62 Acute posthemorrhagic anemia: Secondary | ICD-10-CM | POA: Diagnosis not present

## 2021-12-14 DIAGNOSIS — S3992XA Unspecified injury of lower back, initial encounter: Secondary | ICD-10-CM | POA: Diagnosis not present

## 2021-12-15 DIAGNOSIS — Z4682 Encounter for fitting and adjustment of non-vascular catheter: Secondary | ICD-10-CM | POA: Diagnosis not present

## 2021-12-15 DIAGNOSIS — S29009A Unspecified injury of muscle and tendon of unspecified wall of thorax, initial encounter: Secondary | ICD-10-CM | POA: Diagnosis not present

## 2021-12-15 DIAGNOSIS — Z136 Encounter for screening for cardiovascular disorders: Secondary | ICD-10-CM | POA: Diagnosis not present

## 2021-12-15 DIAGNOSIS — S2241XA Multiple fractures of ribs, right side, initial encounter for closed fracture: Secondary | ICD-10-CM | POA: Diagnosis not present

## 2021-12-15 DIAGNOSIS — E119 Type 2 diabetes mellitus without complications: Secondary | ICD-10-CM | POA: Diagnosis not present

## 2021-12-16 DIAGNOSIS — S2241XA Multiple fractures of ribs, right side, initial encounter for closed fracture: Secondary | ICD-10-CM | POA: Diagnosis not present

## 2021-12-20 ENCOUNTER — Other Ambulatory Visit: Payer: Self-pay | Admitting: Nurse Practitioner

## 2021-12-20 DIAGNOSIS — E119 Type 2 diabetes mellitus without complications: Secondary | ICD-10-CM

## 2021-12-20 MED ORDER — METFORMIN HCL 1000 MG PO TABS: 1000.0000 mg | ORAL_TABLET | Freq: Two times a day (BID) | ORAL | 0 refills | Status: DC

## 2021-12-20 NOTE — Telephone Encounter (Signed)
PT's wife came in stating he has been without his Metformin for 24 hours.  She went to the pharmacy they stated that they sent a request in for them to refill it.  I told her that I could send a request to see if it could be refilled.  I did see that he already has appointment set for 12/23/2021 at 2:40pm.  Told her she should receive a phone call letting her know what was decided.

## 2021-12-20 NOTE — Telephone Encounter (Signed)
Pt's wife called saying he really needs this medication approved and sent to the pharmacy today.

## 2021-12-21 ENCOUNTER — Telehealth: Payer: Self-pay | Admitting: *Deleted

## 2021-12-21 NOTE — Telephone Encounter (Signed)
Refused metformin 1000 mg refill request because it's a duplicate.

## 2021-12-21 NOTE — Patient Outreach (Signed)
  Care Coordination [facility] Note Transition Care Management Follow-up Telephone Call Date of discharge and from where: [facility] [phone] How have you been since you were released from the hospital? My back has been sore Any questions or concerns? Y My back has been sore is it ok for me to use a heating pad.  Rn discussed with patient about wound care for the abrasions  Items Reviewed: Did the pt receive and understand the discharge instructions provided? Yes  Medications obtained and verified? Yes  Other? No  Any new allergies since your discharge? No  Dietary orders reviewed? No Do you have support at home? Yes   Home Care and Equipment/Supplies: Were home health services ordered? no If so, what is the name of the agency? N/a  Has the agency set up a time to come to the patient's home? not applicable Were any new equipment or medical supplies ordered?  No What is the name of the medical supply agency? N/a Were you able to get the supplies/equipment? not applicable Do you have any questions related to the use of the equipment or supplies? No  Functional Questionnaire: (I = Independent and D = Dependent) ADLs: D  Bathing/Dressing- D  Meal Prep- D  Eating- I  Maintaining continence- I  Transferring/Ambulation- I  Managing Meds- I  Follow up appointments reviewed:  PCP Hospital f/u appt confirmed? No   Specialist Hospital f/u appt confirmed? Yes  surgeon on 01/06/2022 8:30. Are transportation arrangements needed? No  If their condition worsens, is the pt aware to call PCP or go to the Emergency Dept.? Y  Was the patient provided with contact information for the PCP's office or ED? Yes Was to pt encouraged to call back with questions or concerns? Yes  SDOH assessments and interventions completed:   Yes  Care Coordination Interventions Activated:  Yes   Care Coordination Interventions:  No Care Coordination interventions needed at this time.   Encounter Outcome:  Pt. Visit  Completed    [place] Management [phone]

## 2021-12-21 NOTE — Patient Outreach (Signed)
  Care Coordination [facility] Note Transition Care Management Unsuccessful Follow-up Telephone Call  Date of discharge and from where:  [facility] [phone]  Attempts:  1st Attempt  Reason for unsuccessful TCM follow-up call:  Unable to reach patient Wife asked for call back within 5 minutes  [place] Management [phone]

## 2021-12-23 ENCOUNTER — Ambulatory Visit: Payer: Medicare HMO | Admitting: Physician Assistant

## 2021-12-25 DIAGNOSIS — E538 Deficiency of other specified B group vitamins: Secondary | ICD-10-CM | POA: Insufficient documentation

## 2021-12-25 NOTE — Patient Instructions (Signed)
Diabetes Mellitus Basics  Diabetes mellitus, or diabetes, is a long-term (chronic) disease. It occurs when the body does not properly use sugar (glucose) that is released from food after you eat. Diabetes mellitus may be caused by one or both of these problems: Your pancreas does not make enough of a hormone called insulin. Your body does not react in a normal way to the insulin that it makes. Insulin lets glucose enter cells in your body. This gives you energy. If you have diabetes, glucose cannot get into cells. This causes high blood glucose (hyperglycemia). How to treat and manage diabetes You may need to take insulin or other diabetes medicines daily to keep your glucose in balance. If you are prescribed insulin, you will learn how to give yourself insulin by injection. You may need to adjust the amount of insulin you take based on the foods that you eat. You will need to check your blood glucose levels using a glucose monitor as told by your health care provider. The readings can help determine if you have low or high blood glucose. Generally, you should have these blood glucose levels: Before meals (preprandial): 80-130 mg/dL (4.4-7.2 mmol/L). After meals (postprandial): below 180 mg/dL (10 mmol/L). Hemoglobin A1c (HbA1c) level: less than 7%. Your health care provider will set treatment goals for you. Keep all follow-up visits. This is important. Follow these instructions at home: Diabetes medicines Take your diabetes medicines every day as told by your health care provider. List your diabetes medicines here: Name of medicine: ______________________________ Amount (dose): _______________ Time (a.m./p.m.): _______________ Notes: ___________________________________ Name of medicine: ______________________________ Amount (dose): _______________ Time (a.m./p.m.): _______________ Notes: ___________________________________ Name of medicine: ______________________________ Amount (dose):  _______________ Time (a.m./p.m.): _______________ Notes: ___________________________________ Insulin If you use insulin, list the types of insulin you use here: Insulin type: ______________________________ Amount (dose): _______________ Time (a.m./p.m.): _______________Notes: ___________________________________ Insulin type: ______________________________ Amount (dose): _______________ Time (a.m./p.m.): _______________ Notes: ___________________________________ Insulin type: ______________________________ Amount (dose): _______________ Time (a.m./p.m.): _______________ Notes: ___________________________________ Insulin type: ______________________________ Amount (dose): _______________ Time (a.m./p.m.): _______________ Notes: ___________________________________ Insulin type: ______________________________ Amount (dose): _______________ Time (a.m./p.m.): _______________ Notes: ___________________________________ Managing blood glucose  Check your blood glucose levels using a glucose monitor as told by your health care provider. Write down the times that you check your glucose levels here: Time: _______________ Notes: ___________________________________ Time: _______________ Notes: ___________________________________ Time: _______________ Notes: ___________________________________ Time: _______________ Notes: ___________________________________ Time: _______________ Notes: ___________________________________ Time: _______________ Notes: ___________________________________  Low blood glucose Low blood glucose (hypoglycemia) is when glucose is at or below 70 mg/dL (3.9 mmol/L). Symptoms may include: Feeling: Hungry. Sweaty and clammy. Irritable or easily upset. Dizzy. Sleepy. Having: A fast heartbeat. A headache. A change in your vision. Numbness around the mouth, lips, or tongue. Having trouble with: Moving (coordination). Sleeping. Treating low blood glucose To treat low blood  glucose, eat or drink something containing sugar right away. If you can think clearly and swallow safely, follow the 15:15 rule: Take 15 grams of a fast-acting carb (carbohydrate), as told by your health care provider. Some fast-acting carbs are: Glucose tablets: take 3-4 tablets. Hard candy: eat 3-5 pieces. Fruit juice: drink 4 oz (120 mL). Regular (not diet) soda: drink 4-6 oz (120-180 mL). Honey or sugar: eat 1 Tbsp (15 mL). Check your blood glucose levels 15 minutes after you take the carb. If your glucose is still at or below 70 mg/dL (3.9 mmol/L), take 15 grams of a carb again. If your glucose does not go above 70 mg/dL (3.9 mmol/L) after   3 tries, get help right away. After your glucose goes back to normal, eat a meal or a snack within 1 hour. Treating very low blood glucose If your glucose is at or below 54 mg/dL (3 mmol/L), you have very low blood glucose (severe hypoglycemia). This is an emergency. Do not wait to see if the symptoms will go away. Get medical help right away. Call your local emergency services (911 in the [place] Do not drive yourself to the hospital. Questions to ask your health care provider Should I talk with a diabetes educator? What equipment will I need to care for myself at home? What diabetes medicines do I need? When should I take them? How often do I need to check my blood glucose levels? What number can I call if I have questions? When is my follow-up visit? Where can I find a support group for people with diabetes? Where to find more information American Diabetes Association: www.diabetes.org Association of Diabetes Care and Education Specialists: www.diabeteseducator.org Contact a health care provider if: Your blood glucose is at or above 240 mg/dL (13.3 mmol/L) for 2 days in a row. You have been sick or have had a fever for 2 days or more, and you are not getting better. You have any of these problems for more than 6 hours: You cannot eat or  drink. You feel nauseous. You vomit. You have diarrhea. Get help right away if: Your blood glucose is lower than 54 mg/dL (3 mmol/L). You get confused. You have trouble thinking clearly. You have trouble breathing. These symptoms may represent a serious problem that is an emergency. Do not wait to see if the symptoms will go away. Get medical help right away. Call your local emergency services (911 in the [place] Do not drive yourself to the hospital. Summary Diabetes mellitus is a chronic disease that occurs when the body does not properly use sugar (glucose) that is released from food after you eat. Take insulin and diabetes medicines as told. Check your blood glucose every day, as often as told. Keep all follow-up visits. This is important. This information is not intended to replace advice given to you by your health care provider. Make sure you discuss any questions you have with your health care provider. Document Revised: 06/17/2019 Document Reviewed: 06/17/2019 Elsevier Patient Education  2023 Elsevier Inc.  

## 2021-12-26 ENCOUNTER — Ambulatory Visit (INDEPENDENT_AMBULATORY_CARE_PROVIDER_SITE_OTHER): Payer: Medicare HMO | Admitting: Nurse Practitioner

## 2021-12-26 ENCOUNTER — Encounter: Payer: Self-pay | Admitting: Nurse Practitioner

## 2021-12-26 VITALS — BP 100/63 | HR 87 | Temp 98.0°F | Ht 69.69 in | Wt 234.8 lb

## 2021-12-26 DIAGNOSIS — I152 Hypertension secondary to endocrine disorders: Secondary | ICD-10-CM

## 2021-12-26 DIAGNOSIS — E538 Deficiency of other specified B group vitamins: Secondary | ICD-10-CM

## 2021-12-26 DIAGNOSIS — E669 Obesity, unspecified: Secondary | ICD-10-CM | POA: Insufficient documentation

## 2021-12-26 DIAGNOSIS — E1159 Type 2 diabetes mellitus with other circulatory complications: Secondary | ICD-10-CM | POA: Diagnosis not present

## 2021-12-26 DIAGNOSIS — E785 Hyperlipidemia, unspecified: Secondary | ICD-10-CM

## 2021-12-26 DIAGNOSIS — E1122 Type 2 diabetes mellitus with diabetic chronic kidney disease: Secondary | ICD-10-CM | POA: Diagnosis not present

## 2021-12-26 DIAGNOSIS — I471 Supraventricular tachycardia, unspecified: Secondary | ICD-10-CM | POA: Diagnosis not present

## 2021-12-26 DIAGNOSIS — E6609 Other obesity due to excess calories: Secondary | ICD-10-CM

## 2021-12-26 DIAGNOSIS — E1165 Type 2 diabetes mellitus with hyperglycemia: Secondary | ICD-10-CM

## 2021-12-26 DIAGNOSIS — N1831 Chronic kidney disease, stage 3a: Secondary | ICD-10-CM

## 2021-12-26 DIAGNOSIS — Z794 Long term (current) use of insulin: Secondary | ICD-10-CM

## 2021-12-26 DIAGNOSIS — Z6833 Body mass index (BMI) 33.0-33.9, adult: Secondary | ICD-10-CM

## 2021-12-26 DIAGNOSIS — D3501 Benign neoplasm of right adrenal gland: Secondary | ICD-10-CM | POA: Diagnosis not present

## 2021-12-26 DIAGNOSIS — N183 Chronic kidney disease, stage 3 unspecified: Secondary | ICD-10-CM

## 2021-12-26 DIAGNOSIS — D179 Benign lipomatous neoplasm, unspecified: Secondary | ICD-10-CM | POA: Insufficient documentation

## 2021-12-26 DIAGNOSIS — E1169 Type 2 diabetes mellitus with other specified complication: Secondary | ICD-10-CM

## 2021-12-26 DIAGNOSIS — K8020 Calculus of gallbladder without cholecystitis without obstruction: Secondary | ICD-10-CM

## 2021-12-26 LAB — BAYER DCA HB A1C WAIVED: HB A1C (BAYER DCA - WAIVED): 9.2 % — ABNORMAL HIGH (ref 4.8–5.6)

## 2021-12-26 NOTE — Assessment & Plan Note (Signed)
Chronic, ongoing.  Remaining stable recent labs, will need to reduce Metformin to 500 MG BID if eGFR consistently below 45 -- currently above this.  Consider nephrology if worsening symptoms.

## 2021-12-26 NOTE — Assessment & Plan Note (Signed)
Chronic, ongoing.  Recommend continue supplement daily.  Recheck level today.

## 2021-12-26 NOTE — Assessment & Plan Note (Signed)
Chronic, ongoing.  CKD 3a recent labs, remaining stable.  Recheck CMP today and refer to nephrology as needed for worsening.

## 2021-12-26 NOTE — Assessment & Plan Note (Signed)
Noted on [facility] imagining, asymptomatic.  Monitor and send to general surgery if symptoms present.

## 2021-12-26 NOTE — Assessment & Plan Note (Signed)
BMI 33.99.  Recommended eating smaller high protein, low fat meals more frequently and exercising 30 mins a day 5 times a week with a goal of 10-15lb weight loss in the next 3 months. Patient voiced their understanding and motivation to adhere to these recommendations.

## 2021-12-26 NOTE — Assessment & Plan Note (Signed)
Chronic, ongoing.  Continue current medication regimen and adjust as needed. Lipid panel today. 

## 2021-12-26 NOTE — Assessment & Plan Note (Signed)
Noted on [facility] imaging, educated at length on this and will monitor.

## 2021-12-26 NOTE — Assessment & Plan Note (Signed)
Acute and gradually improving, suspect current edema BLE from position and Gabapentin.  Recommend elevation of legs while sitting up and wearing compression hose on during day and off at night.  Discussed edema and Gabapentin use + position.  At this time continue current medication regimen as prescribed by ortho trauma [facility] and collaboration with them.  Return in 6 weeks.
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
## 2021-12-26 NOTE — Assessment & Plan Note (Signed)
Chronic, ongoing.  BP well at goal today.  Recommend she monitor BP at least a few mornings a week at home and document.  DASH diet at home.  Continue current medication regimen and adjust as needed.  Labs today: CBC, CMP, TSH.  Benazepril for kidney protection in diabetes.

## 2021-12-27 LAB — COMPREHENSIVE METABOLIC PANEL
ALT: 18 IU/L (ref 0–44)
AST: 16 IU/L (ref 0–40)
Albumin/Globulin Ratio: 1.6 (ref 1.2–2.2)
Albumin: 4.2 g/dL (ref 3.8–4.8)
Alkaline Phosphatase: 121 IU/L (ref 44–121)
BUN/Creatinine Ratio: 18 (ref 10–24)
BUN: 26 mg/dL (ref 8–27)
Bilirubin Total: 0.2 mg/dL (ref 0.0–1.2)
CO2: 20 mmol/L (ref 20–29)
Calcium: 9.2 mg/dL (ref 8.6–10.2)
Chloride: 99 mmol/L (ref 96–106)
Creatinine, Ser: 1.42 mg/dL — ABNORMAL HIGH (ref 0.76–1.27)
Globulin, Total: 2.6 g/dL (ref 1.5–4.5)
Glucose: 146 mg/dL — ABNORMAL HIGH (ref 70–99)
Potassium: 4.6 mmol/L (ref 3.5–5.2)
Sodium: 137 mmol/L (ref 134–144)
Total Protein: 6.8 g/dL (ref 6.0–8.5)
eGFR: 52 mL/min/{1.73_m2} — ABNORMAL LOW (ref >59)

## 2021-12-27 LAB — CBC WITH DIFFERENTIAL/PLATELET
Basophils Absolute: 0.1 10*3/uL (ref 0.0–0.2)
Basos: 1 %
EOS (ABSOLUTE): 0.2 10*3/uL (ref 0.0–0.4)
Eos: 3 %
Hematocrit: 40.9 % (ref 37.5–51.0)
Hemoglobin: 14.3 g/dL (ref 13.0–17.7)
Immature Grans (Abs): 0 10*3/uL (ref 0.0–0.1)
Immature Granulocytes: 0 %
Lymphocytes Absolute: 1.7 10*3/uL (ref 0.7–3.1)
Lymphs: 21 %
MCH: 29.7 pg (ref 26.6–33.0)
MCHC: 35 g/dL (ref 31.5–35.7)
MCV: 85 fL (ref 79–97)
Monocytes Absolute: 0.7 10*3/uL (ref 0.1–0.9)
Monocytes: 9 %
Neutrophils Absolute: 5.2 10*3/uL (ref 1.4–7.0)
Neutrophils: 66 %
Platelets: 302 10*3/uL (ref 150–450)
RBC: 4.82 x10E6/uL (ref 4.14–5.80)
RDW: 12.4 % (ref 11.6–15.4)
WBC: 7.8 10*3/uL (ref 3.4–10.8)

## 2021-12-27 LAB — LIPID PANEL W/O CHOL/HDL RATIO
Cholesterol, Total: 144 mg/dL (ref 100–199)
HDL: 52 mg/dL (ref >39)
LDL Chol Calc (NIH): 76 mg/dL (ref 0–99)
Triglycerides: 86 mg/dL (ref 0–149)
VLDL Cholesterol Cal: 16 mg/dL (ref 5–40)

## 2021-12-27 LAB — VITAMIN B12: Vitamin B-12: 458 pg/mL (ref 232–1245)

## 2021-12-27 LAB — TSH: TSH: 2.55 u[IU]/mL (ref 0.450–4.500)

## 2021-12-27 NOTE — Progress Notes (Signed)
Good morning please let Matthew [name] and his wife know labs have returned: - Kidney function continues to show stage 3a kidney disease with no worsening, we will continue to monitor this closely and reduce any medication doses at needed.  Avoid Ibuprofen products and ensure plenty of water intake daily. - Liver function is normal. - CBC shows no anemia or infection and cholesterol levels are stable -- continue Atorvastatin. - A1c is 9.2%, trending down, but still above goal.  Increase Levemir to 48 units and if in 3 days his fasting blood sugars in morning are still above 130 then increase 3 more units to 51 units.  Any questions on this? Keep being amazing!!  Thank you for allowing me to participate in your care.  I appreciate you. Kindest regards, Loveah Like

## 2022-01-03 ENCOUNTER — Ambulatory Visit: Payer: Self-pay | Admitting: Podiatry

## 2022-01-05 ENCOUNTER — Ambulatory Visit: Payer: Medicare HMO | Admitting: Nurse Practitioner

## 2022-01-06 DIAGNOSIS — X58XXXD Exposure to other specified factors, subsequent encounter: Secondary | ICD-10-CM | POA: Diagnosis not present

## 2022-01-06 DIAGNOSIS — S0003XD Contusion of scalp, subsequent encounter: Secondary | ICD-10-CM | POA: Diagnosis not present

## 2022-01-06 DIAGNOSIS — Z760 Encounter for issue of repeat prescription: Secondary | ICD-10-CM | POA: Diagnosis not present

## 2022-01-06 DIAGNOSIS — M19011 Primary osteoarthritis, right shoulder: Secondary | ICD-10-CM | POA: Diagnosis not present

## 2022-01-06 DIAGNOSIS — M25511 Pain in right shoulder: Secondary | ICD-10-CM | POA: Diagnosis not present

## 2022-01-06 DIAGNOSIS — T148XXD Other injury of unspecified body region, subsequent encounter: Secondary | ICD-10-CM | POA: Diagnosis not present

## 2022-01-06 DIAGNOSIS — M25519 Pain in unspecified shoulder: Secondary | ICD-10-CM | POA: Diagnosis not present

## 2022-01-06 DIAGNOSIS — N189 Chronic kidney disease, unspecified: Secondary | ICD-10-CM | POA: Diagnosis not present

## 2022-01-06 DIAGNOSIS — S2241XD Multiple fractures of ribs, right side, subsequent encounter for fracture with routine healing: Secondary | ICD-10-CM | POA: Diagnosis not present

## 2022-01-10 ENCOUNTER — Ambulatory Visit: Payer: Medicare HMO | Admitting: Podiatry

## 2022-01-11 ENCOUNTER — Encounter: Payer: Self-pay | Admitting: Nurse Practitioner

## 2022-01-11 ENCOUNTER — Ambulatory Visit (INDEPENDENT_AMBULATORY_CARE_PROVIDER_SITE_OTHER): Payer: Medicare HMO | Admitting: Nurse Practitioner

## 2022-01-11 VITALS — BP 124/61 | HR 98 | Temp 97.8°F | Wt 237.4 lb

## 2022-01-11 DIAGNOSIS — S61402A Unspecified open wound of left hand, initial encounter: Secondary | ICD-10-CM | POA: Insufficient documentation

## 2022-01-11 DIAGNOSIS — Z794 Long term (current) use of insulin: Secondary | ICD-10-CM | POA: Diagnosis not present

## 2022-01-11 DIAGNOSIS — E1165 Type 2 diabetes mellitus with hyperglycemia: Secondary | ICD-10-CM | POA: Diagnosis not present

## 2022-01-11 DIAGNOSIS — S61412D Laceration without foreign body of left hand, subsequent encounter: Secondary | ICD-10-CM | POA: Diagnosis not present

## 2022-01-11 MED ORDER — LEVEMIR FLEXTOUCH 100 UNIT/ML ~~LOC~~ SOPN: PEN_INJECTOR | SUBCUTANEOUS | 6 refills | Status: DC

## 2022-01-11 MED ORDER — DOXYCYCLINE HYCLATE 100 MG PO TABS: 100.0000 mg | ORAL_TABLET | Freq: Two times a day (BID) | ORAL | 0 refills | Status: AC

## 2022-01-11 MED ORDER — MUPIROCIN 2 % EX OINT: 1.0000 | TOPICAL_OINTMENT | Freq: Two times a day (BID) | CUTANEOUS | 4 refills | Status: AC

## 2022-01-11 NOTE — Patient Instructions (Signed)
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
## 2022-01-17 ENCOUNTER — Other Ambulatory Visit: Payer: Self-pay | Admitting: Nurse Practitioner

## 2022-01-17 DIAGNOSIS — I10 Essential (primary) hypertension: Secondary | ICD-10-CM

## 2022-01-17 NOTE — Telephone Encounter (Signed)
Requested Prescriptions  Pending Prescriptions Disp Refills   benazepril (LOTENSIN) 40 MG tablet [Pharmacy Med Name: BENAZEPRIL HCL 40 MG TABLET] 90 tablet 1    Sig: Take 1 tablet (40 mg total) by mouth daily.     Cardiovascular:  ACE Inhibitors Failed - 01/17/2022  9:54 AM      Failed - Cr in normal range and within 180 days    Creatinine, Ser  Date Value Ref Range Status  12/26/2021 1.42 (H) 0.76 - 1.27 mg/dL Final         Passed - K in normal range and within 180 days    Potassium  Date Value Ref Range Status  12/26/2021 4.6 3.5 - 5.2 mmol/L Final         Passed - Patient is not pregnant      Passed - Last BP in normal range    BP Readings from Last 1 Encounters:  01/11/22 124/61         Passed - Valid encounter within last 6 months    Recent Outpatient Visits           6 days ago Type 2 diabetes mellitus with hyperglycemia, with long-term current use of insulin ([place])   [facility]infred, Jolene T, NP   3 weeks ago Type 2 diabetes mellitus with hyperglycemia, with long-term current use of insulin ([place])   [place], Jolene T, NP   3 months ago Type 2 diabetes mellitus with hyperglycemia, with long-term current use of insulin ([place])   [facility] Mecum, Erin E, PA-C   9 months ago Diabetes mellitus without complication ([place])   [facility] [name], Avanti, MD   9 months ago Localized swelling of both lower extremities   [facility] [name], Avanti, MD       Future Appointments             In 2 weeks Cannady, Barbaraann Faster, NP [employer]

## 2022-01-23 DIAGNOSIS — M19011 Primary osteoarthritis, right shoulder: Secondary | ICD-10-CM | POA: Diagnosis not present

## 2022-01-23 DIAGNOSIS — S46011A Strain of muscle(s) and tendon(s) of the rotator cuff of right shoulder, initial encounter: Secondary | ICD-10-CM | POA: Diagnosis not present

## 2022-01-30 ENCOUNTER — Telehealth: Payer: Self-pay | Admitting: Nurse Practitioner

## 2022-01-30 NOTE — Telephone Encounter (Signed)
Left message for patient to call back and schedule the Medicare Annual Wellness Visit (AWV) virtually or by telephone.  Last AWV 01/17/21  Please schedule at anytime with [facility] Health Advisor.  30 minute appointment  Any questions, please call me at [phone]

## 2022-02-02 DIAGNOSIS — R29898 Other symptoms and signs involving the musculoskeletal system: Secondary | ICD-10-CM | POA: Diagnosis not present

## 2022-02-02 DIAGNOSIS — M25611 Stiffness of right shoulder, not elsewhere classified: Secondary | ICD-10-CM | POA: Diagnosis not present

## 2022-02-02 DIAGNOSIS — M25511 Pain in right shoulder: Secondary | ICD-10-CM | POA: Diagnosis not present

## 2022-02-02 DIAGNOSIS — G8911 Acute pain due to trauma: Secondary | ICD-10-CM | POA: Diagnosis not present

## 2022-02-02 DIAGNOSIS — R293 Abnormal posture: Secondary | ICD-10-CM | POA: Diagnosis not present

## 2022-02-06 ENCOUNTER — Ambulatory Visit: Payer: Medicare HMO | Admitting: Nurse Practitioner

## 2022-02-06 ENCOUNTER — Ambulatory Visit (INDEPENDENT_AMBULATORY_CARE_PROVIDER_SITE_OTHER): Payer: Medicare HMO | Admitting: *Deleted

## 2022-02-06 DIAGNOSIS — Z0000 Encounter for general adult medical examination without abnormal findings: Secondary | ICD-10-CM

## 2022-02-06 NOTE — Patient Instructions (Signed)
[name] , Thank you for taking time to come for your Medicare Wellness Visit. I appreciate your ongoing commitment to your health goals. Please review the following plan we discussed and let me know if I can assist you in the future.   These are the goals we discussed:  Goals       DIET - INCREASE WATER INTAKE      Recommend drinking at least 6-8 glasses of water a day       [place] (see longitudinal plan of care for additional care plan information)  Current Barriers:  Chronic Disease Management support, education, and care coordination needs related to Hypertension, Hyperlipidemia, Diabetes, and BPH   Hypertension BP Readings from Last 3 Encounters:  09/19/19 126/69  06/16/19 132/87  06/16/19 132/87  Pharmacist Clinical Goal(s): Over the next 90 days, patient will work with PharmD and providers to maintain BP goal <130/80 Current regimen:  Benazepril 40 mg qd Interventions: Provided diet and exercise counseling. Reviewed fill history in computer Encouraged patient to check BP at home to ensure adequate control Patient self care activities - Over the next 90 days, patient will: Check BP weekly, document, and provide at future appointments Ensure daily salt intake < 2300 mg/day  Hyperlipidemia Lab Results  Component Value Date/Time   [facility] 75 09/19/2019 09:16 AM  Pharmacist Clinical Goal(s): Over the next 90 days, patient will work with PharmD and providers to maintain LDL goal < 100 Current regimen:  Atorvastatin 20 mg qd Interventions: Provided diet and exercise counseling. Patient self care activities - Over the next 90 days, patient will: Continue focus on DASH diet and incorporate structured exercise program with a goal of 30 min/day 5 days/week  Diabetes Lab Results  Component Value Date/Time   HGBA1C 7.5 (H) 09/19/2019 09:16 AM   HGBA1C 7.3 (H) 06/16/2019 09:22 AM   HGBA1C >14.0 (H) 03/19/2019 04:11 PM   HGBA1C 6.8  08/08/2017 10:25 AM  Pharmacist Clinical Goal(s): Over the next 90 days, patient will work with PharmD and providers to achieve A1c goal <7% Current regimen:  Ozempic 0.54m weekly Metformin 10041mbid Levemir 20 units daily Interventions: Provided diet and exercise counseling. Reviewed goal glucose readings for an A1c of <7%, we want to see fasting sugars <130 and 2 hour after meal sugars <180.  Reviewed patient assistance eligibility for Ozempic. Patient states income within limits now that his wife has retired. Will assist in applying Patient self care activities - Over the next 90 days, patient will: Check blood sugar 2-3 times weekly document, and provide at future appointments Contact provider with any episodes of hypoglycemia Provide required portion of PAP documentation.  Medication management Pharmacist Clinical Goal(s): Over the next 90 days, patient will work with PharmD and providers to achieve optimal medication adherence Current pharmacy: [facility] Interventions Comprehensive medication review performed. Continue current medication management strategy Patient self care activities - Over the next 90 days, patient will: Focus on medication adherence by fill dates Take medications as prescribed Report any questions or concerns to PharmD and/or provider(s)  Initial goal documentation       PharmD "I want to stay healthy" (pt-stated)      Matthew [name] IIsee longtitudinal plan of care for additional care plan information)  Current Barriers:  Diabetes: uncontrolled but improved per glucose readings; complicated by chronic medical conditions including HTN, HLD, most recent A1c 7.3% Most recent eGFR: ~44 mL Current antihyperglycemic regimen:  metformin 1000 mg BID, Levemir 20 units daily; Ozempic 0.5 mg weekly Current blood glucose readings:  Not checking [place] and did not take meter. Reports BG ranges from 116-180 fasting and  post prandial. Randomly checking unless BG 160-170 then checks consistently for a couple days. Cardiovascular risk reduction: Current hypertensive regimen: benazepril 40 mg daily ; never started HCTZ  stating has never had a problem with BP,not checking BP at  Current hyperlipidemia regimen: atorvastatin 20 mg daily, LDL at goal <70 on last check Current antiplatelet regimen: ASA 81 mg daily   Pharmacist Clinical Goal(s):  Over the next 90 days, patient will work with PharmD and primary care provider to address optimized medication management  Interventions: Comprehensive medication review performed, medication list updated in electronic medical record Inter-disciplinary care team collaboration (see longitudinal plan of care) Verified Ozempic dose has been increased to 0.5 mg weekly. Instructed that if low blood sugars develop (fastings <100), Patient verbalized understanding Reviewed benefit of occasional BP checks at home to ensure maintained goals. He verbalized understanding Reiterated benefit of ASA and statin for cardiovascular risk reduction.  Patient states he will be in Friday morning for his 2nd dose of shingles vaccine. He requests a sample of Ozempic if available. States he will not qualify for patient assistance income and can pay $100 copay.  Patient Self Care Activities:  Patient will check blood glucose BID-TID, document, and provide at future appointment Patient will take medications as prescribed Patient will report any questions or concerns to provider   Please see past updates related to this goal by clicking on the "Past Updates" button in the selected goal        Weight (lb) < 200 lb (90.7 kg)      Loose 20 pounds          This is a list of the screening recommended for you and due dates:  Health Maintenance  Topic Date Due   Eye exam for diabetics  07/21/2021   COVID-19 Vaccine (5 - 2023-24 season) 10/28/2021   Colon Cancer Screening  12/27/2022*   Yearly  kidney health urinalysis for diabetes  04/22/2022   Hemoglobin A1C  06/27/2022   Complete foot exam   09/23/2022   Yearly kidney function blood test for diabetes  12/27/2022   Medicare Annual Wellness Visit  02/07/2023   DTaP/Tdap/Td vaccine (3 - Td or Tdap) 12/15/2031   Pneumonia Vaccine  Completed   Flu Shot  Completed   Hepatitis C Screening: USPSTF Recommendation to screen - Ages 54-79 yo.  Completed   Zoster (Shingles) Vaccine  Completed   HPV Vaccine  Aged Out  *Topic was postponed. The date shown is not the original due date.    Advanced directives: Education provided  Conditions/risks identified:   Next appointment: Follow up in one year for your annual wellness visit.    02-22-2022 @ 10:00  [place] 65 Years and Older, Male  Preventive care refers to lifestyle choices and visits with your health care provider that can promote health and wellness. What does preventive care include? A yearly physical exam. This is also called an annual well check. Dental exams once or twice a year. Routine eye exams. Ask your health care provider how often you should have your eyes checked. Personal lifestyle choices, including: Daily care of your teeth and gums. Regular physical activity. Eating a healthy diet. Avoiding tobacco and drug use. Limiting alcohol use. Practicing safe sex.  Taking low doses of aspirin every day. Taking vitamin and mineral supplements as recommended by your health care provider. What happens during an annual well check? The services and screenings done by your health care provider during your annual well check will depend on your age, overall health, lifestyle risk factors, and family history of disease. Counseling  Your health care provider may ask you questions about your: Alcohol use. Tobacco use. Drug use. Emotional well-being. Home and relationship well-being. Sexual activity. Eating habits. History of falls. Memory and ability to  understand (cognition). Work and work Statistician. Screening  You may have the following tests or measurements: Height, weight, and BMI. Blood pressure. Lipid and cholesterol levels. These may be checked every 5 years, or more frequently if you are over 33 years old. Skin check. Lung cancer screening. You may have this screening every year starting at age 96 if you have a 30-pack-year history of smoking and currently smoke or have quit within the past 15 years. Fecal occult blood test (FOBT) of the stool. You may have this test every year starting at age 79. Flexible sigmoidoscopy or colonoscopy. You may have a sigmoidoscopy every 5 years or a colonoscopy every 10 years starting at age 11. Prostate cancer screening. Recommendations will vary depending on your family history and other risks. Hepatitis C blood test. Hepatitis B blood test. Sexually transmitted disease (STD) testing. Diabetes screening. This is done by checking your blood sugar (glucose) after you have not eaten for a while (fasting). You may have this done every 1-3 years. Abdominal aortic aneurysm (AAA) screening. You may need this if you are a current or former smoker. Osteoporosis. You may be screened starting at age 41 if you are at high risk. Talk with your health care provider about your test results, treatment options, and if necessary, the need for more tests. Vaccines  Your health care provider may recommend certain vaccines, such as: Influenza vaccine. This is recommended every year. Tetanus, diphtheria, and acellular pertussis (Tdap, Td) vaccine. You may need a Td booster every 10 years. Zoster vaccine. You may need this after age 38. Pneumococcal 13-valent conjugate (PCV13) vaccine. One dose is recommended after age 17. Pneumococcal polysaccharide (PPSV23) vaccine. One dose is recommended after age 70. Talk to your health care provider about which screenings and vaccines you need and how often you need them. This  information is not intended to replace advice given to you by your health care provider. Make sure you discuss any questions you have with your health care provider. Document Released: 03/12/2015 Document Revised: 11/03/2015 Document Reviewed: 12/15/2014 Elsevier Interactive Patient Education  2017 [employer] Prevention in the Home Falls can cause injuries. They can happen to people of all ages. There are many things you can do to make your home safe and to help prevent falls. What can I do on the outside of my home? Regularly fix the edges of walkways and driveways and fix any cracks. Remove anything that might make you trip as you walk through a door, such as a raised step or threshold. Trim any bushes or trees on the path to your home. Use bright outdoor lighting. Clear any walking paths of anything that might make someone trip, such as rocks or tools. Regularly check to see if handrails are loose or broken. Make sure that both sides of any steps have handrails. Any raised decks and porches should have guardrails on the edges. Have any leaves, snow, or ice cleared regularly. Use  sand or salt on walking paths during winter. Clean up any spills in your garage right away. This includes oil or grease spills. What can I do in the bathroom? Use night lights. Install grab bars by the toilet and in the tub and shower. Do not use towel bars as grab bars. Use non-skid mats or decals in the tub or shower. If you need to sit down in the shower, use a plastic, non-slip stool. Keep the floor dry. Clean up any water that spills on the floor as soon as it happens. Remove soap buildup in the tub or shower regularly. Attach bath mats securely with double-sided non-slip rug tape. Do not have throw rugs and other things on the floor that can make you trip. What can I do in the bedroom? Use night lights. Make sure that you have a light by your bed that is easy to reach. Do not use any sheets or  blankets that are too big for your bed. They should not hang down onto the floor. Have a firm chair that has side arms. You can use this for support while you get dressed. Do not have throw rugs and other things on the floor that can make you trip. What can I do in the kitchen? Clean up any spills right away. Avoid walking on wet floors. Keep items that you use a lot in easy-to-reach places. If you need to reach something above you, use a strong step stool that has a grab bar. Keep electrical cords out of the way. Do not use floor polish or wax that makes floors slippery. If you must use wax, use non-skid floor wax. Do not have throw rugs and other things on the floor that can make you trip. What can I do with my stairs? Do not leave any items on the stairs. Make sure that there are handrails on both sides of the stairs and use them. Fix handrails that are broken or loose. Make sure that handrails are as long as the stairways. Check any carpeting to make sure that it is firmly attached to the stairs. Fix any carpet that is loose or worn. Avoid having throw rugs at the top or bottom of the stairs. If you do have throw rugs, attach them to the floor with carpet tape. Make sure that you have a light switch at the top of the stairs and the bottom of the stairs. If you do not have them, ask someone to add them for you. What else can I do to help prevent falls? Wear shoes that: Do not have high heels. Have rubber bottoms. Are comfortable and fit you well. Are closed at the toe. Do not wear sandals. If you use a stepladder: Make sure that it is fully opened. Do not climb a closed stepladder. Make sure that both sides of the stepladder are locked into place. Ask someone to hold it for you, if possible. Clearly mark and make sure that you can see: Any grab bars or handrails. First and last steps. Where the edge of each step is. Use tools that help you move around (mobility aids) if they are  needed. These include: Canes. Walkers. Scooters. Crutches. Turn on the lights when you go into a dark area. Replace any light bulbs as soon as they burn out. Set up your furniture so you have a clear path. Avoid moving your furniture around. If any of your floors are uneven, fix them. If there are any pets around you, be  aware of where they are. Review your medicines with your doctor. Some medicines can make you feel dizzy. This can increase your chance of falling. Ask your doctor what other things that you can do to help prevent falls. This information is not intended to replace advice given to you by your health care provider. Make sure you discuss any questions you have with your health care provider. Document Released: 12/10/2008 Document Revised: 07/22/2015 Document Reviewed: 03/20/2014 Elsevier Interactive Patient Education  2017 [employer].

## 2022-02-06 NOTE — Progress Notes (Signed)
Subjective:   Matthew [name] is a 76 y.o. male who presents for Medicare Annual/Subsequent preventive examination.  I connected with  Matthew [name] on 02/06/22 by a telephone enabled telemedicine application and verified that I am speaking with the correct person using two identifiers.   I discussed the limitations of evaluation and management by telemedicine. The patient expressed understanding and agreed to proceed.  Patient location: home  Provider location: Tele-health-home    Review of Systems     Cardiac Risk Factors include: advanced age (>74mn, >>69women);diabetes mellitus;male gender;obesity (BMI >30kg/m2);hypertension     Objective:    Today's Vitals   02/06/22 1237  PainSc: 0-No pain   There is no height or weight on file to calculate BMI.     02/06/2022   12:38 PM 04/15/2021    7:39 AM 03/29/2021   11:20 AM 03/17/2021   10:00 PM 03/17/2021    4:10 PM 01/17/2021    8:22 AM 12/23/2020    8:41 AM  Advanced Directives  Does Patient Have a Medical Advance Directive? Yes Yes Yes Yes No Yes Yes  Type of Advance Directive Healthcare Power of Attorney Living will Living will Living will  [place]  Does patient want to make changes to medical advance directive?  No - Patient declined  No - Patient declined     Copy of [place] Chart? Yes - validated most recent copy scanned in chart (See row information)     Yes - validated most recent copy scanned in chart (See row information) No - copy requested    Current Medications (verified) Outpatient Encounter Medications as of 02/06/2022  Medication Sig   aspirin EC 81 MG tablet Take 81 mg by mouth daily. Swallow whole.   atorvastatin (LIPITOR) 20 MG tablet Take 1 tablet (20 mg total) by mouth daily.   benazepril (LOTENSIN) 40 MG tablet Take 1 tablet (40 mg total) by mouth daily.   empagliflozin (JARDIANCE) 25 MG TABS tablet Take 1 tablet (25 mg total) by mouth daily before  breakfast.   HYDROcodone-acetaminophen (NORCO/VICODIN) 5-325 MG tablet Take 1 tablet by mouth every 4 (four) hours as needed.   insulin detemir (LEVEMIR FLEXTOUCH) 100 UNIT/ML FlexPen Inject 42 to 50 units into the skin at bedtime.   Insulin Pen Needle 31G X 8 MM MISC Use one needle to administer insulin into skin nightly.   lidocaine (LIDODERM) 5 % Place 1 patch onto the skin daily. Remove & Discard patch within 12 hours or as directed by MD   metFORMIN (GLUCOPHAGE) 1000 MG tablet Take 1 tablet (1,000 mg total) by mouth 2 (two) times daily with a meal.   Multiple Vitamins-Minerals (CENTRUM SILVER 50+MEN) TABS Take 1 tablet by mouth daily.   mupirocin ointment (BACTROBAN) 2 % Apply 1 Application topically 2 (two) times daily.   vitamin B-12 (CYANOCOBALAMIN) 500 MCG tablet Take 500 mcg by mouth daily.   No facility-administered encounter medications on file as of 02/06/2022.    Allergies (verified) Ozempic (0.25 or 0.5 mg-dose) [semaglutide(0.25 or 0.572mdos)]   History: Past Medical History:  Diagnosis Date   Arthritis    bil knees and hips and back   Asthma    as a child   Hyperlipidemia    Hypertension    Sleep apnea    does not use cpap since losing weight   SVT (supraventricular tachycardia) 03/17/2021   Type 2 diabetes mellitus ([place]   Past Surgical History:  Procedure Laterality Date   COLONOSCOPY     CYSTOSCOPY W/ RETROGRADES Left 04/15/2021   Procedure: CYSTOSCOPY WITH RETROGRADE PYELOGRAM;  Surgeon: Billey Co, MD;  Location: [facility] ORS;  Service: Urology;  Laterality: Left;   HERNIA REPAIR     inguinal   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 N/A 04/15/2021   Procedure: 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;  Surgeon: Billey Co, MD;  Location: [facility] OR[facility];  Service: Urology;  Laterality: N/A;   KNEE ARTHROSCOPY Right 12/23/2020   Procedure: RIGHT ARTHROSCOPY KNEE PARTIAL MEDIAL AND LATERAL MENISCECTOMY;  Surgeon:  Thornton Park, MD;  Location: [facility] OR[facility];  Service: Orthopedics;  Laterality: Right;   Family History  Problem Relation Age of Onset   Diabetes Mother    Hypertension Father    Diabetes Sister    Diabetes Brother    Diabetes Maternal Grandmother    Social History   Socioeconomic History   Marital status: Married    Spouse name: Matthew [name]   Number of children: 4   Years of education: Not on file   Highest education level: Not on file  Occupational History   Occupation: retired  Tobacco Use   Smoking status: Never   Smokeless tobacco: Never  Vaping Use   Vaping Use: Never used  Substance and Sexual Activity   Alcohol use: No    Comment: rare   Drug use: No   Sexual activity: Not Currently    Birth control/protection: None  Other Topics Concern   Not on file  Social History Narrative   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   Social Determinants of Health   Financial Resource Strain: [place]  (02/06/2022)   Overall Financial Resource Strain (CARDIA)    Difficulty of Paying Living Expenses: Not hard at all  Food Insecurity: No Food Insecurity (02/06/2022)   Hunger Vital Sign    Worried About Running Out of Food in the Last Year: Never true    [place] in the Last Year: Never true  Transportation Needs: No Transportation Needs (02/06/2022)   PRAPARE - Hydrologist (Medical): No    Lack of Transportation (Non-Medical): No  Physical Activity: Insufficiently Active (02/06/2022)   Exercise Vital Sign    Days of Exercise per Week: 3 days    Minutes of Exercise per Session: 30 min  Stress: No Stress Concern Present (02/06/2022)   [place]    Feeling of Stress : Not at all  Social Connections: [place] (02/06/2022)   Social Connection and Isolation Panel [NHANES]    Frequency of Communication with Friends and Family: More  than three times a week    Frequency of Social Gatherings with Friends and Family: Three times a week    Attends Religious Services: More than 4 times per year    Active Member of Clubs or Organizations: No    Attends Music therapist: More than 4 times per year    Marital Status: Married    Tobacco Counseling Counseling given: Not Answered   Clinical Intake:  Pre-visit preparation completed: Yes  Pain : No/denies pain Pain Score: 0-No pain     Diabetes: Yes CBG done?: No Did pt. bring in CBG monitor from home?: No  How often do you need to have someone help you when you read instructions, pamphlets, or other written materials from your doctor  or pharmacy?: 1 - Never  Diabetic?  Yes  Nutrition Risk Assessment:  Has the patient had any N/V/D within the last 2 months?  No  Does the patient have any non-healing wounds?  No  Has the patient had any unintentional weight loss or weight gain?  No   Diabetes:  Is the patient diabetic?  Yes  If diabetic, was a CBG obtained today?  No  Did the patient bring in their glucometer from home?  No  How often do you monitor your CBG's? 2 x a day.   Financial Strains and Diabetes Management:  Are you having any financial strains with the device, your supplies or your medication? No .  Does the patient want to be seen by Chronic Care Management for management of their diabetes?  No  Would the patient like to be referred to a Nutritionist or for Diabetic Management?  No   Diabetic Exams:  Diabetic Eye Exam:. Pt has been advised about the importance in completing this exam  Diabetic Foot Exam: . Pt has been advised about the importance in completing this exam.    Interpreter Needed?: No  Information entered by :: Leroy Kennedy LPN   Activities of Daily Living    02/06/2022   12:58 PM 03/29/2021   11:20 AM  In your present state of health, do you have any difficulty performing the following activities:  Hearing? 0  0  Vision? 0 0  Difficulty concentrating or making decisions? 0 0  Walking or climbing stairs? 0 1  Dressing or bathing? 0 0  Doing errands, shopping? 0   Using the Toilet? N   In the past six months, have you accidently leaked urine? N   Do you have problems with loss of bowel control? N   Managing your Medications? N   Managing your Finances? N   Housekeeping or managing your Housekeeping? N     Patient Care Team: Venita Lick, NP as PCP - General (Nurse Practitioner)  Indicate any recent Medical Services you may have received from other than [facility] providers in the past year (date may be approximate).     Assessment:   This is a routine wellness examination for Beck.  Hearing/Vision screen Hearing Screening - Comments:: No trouble hearing Vision Screening - Comments:: Up to date Unsure of name  Dietary issues and exercise activities discussed: Current Exercise Habits: Structured exercise class, Type of exercise: strength training/weights (patient goes to PT 7 more appointments left), Time (Minutes): 40, Frequency (Times/Week): 3, Weekly Exercise (Minutes/Week): 120, Intensity: Mild, Exercise limited by: orthopedic condition(s)   Goals Addressed             This Visit's Progress    Patient Stated       No goals       Depression Screen    02/06/2022   12:44 PM 01/11/2022    4:20 PM 12/26/2021    3:48 PM 04/22/2021    9:35 AM 03/17/2021    4:00 PM 01/31/2021   11:30 AM 01/17/2021    8:34 AM  PHQ 2/9 Scores  PHQ - 2 Score 0 0 3 0 0 0 0  PHQ- 9 Score _0 0 9 3     Fall Risk    02/06/2022   12:38 PM 01/11/2022    4:20 PM 12/26/2021    3:47 PM 04/22/2021    9:34 AM 03/17/2021    4:00 PM  [place] in the past year?  1 0 0 1 0  Number falls in past yr: 0 0 0 1 0  Injury with Fall? 1 0 0 1 0  Risk for fall due to :  No Fall Risks No Fall Risks  No Fall Risks  Follow up Falls evaluation completed;Education provided;Falls prevention discussed  Falls evaluation completed Falls evaluation completed Falls evaluation completed Falls evaluation completed    FALL RISK PREVENTION PERTAINING TO THE HOME:  Any stairs in or around the home? No  If so, are there any without handrails? No  Home free of loose throw rugs in walkways, pet beds, electrical cords, etc? Yes  Adequate lighting in your home to reduce risk of falls? Yes   ASSISTIVE DEVICES UTILIZED TO PREVENT FALLS:  Life alert? No  Use of a cane, walker or w/c? No  Grab bars in the bathroom? No  Shower chair or bench in shower? No  Elevated toilet seat or a handicapped toilet? No   TIMED UP AND GO:  Was the test performed? No .    Cognitive Function:        02/06/2022   12:41 PM 06/16/2019    9:31 AM 08/08/2017    9:49 AM  6CIT Screen  What Year? 0 points 0 points 0 points  What month? 0 points 0 points 0 points  What time? 0 points 0 points 0 points  Count back from 20 0 points 0 points 0 points  Months in reverse 0 points 0 points 0 points  Repeat phrase 0 points 0 points 0 points  Total Score 0 points 0 points 0 points    Immunizations Immunization History  Administered Date(s) Administered   Fluad Quad(high Dose 65+) 01/04/2021, 12/16/2021   Influenza, High Dose Seasonal PF 12/29/2013, 11/15/2015   Influenza,inj,Quad PF,6+ Mos 12/30/2014, 12/04/2018   Influenza-Unspecified 12/29/2013, 11/15/2015, 12/25/2016   PFIZER(Purple Top)SARS-COV-2 Vaccination 04/24/2019, 05/20/2019, 07/13/2020, 01/04/2021   Pneumococcal Conjugate-13 12/30/2014   Pneumococcal Polysaccharide-23 03/24/2019   Pneumococcal-Unspecified 06/16/2011   Tdap 03/15/2011, 12/14/2021   Zoster Recombinat (Shingrix) 04/02/2019, 10/17/2019   Zoster, Live 06/16/2011    TDAP status: Up to date  Flu Vaccine status: Up to date  Pneumococcal vaccine status: Up to date  Covid-19 vaccine status: Information provided on how to obtain vaccines.   Qualifies for Shingles Vaccine? No   Zostavax  completed No   Shingrix Completed?: Yes  Screening Tests Health Maintenance  Topic Date Due   OPHTHALMOLOGY EXAM  07/21/2021   COVID-19 Vaccine (5 - 2023-24 season) 10/28/2021   COLONOSCOPY (Pts 45-67yr Insurance coverage will need to be confirmed)  12/27/2022 (Originally 06/28/2021)   Diabetic kidney evaluation - Urine ACR  04/22/2022   HEMOGLOBIN A1C  06/27/2022   FOOT EXAM  09/23/2022   Diabetic kidney evaluation - eGFR measurement  12/27/2022   Medicare Annual Wellness (AWV)  02/07/2023   DTaP/Tdap/Td (3 - Td or Tdap) 12/15/2031   Pneumonia Vaccine 74 Years old  Completed   INFLUENZA VACCINE  Completed   Hepatitis C Screening  Completed   Zoster Vaccines- Shingrix  Completed   HPV VACCINES  Aged Out    Health Maintenance  Health Maintenance Due  Topic Date Due   OPHTHALMOLOGY EXAM  07/21/2021   COVID-19 Vaccine (5 - 2023-24 season) 10/28/2021    Colonoscopy declined  Lung Cancer Screening: (Low Dose CT Chest recommended if Age 339-80years, 30 pack-year currently smoking OR have quit w/in 15years.) does not qualify.   Lung Cancer Screening Referral:   Additional Screening:  Hepatitis C Screening: does not qualify; Completed 2019  Vision Screening: Recommended annual ophthalmology exams for early detection of glaucoma and other disorders of the eye. Is the patient up to date with their annual eye exam?  Yes  Who is the provider or what is the name of the office in which the patient attends annual eye exams? unsure If pt is not established with a provider, would they like to be referred to a provider to establish care? No .   Dental Screening: Recommended annual dental exams for proper oral hygiene  Community Resource Referral / Chronic Care Management: CRR required this visit?  No   CCM required this visit?  No      Plan:     I have personally reviewed and noted the following in the patient's chart:   Medical and social history Use of alcohol, tobacco or  illicit drugs  Current medications and supplements including opioid prescriptions. Patient is not currently taking opioid prescriptions. Functional ability and status Nutritional status Physical activity Advanced directives List of other physicians Hospitalizations, surgeries, and ER visits in previous 12 months Vitals Screenings to include cognitive, depression, and falls Referrals and appointments  In addition, I have reviewed and discussed with patient certain preventive protocols, quality metrics, and best practice recommendations. A written personalized care plan for preventive services as well as general preventive health recommendations were provided to patient.     Leroy Kennedy, LPN   85/27/7824   Nurse Notes:

## 2022-02-15 DIAGNOSIS — R293 Abnormal posture: Secondary | ICD-10-CM | POA: Diagnosis not present

## 2022-02-15 DIAGNOSIS — G8911 Acute pain due to trauma: Secondary | ICD-10-CM | POA: Diagnosis not present

## 2022-02-15 DIAGNOSIS — M25511 Pain in right shoulder: Secondary | ICD-10-CM | POA: Diagnosis not present

## 2022-02-15 DIAGNOSIS — R29898 Other symptoms and signs involving the musculoskeletal system: Secondary | ICD-10-CM | POA: Diagnosis not present

## 2022-02-15 DIAGNOSIS — M25611 Stiffness of right shoulder, not elsewhere classified: Secondary | ICD-10-CM | POA: Diagnosis not present

## 2022-02-22 ENCOUNTER — Ambulatory Visit: Payer: Medicare HMO | Admitting: Nurse Practitioner

## 2022-02-24 ENCOUNTER — Ambulatory Visit (INDEPENDENT_AMBULATORY_CARE_PROVIDER_SITE_OTHER): Payer: Medicare HMO | Admitting: Nurse Practitioner

## 2022-02-24 ENCOUNTER — Encounter: Payer: Self-pay | Admitting: Nurse Practitioner

## 2022-02-24 VITALS — BP 138/70 | HR 81 | Temp 97.7°F | Ht 69.69 in | Wt 239.0 lb

## 2022-02-24 DIAGNOSIS — M1990 Unspecified osteoarthritis, unspecified site: Secondary | ICD-10-CM

## 2022-02-24 MED ORDER — INSULIN GLARGINE 100 UNIT/ML SOLOSTAR PEN: 50.0000 [IU] | PEN_INJECTOR | Freq: Every day | SUBCUTANEOUS | 11 refills | Status: DC

## 2022-02-24 NOTE — Patient Instructions (Signed)
Diabetes Mellitus Basics  Diabetes mellitus, or diabetes, is a long-term (chronic) disease. It occurs when the body does not properly use sugar (glucose) that is released from food after you eat. Diabetes mellitus may be caused by one or both of these problems: Your pancreas does not make enough of a hormone called insulin. Your body does not react in a normal way to the insulin that it makes. Insulin lets glucose enter cells in your body. This gives you energy. If you have diabetes, glucose cannot get into cells. This causes high blood glucose (hyperglycemia). How to treat and manage diabetes You may need to take insulin or other diabetes medicines daily to keep your glucose in balance. If you are prescribed insulin, you will learn how to give yourself insulin by injection. You may need to adjust the amount of insulin you take based on the foods that you eat. You will need to check your blood glucose levels using a glucose monitor as told by your health care provider. The readings can help determine if you have low or high blood glucose. Generally, you should have these blood glucose levels: Before meals (preprandial): 80-130 mg/dL (4.4-7.2 mmol/L). After meals (postprandial): below 180 mg/dL (10 mmol/L). Hemoglobin A1c (HbA1c) level: less than 7%. Your health care provider will set treatment goals for you. Keep all follow-up visits. This is important. Follow these instructions at home: Diabetes medicines Take your diabetes medicines every day as told by your health care provider. List your diabetes medicines here: Name of medicine: ______________________________ Amount (dose): _______________ Time (a.m./p.m.): _______________ Notes: ___________________________________ Name of medicine: ______________________________ Amount (dose): _______________ Time (a.m./p.m.): _______________ Notes: ___________________________________ Name of medicine: ______________________________ Amount (dose):  _______________ Time (a.m./p.m.): _______________ Notes: ___________________________________ Insulin If you use insulin, list the types of insulin you use here: Insulin type: ______________________________ Amount (dose): _______________ Time (a.m./p.m.): _______________Notes: ___________________________________ Insulin type: ______________________________ Amount (dose): _______________ Time (a.m./p.m.): _______________ Notes: ___________________________________ Insulin type: ______________________________ Amount (dose): _______________ Time (a.m./p.m.): _______________ Notes: ___________________________________ Insulin type: ______________________________ Amount (dose): _______________ Time (a.m./p.m.): _______________ Notes: ___________________________________ Insulin type: ______________________________ Amount (dose): _______________ Time (a.m./p.m.): _______________ Notes: ___________________________________ Managing blood glucose  Check your blood glucose levels using a glucose monitor as told by your health care provider. Write down the times that you check your glucose levels here: Time: _______________ Notes: ___________________________________ Time: _______________ Notes: ___________________________________ Time: _______________ Notes: ___________________________________ Time: _______________ Notes: ___________________________________ Time: _______________ Notes: ___________________________________ Time: _______________ Notes: ___________________________________  Low blood glucose Low blood glucose (hypoglycemia) is when glucose is at or below 70 mg/dL (3.9 mmol/L). Symptoms may include: Feeling: Hungry. Sweaty and clammy. Irritable or easily upset. Dizzy. Sleepy. Having: A fast heartbeat. A headache. A change in your vision. Numbness around the mouth, lips, or tongue. Having trouble with: Moving (coordination). Sleeping. Treating low blood glucose To treat low blood  glucose, eat or drink something containing sugar right away. If you can think clearly and swallow safely, follow the 15:15 rule: Take 15 grams of a fast-acting carb (carbohydrate), as told by your health care provider. Some fast-acting carbs are: Glucose tablets: take 3-4 tablets. Hard candy: eat 3-5 pieces. Fruit juice: drink 4 oz (120 mL). Regular (not diet) soda: drink 4-6 oz (120-180 mL). Honey or sugar: eat 1 Tbsp (15 mL). Check your blood glucose levels 15 minutes after you take the carb. If your glucose is still at or below 70 mg/dL (3.9 mmol/L), take 15 grams of a carb again. If your glucose does not go above 70 mg/dL (3.9 mmol/L) after   3 tries, get help right away. After your glucose goes back to normal, eat a meal or a snack within 1 hour. Treating very low blood glucose If your glucose is at or below 54 mg/dL (3 mmol/L), you have very low blood glucose (severe hypoglycemia). This is an emergency. Do not wait to see if the symptoms will go away. Get medical help right away. Call your local emergency services (911 in the [place] Do not drive yourself to the hospital. Questions to ask your health care provider Should I talk with a diabetes educator? What equipment will I need to care for myself at home? What diabetes medicines do I need? When should I take them? How often do I need to check my blood glucose levels? What number can I call if I have questions? When is my follow-up visit? Where can I find a support group for people with diabetes? Where to find more information American Diabetes Association: www.diabetes.org Association of Diabetes Care and Education Specialists: www.diabeteseducator.org Contact a health care provider if: Your blood glucose is at or above 240 mg/dL (13.3 mmol/L) for 2 days in a row. You have been sick or have had a fever for 2 days or more, and you are not getting better. You have any of these problems for more than 6 hours: You cannot eat or  drink. You feel nauseous. You vomit. You have diarrhea. Get help right away if: Your blood glucose is lower than 54 mg/dL (3 mmol/L). You get confused. You have trouble thinking clearly. You have trouble breathing. These symptoms may represent a serious problem that is an emergency. Do not wait to see if the symptoms will go away. Get medical help right away. Call your local emergency services (911 in the [place] Do not drive yourself to the hospital. Summary Diabetes mellitus is a chronic disease that occurs when the body does not properly use sugar (glucose) that is released from food after you eat. Take insulin and diabetes medicines as told. Check your blood glucose every day, as often as told. Keep all follow-up visits. This is important. This information is not intended to replace advice given to you by your health care provider. Make sure you discuss any questions you have with your health care provider. Document Revised: 06/17/2019 Document Reviewed: 06/17/2019 Elsevier Patient Education  2023 Elsevier Inc.  
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
## 2022-03-09 DIAGNOSIS — G8911 Acute pain due to trauma: Secondary | ICD-10-CM | POA: Diagnosis not present

## 2022-03-09 DIAGNOSIS — M25611 Stiffness of right shoulder, not elsewhere classified: Secondary | ICD-10-CM | POA: Diagnosis not present

## 2022-03-09 DIAGNOSIS — M25511 Pain in right shoulder: Secondary | ICD-10-CM | POA: Diagnosis not present

## 2022-03-09 DIAGNOSIS — R29898 Other symptoms and signs involving the musculoskeletal system: Secondary | ICD-10-CM | POA: Diagnosis not present

## 2022-03-09 DIAGNOSIS — R293 Abnormal posture: Secondary | ICD-10-CM | POA: Diagnosis not present

## 2022-03-24 ENCOUNTER — Other Ambulatory Visit: Payer: Self-pay | Admitting: Nurse Practitioner

## 2022-03-24 DIAGNOSIS — E119 Type 2 diabetes mellitus without complications: Secondary | ICD-10-CM

## 2022-03-24 NOTE — Telephone Encounter (Signed)
Requested medications are due for refill today.  yes  Requested medications are on the active medications list.  yes  Last refill. 12/20/2021 #180 0 rf  Future visit scheduled.   yes  Notes to clinic.  Rx written to expire 03/20/2022 - Rx is expired.    Requested Prescriptions  Pending Prescriptions Disp Refills   metFORMIN (GLUCOPHAGE) 1000 MG tablet [Pharmacy Med Name: METFORMIN HCL 1,000 MG TABLET] 180 tablet 0    Sig: Take 1 tablet (1,000 mg total) by mouth 2 (two) times daily with a meal.     Endocrinology:  Diabetes - Biguanides Failed - 03/24/2022  1:24 PM      Failed - Cr in normal range and within 360 days    Creatinine, Ser  Date Value Ref Range Status  12/26/2021 1.42 (H) 0.76 - 1.27 mg/dL Final         Failed - HBA1C is between 0 and 7.9 and within 180 days    HB A1C (BAYER DCA - WAIVED)  Date Value Ref Range Status  12/26/2021 9.2 (H) 4.8 - 5.6 % Final    Comment:             Prediabetes: 5.7 - 6.4          Diabetes: >6.4          Glycemic control for adults with diabetes: <7.0          Failed - eGFR in normal range and within 360 days    GFR calc Af Amer  Date Value Ref Range Status  09/19/2019 53 (L) >59 mL/min/1.73 Final    Comment:    **[facility] currently reports eGFR in compliance with the current**   recommendations of the [employer]. [facility] will   update reporting as new guidelines are published from the NKF-ASN   Task force.    GFR, Estimated  Date Value Ref Range Status  03/19/2021 55 (L) >60 mL/min Final    Comment:    (NOTE) Calculated using the CKD-EPI Creatinine Equation (2021)    eGFR  Date Value Ref Range Status  12/26/2021 52 (L) >59 mL/min/1.73 Final         Passed - B12 Level in normal range and within 720 days    Vitamin B-12  Date Value Ref Range Status  12/26/2021 458 232 - 1,245 pg/mL Final         Passed - Valid encounter within last 6 months    Recent Outpatient Visits           4 weeks ago  [place], Shelby T, NP   2 months ago Type 2 diabetes mellitus with hyperglycemia, with long-term current use of insulin ([place])   [place], Gilbert T, NP   2 months ago Type 2 diabetes mellitus with hyperglycemia, with long-term current use of insulin ([place])   [place], [place] T, NP   6 months ago Type 2 diabetes mellitus with hyperglycemia, with long-term current use of insulin ([place])   [place], Erin E, PA-C   11 months ago Diabetes mellitus without complication ([place])   [name], Avanti, MD       Future Appointments             In 3 days Cannady, Barbaraann Faster, NP [place]  Passed - CBC within normal limits and completed in the last 12 months    WBC  Date Value Ref Range Status  12/26/2021 7.8 3.4 - 10.8 x10E3/uL Final  03/19/2021 11.4 (H) 4.0 - 10.5 K/uL Final   RBC  Date Value Ref Range Status  12/26/2021 4.82 4.14 - 5.80 x10E6/uL Final  03/19/2021 3.74 (L) 4.22 - 5.81 MIL/uL Final   Hemoglobin  Date Value Ref Range Status  12/26/2021 14.3 13.0 - 17.7 g/dL Final   Hematocrit  Date Value Ref Range Status  12/26/2021 40.9 37.5 - 51.0 % Final   MCHC  Date Value Ref Range Status  12/26/2021 35.0 31.5 - 35.7 g/dL Final  03/19/2021 33.4 30.0 - 36.0 g/dL Final   [facility]  Date Value Ref Range Status  12/26/2021 29.7 26.6 - 33.0 pg Final  03/19/2021 29.4 26.0 - 34.0 pg Final   MCV  Date Value Ref Range Status  12/26/2021 85 79 - 97 fL Final   No results found for: "PLTCOUNTKUC", "LABPLAT", "POCPLA" RDW  Date Value Ref Range Status  12/26/2021 12.4 11.6 - 15.4 % Final

## 2022-03-25 NOTE — Patient Instructions (Incomplete)
Food Basics for Chronic Kidney Disease Chronic kidney disease (CKD) is when your kidneys are not working well. They cannot remove waste, fluids, and other substances from your blood the way they should. These substances can build up, which can worsen kidney damage and affect how your body works. Eating certain foods can lead to a buildup of these substances. Changing your diet can help prevent more kidney damage. Diet changes may also delay dialysis or even keep you from needing it. What nutrients should I limit? Work with your treatment team and a food expert (dietitian) to make a meal plan that's right for you. Foods you can eat and foods you should limit or avoid will depend on the stage of your kidney disease and any other health conditions you have. The items listed below are not a complete list. Talk with your dietitian to learn what is best for you. Potassium Potassium affects how steadily your heart beats. Too much potassium in your blood can cause an irregular heartbeat or even a heart attack. You may need to limit foods that are high in potassium, such as: Liquid milk and soy milk. Salt substitutes that contain potassium. Fruits like bananas, apricots, nectarines, melon, prunes, raisins, kiwi, and oranges. Vegetables, such as potatoes, sweet potatoes, yams, tomatoes, leafy greens, beets, avocado, pumpkin, and winter squash. Beans, like lima beans. Nuts. Phosphorus Phosphorus is a mineral found in your bones. You need a balance between calcium and phosphorus to build and maintain healthy bones. Too much added phosphorus from the foods you eat can pull calcium from your bones. Losing calcium can make your bones weak and more likely to break. Too much phosphorus can also make your skin itch. You may need to limit foods that are high in phosphorus or that have added phosphorus, such as: Liquid milk and dairy products. Dark-colored sodas or soft drinks. Bran cereals and  oatmeal. Protein  Protein helps you make and keep muscle. Protein also helps to repair your body's cells and tissues. One of the natural breakdown products of protein is a waste product called urea. When your kidneys are not working well, they cannot remove types of waste like urea. Reducing protein in your diet can help keep urea from building up in your blood. Depending on your stage of kidney disease, you may need to eat smaller portions of foods that are high in protein. Sources of animal protein include: Meat (all types). Fish and seafood. Poultry. Eggs. Dairy. Other protein foods include: Beans and legumes. Nuts and nut butter. Soy, like tofu.  Sodium Salt (sodium) helps to keep a healthy balance of fluids in your body. Too much salt can increase your blood pressure, which can harm your heart and lungs. Extra salt can also cause your body to keep too much fluid, making your kidneys work harder. You may need to limit or avoid foods that are high in salt, such as: Salt seasonings. Soy and teriyaki sauce. Packaged, precooked, cured, or processed meats, such as sausages or meat loaves. Sardines. Salted crackers and snack foods. Fast food. Canned soups and most canned foods. Pickled foods. Vegetable juice. Boxed mixes or ready-to-eat boxed meals and side dishes. Bottled dressings, sauces, and marinades. Talk with your dietitian about how much potassium, phosphorus, protein, and salt you may have each day. Helpful tips Read food labels  Check the amount of salt in foods. Limit foods that have salt or sodium listed among the first five ingredients. Try to eat low-salt foods. Check the ingredient list   for added phosphorus or potassium. "Phos" in an ingredient is a sign that phosphorus has been added. Do not buy foods that are calcium-enriched or that have calcium added to them (are fortified). Buy canned vegetables and beans that say "no salt added" and rinse them before  eating. Lifestyle Limit the amount of protein you eat from animal sources each day. Focus on protein from plant sources, like tofu and dried beans, peas, and lentils. Do not add salt to food when cooking or before eating. Do not eat star fruit. It can be toxic for people with kidney problems. Talk with your health care provider before taking any vitamin or mineral supplements. If told by your health care provider, track how much liquid you drink so you can avoid drinking too much. You may need to include foods you eat that are made mostly from water, like gelatin, ice cream, soups, and juicy fruits and vegetables. If you have diabetes: If you have diabetes (diabetes mellitus) and CKD, you need to keep your blood sugar (glucose) in the target range recommended by your health care provider. Follow your diabetes management plan. This may include: Checking your blood glucose regularly. Taking medicines by mouth, or taking insulin, or both. Exercising for at least 30 minutes on 5 or more days each week, or as told by your health care provider. Tracking how many servings of carbohydrates you eat at each meal. Not using orange juice to treat low blood sugars. Instead, use apple juice, cranberry juice, or clear soda. You may be given guidelines on what foods and nutrients you may eat, and how much you can have each day. This depends on your stage of kidney disease and whether you have high blood pressure (hypertension). Follow the meal plan your dietitian gives you. To learn more: National Institute of Diabetes and Digestive and Kidney Diseases: niddk.nih.gov National Kidney Foundation: kidney.org Summary Chronic kidney disease (CKD) is when your kidneys are not working well. They cannot remove waste, fluids, and other substances from your blood the way they should. These substances can build up, which can worsen kidney damage and affect how your body works. Changing your diet can help prevent more  kidney damage. Diet changes may also delay dialysis or even keep you from needing it. Diet changes are different for each person with CKD. Work with a dietitian to set up a meal plan that is right for you. This information is not intended to replace advice given to you by your health care provider. Make sure you discuss any questions you have with your health care provider. Document Revised: 06/03/2021 Document Reviewed: 06/09/2019 Elsevier Patient Education  2023 Elsevier Inc.  

## 2022-03-27 ENCOUNTER — Encounter: Payer: Self-pay | Admitting: Nurse Practitioner

## 2022-03-27 ENCOUNTER — Ambulatory Visit (INDEPENDENT_AMBULATORY_CARE_PROVIDER_SITE_OTHER): Payer: Medicare HMO | Admitting: Nurse Practitioner

## 2022-03-27 VITALS — BP 129/72 | HR 94 | Temp 97.9°F | Ht 69.69 in | Wt 240.4 lb

## 2022-03-27 DIAGNOSIS — E6609 Other obesity due to excess calories: Secondary | ICD-10-CM | POA: Diagnosis not present

## 2022-03-27 DIAGNOSIS — I152 Hypertension secondary to endocrine disorders: Secondary | ICD-10-CM

## 2022-03-27 DIAGNOSIS — E1159 Type 2 diabetes mellitus with other circulatory complications: Secondary | ICD-10-CM

## 2022-03-27 DIAGNOSIS — N1831 Chronic kidney disease, stage 3a: Secondary | ICD-10-CM

## 2022-03-27 DIAGNOSIS — N183 Chronic kidney disease, stage 3 unspecified: Secondary | ICD-10-CM

## 2022-03-27 DIAGNOSIS — E1169 Type 2 diabetes mellitus with other specified complication: Secondary | ICD-10-CM | POA: Diagnosis not present

## 2022-03-27 DIAGNOSIS — E1122 Type 2 diabetes mellitus with diabetic chronic kidney disease: Secondary | ICD-10-CM

## 2022-03-27 DIAGNOSIS — I471 Supraventricular tachycardia, unspecified: Secondary | ICD-10-CM | POA: Diagnosis not present

## 2022-03-27 DIAGNOSIS — E785 Hyperlipidemia, unspecified: Secondary | ICD-10-CM | POA: Diagnosis not present

## 2022-03-27 DIAGNOSIS — E538 Deficiency of other specified B group vitamins: Secondary | ICD-10-CM

## 2022-03-27 DIAGNOSIS — Z794 Long term (current) use of insulin: Secondary | ICD-10-CM

## 2022-03-27 DIAGNOSIS — E1165 Type 2 diabetes mellitus with hyperglycemia: Secondary | ICD-10-CM | POA: Diagnosis not present

## 2022-03-27 DIAGNOSIS — Z6833 Body mass index (BMI) 33.0-33.9, adult: Secondary | ICD-10-CM

## 2022-03-27 LAB — MICROALBUMIN, URINE WAIVED
Creatinine, Urine Waived: 50 mg/dL (ref 10–300)
Microalb, Ur Waived: 10 mg/L (ref 0–19)

## 2022-03-27 LAB — BAYER DCA HB A1C WAIVED: HB A1C (BAYER DCA - WAIVED): 10 % — ABNORMAL HIGH (ref 4.8–5.6)

## 2022-03-27 NOTE — Assessment & Plan Note (Signed)
Chronic, ongoing.  Continue current medication regimen and adjust as needed. Lipid panel today. 
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
## 2022-03-27 NOTE — Assessment & Plan Note (Signed)
Chronic, ongoing.  BP well at goal today.  Recommend she monitor BP at least a few mornings a week at home and document.  DASH diet at home.  Continue current medication regimen and adjust as needed.  Labs today: CMP and urine ALB.  Benazepril for kidney protection in diabetes.

## 2022-03-27 NOTE — Assessment & Plan Note (Signed)
Chronic, ongoing.  CKD 3a recent labs, remaining stable.  Recheck CMP today and refer to nephrology as needed for worsening.

## 2022-03-27 NOTE — Assessment & Plan Note (Signed)
Chronic, ongoing.  Remaining stable recent labs, will need to reduce Metformin to 500 MG BID if eGFR consistently below 45 -- currently above this.  Consider nephrology if worsening symptoms.

## 2022-03-27 NOTE — Assessment & Plan Note (Signed)
BMI 34.81.  Recommended eating smaller high protein, low fat meals more frequently and exercising 30 mins a day 5 times a week with a goal of 10-15lb weight loss in the next 3 months. Patient voiced their understanding and motivation to adhere to these recommendations.

## 2022-03-27 NOTE — Assessment & Plan Note (Signed)
Chronic, ongoing.  Recommend continue supplement daily.  Recheck level today.

## 2022-03-27 NOTE — Assessment & Plan Note (Signed)
Stable with no current BB, took Metoprolol in past, SVT was suspected to be related to infection at the time.

## 2022-03-28 NOTE — Progress Notes (Signed)
Good morning, please let Vennie know labs have returned with exception of B12 which is still pending.  Overall current labs remains stable.  There is still some chronic kidney disease stage 3 present with no worsening, however we need to get sugars under control or this could affect kidneys more.  If your sugars run >300 at home I want to know right away.  Ensure you are checking every day as we discussed.  Any questions? Keep being amazing!!  Thank you for allowing me to participate in your care.  I appreciate you. Kindest regards, Adham Johnson

## 2022-03-29 LAB — COMPREHENSIVE METABOLIC PANEL
ALT: 35 IU/L (ref 0–44)
AST: 29 IU/L (ref 0–40)
Albumin/Globulin Ratio: 2 (ref 1.2–2.2)
Albumin: 4.4 g/dL (ref 3.8–4.8)
Alkaline Phosphatase: 87 IU/L (ref 44–121)
BUN/Creatinine Ratio: 20 (ref 10–24)
BUN: 28 mg/dL — ABNORMAL HIGH (ref 8–27)
Bilirubin Total: 0.3 mg/dL (ref 0.0–1.2)
CO2: 20 mmol/L (ref 20–29)
Calcium: 9.3 mg/dL (ref 8.6–10.2)
Chloride: 99 mmol/L (ref 96–106)
Creatinine, Ser: 1.39 mg/dL — ABNORMAL HIGH (ref 0.76–1.27)
Globulin, Total: 2.2 g/dL (ref 1.5–4.5)
Glucose: 246 mg/dL — ABNORMAL HIGH (ref 70–99)
Potassium: 4.4 mmol/L (ref 3.5–5.2)
Sodium: 136 mmol/L (ref 134–144)
Total Protein: 6.6 g/dL (ref 6.0–8.5)
eGFR: 53 mL/min/{1.73_m2} — ABNORMAL LOW (ref >59)

## 2022-03-29 LAB — LIPID PANEL W/O CHOL/HDL RATIO
Cholesterol, Total: 149 mg/dL (ref 100–199)
HDL: 40 mg/dL (ref >39)
LDL Chol Calc (NIH): 76 mg/dL (ref 0–99)
Triglycerides: 198 mg/dL — ABNORMAL HIGH (ref 0–149)
VLDL Cholesterol Cal: 33 mg/dL (ref 5–40)

## 2022-03-29 LAB — VITAMIN B12: Vitamin B-12: 502 pg/mL (ref 232–1245)

## 2022-04-03 ENCOUNTER — Telehealth: Payer: Self-pay

## 2022-04-03 NOTE — Progress Notes (Cosign Needed)
Care Management & Coordination Services Pharmacy Team  Reason for Encounter: Diabetes  Contacted patient to discuss diabetes disease state. Spoke with patient on 04/03/2022   Current antihyperglycemic regimen:  Lantus 100 units Metformin 1000 mg   Patient verbally confirms he is taking the above medications as directed. Yes  What diet changes have been made to improve diabetes control?  What recent interventions/DTPs have been made to improve glycemic control:  - Recommend increase Levemir to 55 units.  Continue Jardiance and Metformin at max doses.  Can not take GLP1.  Could consider trial of Januvia or meal insulin. Office visit 03/27/22  Have there been any recent hospitalizations or ED visits since last visit with PharmD? Yes  Patient denies hypoglycemic symptoms, including None  Patient denies hyperglycemic symptoms, including none  How often are you checking your blood sugar? once daily  What are your blood sugars ranging? Patient states blood sugars range between 88-133 Fasting: 80 this morning  During the week, how often does your blood glucose drop below 70? Never  Are you checking your feet daily/regularly? Yes, patient stated that at one point he was having some swelling but now the swelling is gone.  Adherence Review: Is the patient currently on a STATIN medication? Yes Is the patient currently on ACE/ARB medication? No Does the patient have >5 day gap between last estimated fill dates? No   Chart Updates: Recent office visits:  03/27/22 Marnee Guarneri T, NP (Type 2 diabetes) Orders placed: labs; Medication changes: none  02/24/22 Cannady, Henrine Screws T, NP (Arthritis) Orders placed: none; Medication changes: insulin glargine 50 units  Recent consult visits:  None  Hospital visits:  None in previous 6 months  Medications: Outpatient Encounter Medications as of 04/03/2022  Medication Sig   aspirin EC 81 MG tablet Take 81 mg by mouth daily. Swallow whole.    atorvastatin (LIPITOR) 20 MG tablet Take 1 tablet (20 mg total) by mouth daily.   benazepril (LOTENSIN) 40 MG tablet Take 1 tablet (40 mg total) by mouth daily.   empagliflozin (JARDIANCE) 25 MG TABS tablet Take 1 tablet (25 mg total) by mouth daily before breakfast.   insulin glargine (LANTUS) 100 UNIT/ML Solostar Pen Inject 50 Units into the skin at bedtime.   Insulin Pen Needle 31G X 8 MM MISC Use one needle to administer insulin into skin nightly.   lidocaine (LIDODERM) 5 % Place 1 patch onto the skin daily. Remove & Discard patch within 12 hours or as directed by MD   metFORMIN (GLUCOPHAGE) 1000 MG tablet Take 1 tablet (1,000 mg total) by mouth 2 (two) times daily with a meal.   Multiple Vitamins-Minerals (CENTRUM SILVER 50+MEN) TABS Take 1 tablet by mouth daily.   mupirocin ointment (BACTROBAN) 2 % Apply 1 Application topically 2 (two) times daily.   vitamin B-12 (CYANOCOBALAMIN) 500 MCG tablet Take 500 mcg by mouth daily.   No facility-administered encounter medications on file as of 04/03/2022.    Recent Relevant Labs: Lab Results  Component Value Date/Time   HGBA1C 10.0 (H) 03/27/2022 02:49 PM   HGBA1C 9.2 (H) 12/26/2021 04:39 PM   MICROALBUR 10 03/27/2022 02:49 PM   MICROALBUR 80 (H) 04/22/2021 09:29 AM    Kidney Function Lab Results  Component Value Date/Time   CREATININE 1.39 (H) 03/27/2022 02:51 PM   CREATININE 1.42 (H) 12/26/2021 04:42 PM   GFRNONAA 55 (L) 03/19/2021 07:13 AM   GFRAA 53 (L) 09/19/2019 09:16 AM    Star Rating Drugs: Yes Medication:  Last Fill: Day Supply Atorvastatin 20 mg 01/24/22, 10/19/21 100 ds Benazepril 40 mg 01/17/22, 10/19/21  90 ds Metformin 1000 mg 03/24/22, 12/20/21  90 ds  Care Gaps: Annual wellness visit in last year? No Last eye exam / retinopathy screening: Last diabetic foot exam:  Ethelene Hal

## 2022-04-11 DIAGNOSIS — M25519 Pain in unspecified shoulder: Secondary | ICD-10-CM | POA: Diagnosis not present

## 2022-04-18 ENCOUNTER — Other Ambulatory Visit: Payer: Self-pay | Admitting: Physician Assistant

## 2022-04-18 DIAGNOSIS — Z794 Long term (current) use of insulin: Secondary | ICD-10-CM

## 2022-04-18 NOTE — Telephone Encounter (Signed)
Received call to [place], [place] from patient's wife who is on DPR. She says she's at the pharmacy looking for this medication refill and the pharmacy has not received it yet. She says it was a request sent to the office on 04/12/22, so she thought it would be ready for pickup. Advised Alondra to let the wife know the medication will be sent in now.   Requested Prescriptions  Pending Prescriptions Disp Refills   JARDIANCE 25 MG TABS tablet [Pharmacy Med Name: JARDIANCE 25 MG TABLET] 30 tablet 0    Sig: Take 1 tablet (25 mg total) by mouth daily before breakfast.     Endocrinology:  Diabetes - SGLT2 Inhibitors Failed - 04/18/2022 12:51 PM      Failed - Cr in normal range and within 360 days    Creatinine, Ser  Date Value Ref Range Status  03/27/2022 1.39 (H) 0.76 - 1.27 mg/dL Final         Failed - HBA1C is between 0 and 7.9 and within 180 days    HB A1C (BAYER DCA - WAIVED)  Date Value Ref Range Status  03/27/2022 10.0 (H) 4.8 - 5.6 % Final    Comment:             Prediabetes: 5.7 - 6.4          Diabetes: >6.4          Glycemic control for adults with diabetes: <7.0          Failed - eGFR in normal range and within 360 days    GFR calc Af Amer  Date Value Ref Range Status  09/19/2019 53 (L) >59 mL/min/1.73 Final    Comment:    **[facility] currently reports eGFR in compliance with the current**   recommendations of the [employer]. [facility] will   update reporting as new guidelines are published from the NKF-ASN   Task force.    GFR, Estimated  Date Value Ref Range Status  03/19/2021 55 (L) >60 mL/min Final    Comment:    (NOTE) Calculated using the CKD-EPI Creatinine Equation (2021)    eGFR  Date Value Ref Range Status  03/27/2022 53 (L) >59 mL/min/1.73 Final         Passed - Valid encounter within last 6 months    Recent Outpatient Visits           3 weeks ago Type 2 diabetes mellitus with hyperglycemia, with long-term current use of insulin ([place])    [place], [place] T, NP   1 month ago [place], [place] T, NP   3 months ago Type 2 diabetes mellitus with hyperglycemia, with long-term current use of insulin ([place])   [place], [place] T, NP   3 months ago Type 2 diabetes mellitus with hyperglycemia, with long-term current use of insulin ([place])   [place], [place] T, NP   6 months ago Type 2 diabetes mellitus with hyperglycemia, with long-term current use of insulin ([place])   [place], PA-C       Future Appointments             In 2 months Cannady, Barbaraann Faster, NP [place], [facility]

## 2022-04-18 NOTE — Telephone Encounter (Signed)
Duplicate request from the pharmacy, will refuse this one.   Requested Prescriptions  Refused Prescriptions Disp Refills   JARDIANCE 25 MG TABS tablet [Pharmacy Med Name: JARDIANCE 25 MG TABLET] 30 tablet 0    Sig: Take 1 tablet (25 mg total) by mouth daily before breakfast.     Endocrinology:  Diabetes - SGLT2 Inhibitors Failed - 04/18/2022 12:56 PM      Failed - Cr in normal range and within 360 days    Creatinine, Ser  Date Value Ref Range Status  03/27/2022 1.39 (H) 0.76 - 1.27 mg/dL Final         Failed - HBA1C is between 0 and 7.9 and within 180 days    HB A1C (BAYER DCA - WAIVED)  Date Value Ref Range Status  03/27/2022 10.0 (H) 4.8 - 5.6 % Final    Comment:             Prediabetes: 5.7 - 6.4          Diabetes: >6.4          Glycemic control for adults with diabetes: <7.0          Failed - eGFR in normal range and within 360 days    GFR calc Af Amer  Date Value Ref Range Status  09/19/2019 53 (L) >59 mL/min/1.73 Final    Comment:    **[facility] currently reports eGFR in compliance with the current**   recommendations of the [employer]. [facility] will   update reporting as new guidelines are published from the NKF-ASN   Task force.    GFR, Estimated  Date Value Ref Range Status  03/19/2021 55 (L) >60 mL/min Final    Comment:    (NOTE) Calculated using the CKD-EPI Creatinine Equation (2021)    eGFR  Date Value Ref Range Status  03/27/2022 53 (L) >59 mL/min/1.73 Final         Passed - Valid encounter within last 6 months    Recent Outpatient Visits           3 weeks ago Type 2 diabetes mellitus with hyperglycemia, with long-term current use of insulin ([place])   [place], [place] T, NP   1 month ago [place], [place] T, NP   3 months ago Type 2 diabetes mellitus with hyperglycemia, with long-term current use of insulin ([place])   [place], [place] T, NP   3 months ago Type 2 diabetes mellitus with hyperglycemia, with long-term current use of insulin ([place])   [place], [place] T, NP   6 months ago Type 2 diabetes mellitus with hyperglycemia, with long-term current use of insulin ([place])   [place], PA-C       Future Appointments             In 2 months Cannady, Barbaraann Faster, NP [place], [facility]

## 2022-05-08 ENCOUNTER — Other Ambulatory Visit: Payer: Self-pay | Admitting: Nurse Practitioner

## 2022-05-08 DIAGNOSIS — E7800 Pure hypercholesterolemia, unspecified: Secondary | ICD-10-CM

## 2022-05-09 NOTE — Telephone Encounter (Signed)
Requested Prescriptions  Pending Prescriptions Disp Refills   atorvastatin (LIPITOR) 20 MG tablet [Pharmacy Med Name: ATORVASTATIN 20 MG TABLET] 90 tablet 2    Sig: Take 1 tablet (20 mg total) by mouth daily.     Cardiovascular:  Antilipid - Statins Failed - 05/08/2022  2:06 PM      Failed - Lipid Panel in normal range within the last 12 months    Cholesterol, Total  Date Value Ref Range Status  03/27/2022 149 100 - 199 mg/dL Final   Cholesterol Piccolo, Waived  Date Value Ref Range Status  03/19/2019 150 <200 mg/dL Final    Comment:                            Desirable                <200                         Borderline High      200- 239                         High                     >239    LDL Chol Calc (NIH)  Date Value Ref Range Status  03/27/2022 76 0 - 99 mg/dL Final   HDL  Date Value Ref Range Status  03/27/2022 40 >39 mg/dL Final   Triglycerides  Date Value Ref Range Status  03/27/2022 198 (H) 0 - 149 mg/dL Final   Triglycerides Piccolo,Waived  Date Value Ref Range Status  03/19/2019 224 (H) <150 mg/dL Final    Comment:                            Normal                   <150                         Borderline High     150 - 199                         High                200 - 499                         Very High                >499          Passed - Patient is not pregnant      Passed - Valid encounter within last 12 months    Recent Outpatient Visits           1 month ago Type 2 diabetes mellitus with hyperglycemia, with long-term current use of insulin ([place])   [place], [place] T, NP   2 months ago [place], [place] T, NP   3 months ago Type 2 diabetes mellitus with hyperglycemia, with long-term current use of insulin ([place])   [place], [place] T, NP   4 months ago Type 2 diabetes mellitus with hyperglycemia,  with long-term  current use of insulin ([place])   [place], [place] T, NP   7 months ago Type 2 diabetes mellitus with hyperglycemia, with long-term current use of insulin ([place])   [place], Dani Gobble, PA-C       Future Appointments             In 1 month Cannady, Barbaraann Faster, NP [place], [facility]

## 2022-05-31 ENCOUNTER — Telehealth: Payer: Self-pay
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
## 2022-06-15 NOTE — Telephone Encounter (Signed)
PPt is calling Myriam Back. Please leave telephone number in the notes on how the patient can reach RMA back please.

## 2022-06-26 ENCOUNTER — Ambulatory Visit: Payer: Medicare HMO | Admitting: Nurse Practitioner

## 2022-07-15 ENCOUNTER — Other Ambulatory Visit: Payer: Self-pay | Admitting: Nurse Practitioner

## 2022-07-15 DIAGNOSIS — E1165 Type 2 diabetes mellitus with hyperglycemia: Secondary | ICD-10-CM

## 2022-07-17 NOTE — Telephone Encounter (Signed)
Requested Prescriptions  Pending Prescriptions Disp Refills   JARDIANCE 25 MG TABS tablet [Pharmacy Med Name: JARDIANCE 25 MG TABLET] 30 tablet 0    Sig: Take 1 tablet (25 mg total) by mouth daily before breakfast.     Endocrinology:  Diabetes - SGLT2 Inhibitors Failed - 07/15/2022  9:58 AM      Failed - Cr in normal range and within 360 days    Creatinine, Ser  Date Value Ref Range Status  03/27/2022 1.39 (H) 0.76 - 1.27 mg/dL Final         Failed - HBA1C is between 0 and 7.9 and within 180 days    HB A1C (BAYER DCA - WAIVED)  Date Value Ref Range Status  03/27/2022 10.0 (H) 4.8 - 5.6 % Final    Comment:             Prediabetes: 5.7 - 6.4          Diabetes: >6.4          Glycemic control for adults with diabetes: <7.0          Failed - eGFR in normal range and within 360 days    GFR calc Af Amer  Date Value Ref Range Status  09/19/2019 53 (L) >59 mL/min/1.73 Final    Comment:    **[facility] currently reports eGFR in compliance with the current**   recommendations of the [employer]. [facility] will   update reporting as new guidelines are published from the NKF-ASN   Task force.    GFR, Estimated  Date Value Ref Range Status  03/19/2021 55 (L) >60 mL/min Final    Comment:    (NOTE) Calculated using the CKD-EPI Creatinine Equation (2021)    eGFR  Date Value Ref Range Status  03/27/2022 53 (L) >59 mL/min/1.73 Final         Passed - Valid encounter within last 6 months    Recent Outpatient Visits           3 months ago Type 2 diabetes mellitus with hyperglycemia, with long-term current use of insulin (HCC)   [place] [facility] [place], [place] T, NP   4 months ago Arthritis   [place] [facility] [place], [place] T, NP   6 months ago Type 2 diabetes mellitus with hyperglycemia, with long-term current use of insulin (HCC)   [place] [facility] [place], [place] T, NP   6 months ago Type 2 diabetes mellitus  with hyperglycemia, with long-term current use of insulin (HCC)   [place] [facility] Walsh, [place] T, NP   9 months ago Type 2 diabetes mellitus with hyperglycemia, with long-term current use of insulin (HCC)   [place] [facility] Mecum, Oswaldo Conroy, PA-C       Future Appointments             In 1 week Cannady, Dorie Rank, NP [facility], [facility]

## 2022-07-27 ENCOUNTER — Ambulatory Visit: Payer: Medicare HMO | Admitting: Nurse Practitioner

## 2022-07-27 DIAGNOSIS — I493 Ventricular premature depolarization: Secondary | ICD-10-CM

## 2022-07-27 DIAGNOSIS — N1831 Chronic kidney disease, stage 3a: Secondary | ICD-10-CM

## 2022-07-27 DIAGNOSIS — N183 Chronic kidney disease, stage 3 unspecified: Secondary | ICD-10-CM

## 2022-07-27 DIAGNOSIS — Z794 Long term (current) use of insulin: Secondary | ICD-10-CM

## 2022-07-27 DIAGNOSIS — I152 Hypertension secondary to endocrine disorders: Secondary | ICD-10-CM

## 2022-07-27 DIAGNOSIS — E1169 Type 2 diabetes mellitus with other specified complication: Secondary | ICD-10-CM

## 2022-07-27 DIAGNOSIS — E6609 Other obesity due to excess calories: Secondary | ICD-10-CM

## 2022-07-31 ENCOUNTER — Other Ambulatory Visit: Payer: Self-pay | Admitting: Nurse Practitioner

## 2022-07-31 DIAGNOSIS — I10 Essential (primary) hypertension: Secondary | ICD-10-CM

## 2022-08-01 NOTE — Telephone Encounter (Signed)
Requested Prescriptions  Pending Prescriptions Disp Refills   benazepril (LOTENSIN) 40 MG tablet [Pharmacy Med Name: BENAZEPRIL HCL 40 MG TABLET] 90 tablet 0    Sig: Take 1 tablet (40 mg total) by mouth daily.     Cardiovascular:  ACE Inhibitors Failed - 07/31/2022  3:44 PM      Failed - Cr in normal range and within 180 days    Creatinine, Ser  Date Value Ref Range Status  03/27/2022 1.39 (H) 0.76 - 1.27 mg/dL Final         Passed - K in normal range and within 180 days    Potassium  Date Value Ref Range Status  03/27/2022 4.4 3.5 - 5.2 mmol/L Final         Passed - Patient is not pregnant      Passed - Last BP in normal range    BP Readings from Last 1 Encounters:  03/27/22 129/72         Passed - Valid encounter within last 6 months    Recent Outpatient Visits           4 months ago Type 2 diabetes mellitus with hyperglycemia, with long-term current use of insulin (HCC)   [place] [facility] [place], [place] T, NP   5 months ago Arthritis   [place] [facility] [place], [place] T, NP   6 months ago Type 2 diabetes mellitus with hyperglycemia, with long-term current use of insulin (HCC)   [place] [facility] [place], [place] T, NP   7 months ago Type 2 diabetes mellitus with hyperglycemia, with long-term current use of insulin (HCC)   [place] [facility] [place], [place] T, NP   10 months ago Type 2 diabetes mellitus with hyperglycemia, with long-term current use of insulin (HCC)   [place] [facility] Mecum, Oswaldo Conroy, PA-C       Future Appointments             In 1 month Cannady, Dorie Rank, NP [place] [employer], [facility]

## 2022-08-08 ENCOUNTER — Telehealth: Payer: Self-pay
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
## 2022-09-22 ENCOUNTER — Ambulatory Visit: Payer: Medicare HMO | Admitting: Nurse Practitioner

## 2022-09-22 DIAGNOSIS — I471 Supraventricular tachycardia, unspecified: Secondary | ICD-10-CM

## 2022-09-22 DIAGNOSIS — E6609 Other obesity due to excess calories: Secondary | ICD-10-CM

## 2022-09-22 DIAGNOSIS — E1159 Type 2 diabetes mellitus with other circulatory complications: Secondary | ICD-10-CM

## 2022-09-22 DIAGNOSIS — E1169 Type 2 diabetes mellitus with other specified complication: Secondary | ICD-10-CM

## 2022-09-22 DIAGNOSIS — E1165 Type 2 diabetes mellitus with hyperglycemia: Secondary | ICD-10-CM

## 2022-09-22 DIAGNOSIS — N183 Chronic kidney disease, stage 3 unspecified: Secondary | ICD-10-CM

## 2022-09-22 DIAGNOSIS — E538 Deficiency of other specified B group vitamins: Secondary | ICD-10-CM

## 2022-10-17 ENCOUNTER — Other Ambulatory Visit: Payer: Self-pay | Admitting: Nurse Practitioner

## 2022-10-17 DIAGNOSIS — E1165 Type 2 diabetes mellitus with hyperglycemia: Secondary | ICD-10-CM

## 2022-10-18 NOTE — Telephone Encounter (Signed)
Patient is overdue for an appointment. Please call to schedule and then route to provider for refill.  

## 2022-10-18 NOTE — Telephone Encounter (Signed)
Requested medication (s) are due for refill today: yes  Requested medication (s) are on the active medication list: yes  Last refill:  07/17/22  Future visit scheduled: no  Notes to clinic:  Unable to refill per protocol, courtesy refill already given, routing for provider approval.      Requested Prescriptions  Pending Prescriptions Disp Refills   JARDIANCE 25 MG TABS tablet [Pharmacy Med Name: JARDIANCE 25 MG TABLET] 90 tablet 0    Sig: Take 1 tablet (25 mg total) by mouth daily before breakfast.     Endocrinology:  Diabetes - SGLT2 Inhibitors Failed - 10/17/2022  9:44 AM      Failed - Cr in normal range and within 360 days    Creatinine, Ser  Date Value Ref Range Status  03/27/2022 1.39 (H) 0.76 - 1.27 mg/dL Final         Failed - HBA1C is between 0 and 7.9 and within 180 days    HB A1C (BAYER DCA - WAIVED)  Date Value Ref Range Status  03/27/2022 10.0 (H) 4.8 - 5.6 % Final    Comment:             Prediabetes: 5.7 - 6.4          Diabetes: >6.4          Glycemic control for adults with diabetes: <7.0          Failed - eGFR in normal range and within 360 days    GFR calc Af Amer  Date Value Ref Range Status  09/19/2019 53 (L) >59 mL/min/1.73 Final    Comment:    **[facility] currently reports eGFR in compliance with the current**   recommendations of the [employer]. [facility] will   update reporting as new guidelines are published from the NKF-ASN   Task force.    GFR, Estimated  Date Value Ref Range Status  03/19/2021 55 (L) >60 mL/min Final    Comment:    (NOTE) Calculated using the CKD-EPI Creatinine Equation (2021)    eGFR  Date Value Ref Range Status  03/27/2022 53 (L) >59 mL/min/1.73 Final         Failed - Valid encounter within last 6 months    Recent Outpatient Visits           6 months ago Type 2 diabetes mellitus with hyperglycemia, with long-term current use of insulin (HCC)   [place] [facility] [place], [place] T,  NP   7 months ago Arthritis   [place] [facility] [place], [place] T, NP   9 months ago Type 2 diabetes mellitus with hyperglycemia, with long-term current use of insulin (HCC)   [place] [facility] [place], [place] T, NP   9 months ago Type 2 diabetes mellitus with hyperglycemia, with long-term current use of insulin (HCC)   [place] [facility] [place] T, NP   1 year ago Type 2 diabetes mellitus with hyperglycemia, with long-term current use of insulin (HCC)   [facility] Mecum, Oswaldo Conroy, PA-C

## 2022-10-19 NOTE — Telephone Encounter (Signed)
Called and scheduled patient on 11/01/2022 @ 2:20 pm.  Routing to provider.

## 2022-10-30 NOTE — Patient Instructions (Signed)
Diabetes Mellitus Basics  Diabetes mellitus, or diabetes, is a long-term (chronic) disease. It occurs when the body does not properly use sugar (glucose) that is released from food after you eat. Diabetes mellitus may be caused by one or both of these problems: Your pancreas does not make enough of a hormone called insulin. Your body does not react in a normal way to the insulin that it makes. Insulin lets glucose enter cells in your body. This gives you energy. If you have diabetes, glucose cannot get into cells. This causes high blood glucose (hyperglycemia). How to treat and manage diabetes You may need to take insulin or other diabetes medicines daily to keep your glucose in balance. If you are prescribed insulin, you will learn how to give yourself insulin by injection. You may need to adjust the amount of insulin you take based on the foods that you eat. You will need to check your blood glucose levels using a glucose monitor as told by your health care provider. The readings can help determine if you have low or high blood glucose. Generally, you should have these blood glucose levels: Before meals (preprandial): 80-130 mg/dL (4.4-7.2 mmol/L). After meals (postprandial): below 180 mg/dL (10 mmol/L). Hemoglobin A1c (HbA1c) level: less than 7%. Your health care provider will set treatment goals for you. Keep all follow-up visits. This is important. Follow these instructions at home: Diabetes medicines Take your diabetes medicines every day as told by your health care provider. List your diabetes medicines here: Name of medicine: ______________________________ Amount (dose): _______________ Time (a.m./p.m.): _______________ Notes: ___________________________________ Name of medicine: ______________________________ Amount (dose): _______________ Time (a.m./p.m.): _______________ Notes: ___________________________________ Name of medicine: ______________________________ Amount (dose):  _______________ Time (a.m./p.m.): _______________ Notes: ___________________________________ Insulin If you use insulin, list the types of insulin you use here: Insulin type: ______________________________ Amount (dose): _______________ Time (a.m./p.m.): _______________Notes: ___________________________________ Insulin type: ______________________________ Amount (dose): _______________ Time (a.m./p.m.): _______________ Notes: ___________________________________ Insulin type: ______________________________ Amount (dose): _______________ Time (a.m./p.m.): _______________ Notes: ___________________________________ Insulin type: ______________________________ Amount (dose): _______________ Time (a.m./p.m.): _______________ Notes: ___________________________________ Insulin type: ______________________________ Amount (dose): _______________ Time (a.m./p.m.): _______________ Notes: ___________________________________ Managing blood glucose  Check your blood glucose levels using a glucose monitor as told by your health care provider. Write down the times that you check your glucose levels here: Time: _______________ Notes: ___________________________________ Time: _______________ Notes: ___________________________________ Time: _______________ Notes: ___________________________________ Time: _______________ Notes: ___________________________________ Time: _______________ Notes: ___________________________________ Time: _______________ Notes: ___________________________________  Low blood glucose Low blood glucose (hypoglycemia) is when glucose is at or below 70 mg/dL (3.9 mmol/L). Symptoms may include: Feeling: Hungry. Sweaty and clammy. Irritable or easily upset. Dizzy. Sleepy. Having: A fast heartbeat. A headache. A change in your vision. Numbness around the mouth, lips, or tongue. Having trouble with: Moving (coordination). Sleeping. Treating low blood glucose To treat low blood  glucose, eat or drink something containing sugar right away. If you can think clearly and swallow safely, follow the 15:15 rule: Take 15 grams of a fast-acting carb (carbohydrate), as told by your health care provider. Some fast-acting carbs are: Glucose tablets: take 3-4 tablets. Hard candy: eat 3-5 pieces. Fruit juice: drink 4 oz (120 mL). Regular (not diet) soda: drink 4-6 oz (120-180 mL). Honey or sugar: eat 1 Tbsp (15 mL). Check your blood glucose levels 15 minutes after you take the carb. If your glucose is still at or below 70 mg/dL (3.9 mmol/L), take 15 grams of a carb again. If your glucose does not go above 70 mg/dL (3.9 mmol/L) after   3 tries, get help right away. After your glucose goes back to normal, eat a meal or a snack within 1 hour. Treating very low blood glucose If your glucose is at or below 54 mg/dL (3 mmol/L), you have very low blood glucose (severe hypoglycemia). This is an emergency. Do not wait to see if the symptoms will go away. Get medical help right away. Call your local emergency services (911 in the [place] Do not drive yourself to the hospital. Questions to ask your health care provider Should I talk with a diabetes educator? What equipment will I need to care for myself at home? What diabetes medicines do I need? When should I take them? How often do I need to check my blood glucose levels? What number can I call if I have questions? When is my follow-up visit? Where can I find a support group for people with diabetes? Where to find more information American Diabetes Association: www.diabetes.org Association of Diabetes Care and Education Specialists: www.diabeteseducator.org Contact a health care provider if: Your blood glucose is at or above 240 mg/dL (13.3 mmol/L) for 2 days in a row. You have been sick or have had a fever for 2 days or more, and you are not getting better. You have any of these problems for more than 6 hours: You cannot eat or  drink. You feel nauseous. You vomit. You have diarrhea. Get help right away if: Your blood glucose is lower than 54 mg/dL (3 mmol/L). You get confused. You have trouble thinking clearly. You have trouble breathing. These symptoms may represent a serious problem that is an emergency. Do not wait to see if the symptoms will go away. Get medical help right away. Call your local emergency services (911 in the [place] Do not drive yourself to the hospital. Summary Diabetes mellitus is a chronic disease that occurs when the body does not properly use sugar (glucose) that is released from food after you eat. Take insulin and diabetes medicines as told. Check your blood glucose every day, as often as told. Keep all follow-up visits. This is important. This information is not intended to replace advice given to you by your health care provider. Make sure you discuss any questions you have with your health care provider. Document Revised: 06/17/2019 Document Reviewed: 06/17/2019 Elsevier Patient Education  2024 [employer]vier Inc.  

## 2022-10-31 ENCOUNTER — Other Ambulatory Visit: Payer: Self-pay | Admitting: Nurse Practitioner

## 2022-10-31 DIAGNOSIS — I10 Essential (primary) hypertension: Secondary | ICD-10-CM

## 2022-11-01 ENCOUNTER — Ambulatory Visit (INDEPENDENT_AMBULATORY_CARE_PROVIDER_SITE_OTHER): Payer: Medicare HMO | Admitting: Nurse Practitioner

## 2022-11-01 ENCOUNTER — Encounter: Payer: Self-pay | Admitting: Nurse Practitioner

## 2022-11-01 VITALS — BP 116/67 | HR 75 | Temp 97.5°F | Ht 69.5 in | Wt 234.6 lb

## 2022-11-01 DIAGNOSIS — Z23 Encounter for immunization: Secondary | ICD-10-CM

## 2022-11-01 DIAGNOSIS — E1159 Type 2 diabetes mellitus with other circulatory complications: Secondary | ICD-10-CM | POA: Diagnosis not present

## 2022-11-01 DIAGNOSIS — E6609 Other obesity due to excess calories: Secondary | ICD-10-CM

## 2022-11-01 DIAGNOSIS — Z6833 Body mass index (BMI) 33.0-33.9, adult: Secondary | ICD-10-CM

## 2022-11-01 DIAGNOSIS — N183 Chronic kidney disease, stage 3 unspecified: Secondary | ICD-10-CM

## 2022-11-01 DIAGNOSIS — N400 Enlarged prostate without lower urinary tract symptoms: Secondary | ICD-10-CM | POA: Diagnosis not present

## 2022-11-01 DIAGNOSIS — E1165 Type 2 diabetes mellitus with hyperglycemia: Secondary | ICD-10-CM

## 2022-11-01 DIAGNOSIS — E1122 Type 2 diabetes mellitus with diabetic chronic kidney disease: Secondary | ICD-10-CM | POA: Diagnosis not present

## 2022-11-01 DIAGNOSIS — I471 Supraventricular tachycardia, unspecified: Secondary | ICD-10-CM

## 2022-11-01 DIAGNOSIS — I152 Hypertension secondary to endocrine disorders: Secondary | ICD-10-CM

## 2022-11-01 DIAGNOSIS — Z794 Long term (current) use of insulin: Secondary | ICD-10-CM | POA: Diagnosis not present

## 2022-11-01 DIAGNOSIS — E1169 Type 2 diabetes mellitus with other specified complication: Secondary | ICD-10-CM | POA: Diagnosis not present

## 2022-11-01 DIAGNOSIS — E785 Hyperlipidemia, unspecified: Secondary | ICD-10-CM | POA: Diagnosis not present

## 2022-11-01 LAB — BAYER DCA HB A1C WAIVED: HB A1C (BAYER DCA - WAIVED): 8.2 % — ABNORMAL HIGH (ref 4.8–5.6)

## 2022-11-01 MED ORDER — EMPAGLIFLOZIN 25 MG PO TABS
25.0000 mg | ORAL_TABLET | Freq: Every day | ORAL | 4 refills | Status: DC
Start: 2022-11-01 — End: 2023-11-07

## 2022-11-01 MED ORDER — ATORVASTATIN CALCIUM 20 MG PO TABS
20.0000 mg | ORAL_TABLET | Freq: Every day | ORAL | 4 refills | Status: DC
Start: 2022-11-01 — End: 2023-11-07

## 2022-11-01 NOTE — Progress Notes (Signed)
BP 116/67   Pulse 75   Temp (!) 97.5 F (36.4 C) (Oral)   Ht 5' 9.5" (1.765 m)   Wt 234 lb 9.6 oz (106.4 kg)   SpO2 95%   BMI 34.15 kg/m    Subjective:    Patient ID: Matthew [name], male    DOB: 07/05/1945, 77 y.o.   MRN: [id number]  HPI: Matthew [name] is a 77 y.o. male  Chief Complaint  Patient presents with   Diabetes    Pt states he has not had a recent eye exam   Hyperlipidemia   Hypertension   DIABETES A1c January 10%.  Currently taking 50 units of Lantus and Jardiance 25 MG daily + Metformin 1000 MG BID -- taking every day with no missed doses.  Tried Ozempic in past this caused allergic reaction, swelling.  Supplement continues for low B12.  Joined the [employer] -- walks 6 days a week 3 miles, lifting weights starting this week, has cut back on carbs.  Started all of this 8 weeks ago.   Hypoglycemic episodes:no Polydipsia/polyuria: no Visual disturbance: no Chest pain: no Paresthesias: no Glucose Monitoring: yes  Accucheck frequency: daily 81 to 160  Fasting glucose: 96 today and 91 yesterday  Post prandial:  Evening:  Before meals: Taking Insulin?: yes  Long acting insulin: Lantus 50 units  Short acting insulin: Blood Pressure Monitoring: not checking Retinal Examination: Up to Date - [facility] [place] -- one year ago Foot Exam: Up to Date Diabetic Education: Completed Pneumovax: Up to Date Influenza: Up to Date Aspirin: yes  HYPERTENSION / HYPERLIPIDEMIA Continues on Benazepril and Atorvastatin. Satisfied with current treatment? yes Duration of hypertension: chronic BP monitoring frequency: not checking BP range:  BP medication side effects: no Duration of hyperlipidemia: chronic Cholesterol medication side effects: no Cholesterol supplements: none Medication compliance: good compliance Aspirin: yes Recent stressors: no Recurrent headaches: no Visual changes: no Palpitations: no Dyspnea: no Chest pain: no Lower extremity edema:  no Dizzy/lightheaded: no    CHRONIC KIDNEY DISEASE (CKD 3a) CKD status: stable Medications renally dose: yes Previous renal evaluation: no Pneumovax:  Up to Date Influenza Vaccine:  Up to Date  Relevant past medical, surgical, family and social history reviewed and updated as indicated. Interim medical history since our last visit reviewed. Allergies and medications reviewed and updated.  Review of Systems  Constitutional:  Negative for activity change, diaphoresis, fatigue and fever.  Respiratory:  Negative for cough, chest tightness, shortness of breath and wheezing.   Cardiovascular:  Negative for chest pain, palpitations and leg swelling.  Endocrine: Negative for polydipsia, polyphagia and polyuria.  Neurological:  Negative for dizziness, syncope, weakness, light-headedness, numbness and headaches.  Psychiatric/Behavioral: Negative.     Per HPI unless specifically indicated above     Objective:    BP 116/67   Pulse 75   Temp (!) 97.5 F (36.4 C) (Oral)   Ht 5' 9.5" (1.765 m)   Wt 234 lb 9.6 oz (106.4 kg)   SpO2 95%   BMI 34.15 kg/m   Wt Readings from Last 3 Encounters:  11/01/22 234 lb 9.6 oz (106.4 kg)  03/27/22 240 lb 6.4 oz (109 kg)  02/24/22 239 lb (108.4 kg)    Physical Exam Vitals and nursing note reviewed.  Constitutional:      General: He is awake. He is not in acute distress.    Appearance: He is well-developed and well-groomed. He is obese. He is not ill-appearing or toxic-appearing.  HENT:  Head: Normocephalic.  Eyes:     General: Lids are normal.     Extraocular Movements: Extraocular movements intact.     Conjunctiva/sclera: Conjunctivae normal.  Neck:     Thyroid: No thyromegaly.     Vascular: No carotid bruit.  Cardiovascular:     Rate and Rhythm: Normal rate and regular rhythm.     Heart sounds: Normal heart sounds.  Pulmonary:     Effort: No accessory muscle usage or respiratory distress.     Breath sounds: Normal breath sounds.   Abdominal:     General: Bowel sounds are normal. There is no distension.     Palpations: Abdomen is soft.     Tenderness: There is no abdominal tenderness.  Musculoskeletal:     Cervical back: Full passive range of motion without pain.     Right lower leg: No edema.     Left lower leg: No edema.  Lymphadenopathy:     Cervical: No cervical adenopathy.  Skin:    General: Skin is warm.     Capillary Refill: Capillary refill takes less than 2 seconds.  Neurological:     Mental Status: He is alert and oriented to person, place, and time.     Deep Tendon Reflexes: Reflexes are normal and symmetric.     Reflex Scores:      Brachioradialis reflexes are 2+ on the right side and 2+ on the left side.      Patellar reflexes are 2+ on the right side and 2+ on the left side. Psychiatric:        Attention and Perception: Attention normal.        Mood and Affect: Mood normal.        Speech: Speech normal.        Behavior: Behavior normal. Behavior is cooperative.        Thought Content: Thought content normal.    Results for orders placed or performed in visit on 03/27/22  Bayer DCA Hb A1c Waived  Result Value Ref Range   HB A1C (BAYER DCA - WAIVED) 10.0 (H) 4.8 - 5.6 %  Microalbumin, Urine Waived  Result Value Ref Range   Microalb, Ur Waived 10 0 - 19 mg/L   Creatinine, Urine Waived 50 10 - 300 mg/dL   Microalb/Creat Ratio 30-300 (H) <30 mg/g  Comprehensive metabolic panel  Result Value Ref Range   Glucose 246 (H) 70 - 99 mg/dL   BUN 28 (H) 8 - 27 mg/dL   Creatinine, Ser 1.61 (H) 0.76 - 1.27 mg/dL   eGFR 53 (L) >09 UE/AVW/0.98   BUN/Creatinine Ratio 20 10 - 24   Sodium 136 134 - 144 mmol/L   Potassium 4.4 3.5 - 5.2 mmol/L   Chloride 99 96 - 106 mmol/L   CO2 20 20 - 29 mmol/L   Calcium 9.3 8.6 - 10.2 mg/dL   Total Protein 6.6 6.0 - 8.5 g/dL   Albumin 4.4 3.8 - 4.8 g/dL   Globulin, Total 2.2 1.5 - 4.5 g/dL   Albumin/Globulin Ratio 2.0 1.2 - 2.2   Bilirubin Total 0.3 0.0 - 1.2  mg/dL   Alkaline Phosphatase 87 44 - 121 IU/L   AST 29 0 - 40 IU/L   ALT 35 0 - 44 IU/L  Lipid Panel w/o Chol/HDL Ratio  Result Value Ref Range   Cholesterol, Total 149 100 - 199 mg/dL   Triglycerides 119 (H) 0 - 149 mg/dL   HDL 40 >14 mg/dL   VLDL Cholesterol Cal  33 5 - 40 mg/dL   LDL Chol Calc (NIH) 76 0 - 99 mg/dL  Vitamin U04  Result Value Ref Range   Vitamin B-12 502 232 - 1,245 pg/mL      Assessment & Plan:   Problem List Items Addressed This Visit       Cardiovascular and Mediastinum   Hypertension associated with diabetes (HCC)    Chronic, ongoing.  BP well at goal today.  Recommend he monitor BP at least a few mornings a week at home and document.  DASH diet at home.  Continue current medication regimen and adjust as needed.  Labs today: CBC, CMP, TSH.  Urine ALB 08 March 2022.  Benazepril for kidney protection in diabetes.       Relevant Medications   atorvastatin (LIPITOR) 20 MG tablet   empagliflozin (JARDIANCE) 25 MG TABS tablet   Other Relevant Orders   Bayer DCA Hb A1c Waived   CBC with Differential/Platelet   Comprehensive metabolic panel   TSH     Endocrine   CKD stage 3 due to type 2 diabetes mellitus (HCC)    Chronic, ongoing.  CKD 3a on labs, remaining stable.  Recheck CMP today and refer to nephrology as needed for worsening.  Urine ALB 08 March 2022.      Relevant Medications   atorvastatin (LIPITOR) 20 MG tablet   empagliflozin (JARDIANCE) 25 MG TABS tablet   Other Relevant Orders   Bayer DCA Hb A1c Waived   CBC with Differential/Platelet   Comprehensive metabolic panel   Hyperlipidemia associated with type 2 diabetes mellitus (HCC)    Chronic, ongoing.  Continue current medication regimen and adjust as needed.  Lipid panel today.      Relevant Medications   atorvastatin (LIPITOR) 20 MG tablet   empagliflozin (JARDIANCE) 25 MG TABS tablet   Other Relevant Orders   Bayer DCA Hb A1c Waived   Comprehensive metabolic panel   Lipid Panel  w/o Chol/HDL Ratio   Type 2 diabetes mellitus with hyperglycemia (HCC) - Primary    Chronic, ongoing.  A1c 8.2%, he has been working hard at diet changes and exercise over past 2 months with sugars at home trending down. Urine ALB 08 March 2022.   - Do to lifestyle changes and sugars coming down will maintain Lantus at 50 units.  Continue Jardiance and Metformin at max doses.  Can not take GLP1.  Could consider trial of Januvia or meal insulin if needed in future.  Discussed with him need to send to endocrinology if ongoing poor control in future. - Will need to renal dose Metformin if eGFR <45, monitor closely, discussed with patient.   - Did not tolerate Ozempic in past.   - Recommend continue to check BS 2-3 times daily and document for visits.   - Eye exam up to date will attempt to get records.  Foot exam up to date. - Vaccinations up to date. - ACE and Statin on board. - Return in 3 months, highly recommend he maintain regular visits.      Relevant Medications   atorvastatin (LIPITOR) 20 MG tablet   empagliflozin (JARDIANCE) 25 MG TABS tablet   Other Relevant Orders   Bayer DCA Hb A1c Waived     Genitourinary   BPH (benign prostatic hyperplasia)    Check PSA on labs today.  No symptoms.      Relevant Orders   PSA     Other   Obesity    BMI 34.15,  has lost 7 pounds.  Recommended eating smaller high protein, low fat meals more frequently and exercising 30 mins a day 5 times a week with a goal of 10-15lb weight loss in the next 3 months. Patient voiced their understanding and motivation to adhere to these recommendations.       Relevant Medications   empagliflozin (JARDIANCE) 25 MG TABS tablet   Other Visit Diagnoses     Flu vaccine need       Flu vaccine in office today, educated patient.   Relevant Orders   Flu Vaccine Trivalent High Dose (Fluad) (Completed)        Follow up plan: Return in about 3 months (around 01/31/2023).

## 2022-11-01 NOTE — Assessment & Plan Note (Signed)
Check PSA on labs today.  No symptoms.

## 2022-11-01 NOTE — Assessment & Plan Note (Signed)
Chronic, ongoing.  BP well at goal today.  Recommend he monitor BP at least a few mornings a week at home and document.  DASH diet at home.  Continue current medication regimen and adjust as needed.  Labs today: CBC, CMP, TSH.  Urine ALB 08 March 2022.  Benazepril for kidney protection in diabetes.

## 2022-11-01 NOTE — Assessment & Plan Note (Signed)
Chronic, ongoing.  CKD 3a on labs, remaining stable.  Recheck CMP today and refer to nephrology as needed for worsening.  Urine ALB 08 March 2022.

## 2022-11-01 NOTE — Assessment & Plan Note (Signed)
Chronic, ongoing.  Continue current medication regimen and adjust as needed. Lipid panel today. 

## 2022-11-01 NOTE — Assessment & Plan Note (Signed)
Chronic, ongoing.  A1c 8.2%, he has been working hard at diet changes and exercise over past 2 months with sugars at home trending down. Urine ALB 08 March 2022.   - Do to lifestyle changes and sugars coming down will maintain Lantus at 50 units.  Continue Jardiance and Metformin at max doses.  Can not take GLP1.  Could consider trial of Januvia or meal insulin if needed in future.  Discussed with him need to send to endocrinology if ongoing poor control in future. - Will need to renal dose Metformin if eGFR <45, monitor closely, discussed with patient.   - Did not tolerate Ozempic in past.   - Recommend continue to check BS 2-3 times daily and document for visits.   - Eye exam up to date will attempt to get records.  Foot exam up to date. - Vaccinations up to date. - ACE and Statin on board. - Return in 3 months, highly recommend he maintain regular visits.

## 2022-11-01 NOTE — Assessment & Plan Note (Signed)
BMI 34.15, has lost 7 pounds.  Recommended eating smaller high protein, low fat meals more frequently and exercising 30 mins a day 5 times a week with a goal of 10-15lb weight loss in the next 3 months. Patient voiced their understanding and motivation to adhere to these recommendations.

## 2022-11-01 NOTE — Telephone Encounter (Signed)
Patient walked in requesting refill for benazepril 40mg  to be sent to  [facility]. I am forwarding message to CMA. Patient has an appointment this afternoon at 2:20pm.

## 2022-11-02 LAB — CBC WITH DIFFERENTIAL/PLATELET
Basophils Absolute: 0.1 10*3/uL (ref 0.0–0.2)
Basos: 1 %
EOS (ABSOLUTE): 0.1 10*3/uL (ref 0.0–0.4)
Eos: 1 %
Hematocrit: 45.4 % (ref 37.5–51.0)
Hemoglobin: 14.7 g/dL (ref 13.0–17.7)
Immature Grans (Abs): 0 10*3/uL (ref 0.0–0.1)
Immature Granulocytes: 0 %
Lymphocytes Absolute: 2.1 10*3/uL (ref 0.7–3.1)
Lymphs: 26 %
MCH: 29.1 pg (ref 26.6–33.0)
MCHC: 32.4 g/dL (ref 31.5–35.7)
MCV: 90 fL (ref 79–97)
Monocytes Absolute: 0.9 10*3/uL (ref 0.1–0.9)
Monocytes: 10 %
Neutrophils Absolute: 5.2 10*3/uL (ref 1.4–7.0)
Neutrophils: 62 %
Platelets: 251 10*3/uL (ref 150–450)
RBC: 5.06 x10E6/uL (ref 4.14–5.80)
RDW: 12.9 % (ref 11.6–15.4)
WBC: 8.4 10*3/uL (ref 3.4–10.8)

## 2022-11-02 LAB — COMPREHENSIVE METABOLIC PANEL
ALT: 28 IU/L (ref 0–44)
AST: 25 IU/L (ref 0–40)
Albumin: 4.6 g/dL (ref 3.8–4.8)
Alkaline Phosphatase: 70 IU/L (ref 44–121)
BUN/Creatinine Ratio: 18 (ref 10–24)
BUN: 27 mg/dL (ref 8–27)
Bilirubin Total: 0.3 mg/dL (ref 0.0–1.2)
CO2: 21 mmol/L (ref 20–29)
Calcium: 9.6 mg/dL (ref 8.6–10.2)
Chloride: 101 mmol/L (ref 96–106)
Creatinine, Ser: 1.46 mg/dL — ABNORMAL HIGH (ref 0.76–1.27)
Globulin, Total: 2.2 g/dL (ref 1.5–4.5)
Glucose: 76 mg/dL (ref 70–99)
Potassium: 4.5 mmol/L (ref 3.5–5.2)
Sodium: 137 mmol/L (ref 134–144)
Total Protein: 6.8 g/dL (ref 6.0–8.5)
eGFR: 50 mL/min/{1.73_m2} — ABNORMAL LOW (ref >59)

## 2022-11-02 LAB — LIPID PANEL W/O CHOL/HDL RATIO
Cholesterol, Total: 137 mg/dL (ref 100–199)
HDL: 49 mg/dL (ref >39)
LDL Chol Calc (NIH): 73 mg/dL (ref 0–99)
Triglycerides: 79 mg/dL (ref 0–149)
VLDL Cholesterol Cal: 15 mg/dL (ref 5–40)

## 2022-11-02 LAB — PSA: Prostate Specific Ag, Serum: 0.2 ng/mL (ref 0.0–4.0)

## 2022-11-02 LAB — TSH: TSH: 2.22 u[IU]/mL (ref 0.450–4.500)

## 2022-11-02 NOTE — Progress Notes (Signed)
Good afternoon, please let Matthew [name] know his labs have returned and they remain stable with ongoing chronic kidney disease Stage 3a with no worsening.  Good news!!  Any questions? Keep being awesome!!  Thank you for allowing me to participate in your care.  I appreciate you. Kindest regards, Kaimana Lurz

## 2022-11-18 DIAGNOSIS — I4891 Unspecified atrial fibrillation: Secondary | ICD-10-CM | POA: Diagnosis not present

## 2022-11-18 DIAGNOSIS — R32 Unspecified urinary incontinence: Secondary | ICD-10-CM | POA: Diagnosis not present

## 2022-11-18 DIAGNOSIS — J45909 Unspecified asthma, uncomplicated: Secondary | ICD-10-CM | POA: Diagnosis not present

## 2022-11-18 DIAGNOSIS — M1990 Unspecified osteoarthritis, unspecified site: Secondary | ICD-10-CM | POA: Diagnosis not present

## 2022-11-18 DIAGNOSIS — N400 Enlarged prostate without lower urinary tract symptoms: Secondary | ICD-10-CM | POA: Diagnosis not present

## 2022-11-18 DIAGNOSIS — I872 Venous insufficiency (chronic) (peripheral): Secondary | ICD-10-CM | POA: Diagnosis not present

## 2022-11-18 DIAGNOSIS — Z008 Encounter for other general examination: Secondary | ICD-10-CM | POA: Diagnosis not present

## 2022-11-18 DIAGNOSIS — I471 Supraventricular tachycardia, unspecified: Secondary | ICD-10-CM | POA: Diagnosis not present

## 2022-11-18 DIAGNOSIS — E785 Hyperlipidemia, unspecified: Secondary | ICD-10-CM | POA: Diagnosis not present

## 2022-11-18 DIAGNOSIS — D6869 Other thrombophilia: Secondary | ICD-10-CM | POA: Diagnosis not present

## 2022-11-18 DIAGNOSIS — E1122 Type 2 diabetes mellitus with diabetic chronic kidney disease: Secondary | ICD-10-CM | POA: Diagnosis not present

## 2022-11-18 DIAGNOSIS — Z794 Long term (current) use of insulin: Secondary | ICD-10-CM | POA: Diagnosis not present

## 2023-01-27 NOTE — Patient Instructions (Signed)

## 2023-01-30 ENCOUNTER — Telehealth: Payer: Self-pay | Admitting: Nurse Practitioner

## 2023-01-30 NOTE — Telephone Encounter (Signed)
Copied from CRM [id number]. Topic: Medicare AWV >> Jan 30, 2023  1:27 PM Payton Doughty wrote: Reason for CRM: Called LVM 01/30/2023 to schedule Annual Wellness Visit  Verlee Rossetti; Care Guide Ambulatory Clinical Support [place] [facility] Direct Dial: [phone]

## 2023-01-31 ENCOUNTER — Encounter: Payer: Self-pay | Admitting: Nurse Practitioner

## 2023-01-31 ENCOUNTER — Ambulatory Visit (INDEPENDENT_AMBULATORY_CARE_PROVIDER_SITE_OTHER): Payer: Medicare HMO | Admitting: Nurse Practitioner

## 2023-01-31 VITALS — BP 126/64 | HR 74 | Temp 97.5°F | Ht 69.5 in | Wt 242.4 lb

## 2023-01-31 DIAGNOSIS — E66811 Obesity, class 1: Secondary | ICD-10-CM

## 2023-01-31 DIAGNOSIS — E538 Deficiency of other specified B group vitamins: Secondary | ICD-10-CM

## 2023-01-31 DIAGNOSIS — E1159 Type 2 diabetes mellitus with other circulatory complications: Secondary | ICD-10-CM

## 2023-01-31 DIAGNOSIS — N183 Chronic kidney disease, stage 3 unspecified: Secondary | ICD-10-CM

## 2023-01-31 DIAGNOSIS — E785 Hyperlipidemia, unspecified: Secondary | ICD-10-CM

## 2023-01-31 DIAGNOSIS — E6609 Other obesity due to excess calories: Secondary | ICD-10-CM | POA: Diagnosis not present

## 2023-01-31 DIAGNOSIS — I471 Supraventricular tachycardia, unspecified: Secondary | ICD-10-CM

## 2023-01-31 DIAGNOSIS — E1169 Type 2 diabetes mellitus with other specified complication: Secondary | ICD-10-CM

## 2023-01-31 DIAGNOSIS — E1165 Type 2 diabetes mellitus with hyperglycemia: Secondary | ICD-10-CM | POA: Diagnosis not present

## 2023-01-31 DIAGNOSIS — Z794 Long term (current) use of insulin: Secondary | ICD-10-CM

## 2023-01-31 DIAGNOSIS — I152 Hypertension secondary to endocrine disorders: Secondary | ICD-10-CM | POA: Diagnosis not present

## 2023-01-31 DIAGNOSIS — E1122 Type 2 diabetes mellitus with diabetic chronic kidney disease: Secondary | ICD-10-CM

## 2023-01-31 LAB — BAYER DCA HB A1C WAIVED: HB A1C (BAYER DCA - WAIVED): 7.4 % — ABNORMAL HIGH (ref 4.8–5.6)

## 2023-01-31 NOTE — Assessment & Plan Note (Signed)
Chronic, ongoing.  Recommend continue supplement daily.  Recheck level next visit.

## 2023-01-31 NOTE — Progress Notes (Signed)
BP 126/64   Pulse 74   Temp (!) 97.5 F (36.4 C) (Oral)   Ht 5' 9.5" (1.765 m)   Wt 242 lb 6.4 oz (110 kg)   SpO2 97%   BMI 35.28 kg/m    Subjective:    Patient ID: Matthew [name], male    DOB: Mar 18, 1945, 77 y.o.   MRN: [id number]  HPI: Matthew [name] is a 77 y.o. male  Chief Complaint  Patient presents with   Diabetes    No recent eye exam per patient   Hyperlipidemia   Hypertension   DIABETES A1c in September was 8.4%.  Currently taking 50 units of Lantus and Jardiance 25 MG daily + Metformin 1000 MG BID.  He is concerned about Jardiance cost in new year and would like to come off this, reports he would not qualify for assistance. Januvia is tier 3 too. He reports Tier 3 drugs will be reduced in 2026.  Ozempic in past caused allergic reaction, swelling.  Supplement continues for low B12.  Joined the [employer] --  walks 3 1/2 miles three days a week, lifting heavier weights 6 days a week.  Started all of this > 3 months ago. Hypoglycemic episodes:no Polydipsia/polyuria: no Visual disturbance: no Chest pain: no Paresthesias: no Glucose Monitoring: yes  Accucheck frequency: daily 88 to 135  Fasting glucose: 80 to 135 -- occasional 200 range which he corrected the next day -- average 110 to 115  Post prandial:  Evening:  Before meals: Taking Insulin?: yes  Long acting insulin: Lantus 50 units  Short acting insulin: Blood Pressure Monitoring: not checking Retinal Examination: Not Up To Date Foot Exam: Up to Date Diabetic Education: Completed Pneumovax: Up to Date Influenza: Up to Date Aspirin: yes  HYPERTENSION / HYPERLIPIDEMIA Continues on Benazepril and Atorvastatin. Satisfied with current treatment? yes Duration of hypertension: chronic BP monitoring frequency: not checking BP range:  BP medication side effects: no Duration of hyperlipidemia: chronic Cholesterol medication side effects: no Cholesterol supplements: none Medication compliance: good  compliance Aspirin: yes Recent stressors: no Recurrent headaches: no Visual changes: no Palpitations: no -- has had none in several months Dyspnea: no Chest pain: no Lower extremity edema: no Dizzy/lightheaded: no    CHRONIC KIDNEY DISEASE (CKD 3a) CKD status: stable Medications renally dose: yes Previous renal evaluation: no Pneumovax:  Up to Date Influenza Vaccine:  Up to Date  Relevant past medical, surgical, family and social history reviewed and updated as indicated. Interim medical history since our last visit reviewed. Allergies and medications reviewed and updated.  Review of Systems  Constitutional:  Negative for activity change, diaphoresis, fatigue and fever.  Respiratory:  Negative for cough, chest tightness, shortness of breath and wheezing.   Cardiovascular:  Negative for chest pain, palpitations and leg swelling.  Endocrine: Negative for polydipsia, polyphagia and polyuria.  Neurological:  Negative for dizziness, syncope, weakness, light-headedness, numbness and headaches.  Psychiatric/Behavioral: Negative.     Per HPI unless specifically indicated above     Objective:    BP 126/64   Pulse 74   Temp (!) 97.5 F (36.4 C) (Oral)   Ht 5' 9.5" (1.765 m)   Wt 242 lb 6.4 oz (110 kg)   SpO2 97%   BMI 35.28 kg/m   Wt Readings from Last 3 Encounters:  01/31/23 242 lb 6.4 oz (110 kg)  11/01/22 234 lb 9.6 oz (106.4 kg)  03/27/22 240 lb 6.4 oz (109 kg)    Physical Exam Vitals and nursing  note reviewed.  Constitutional:      General: He is awake. He is not in acute distress.    Appearance: He is well-developed and well-groomed. He is obese. He is not ill-appearing or toxic-appearing.  HENT:     Head: Normocephalic.  Eyes:     General: Lids are normal.     Extraocular Movements: Extraocular movements intact.     Conjunctiva/sclera: Conjunctivae normal.  Neck:     Thyroid: No thyromegaly.     Vascular: No carotid bruit.  Cardiovascular:     Rate and  Rhythm: Normal rate and regular rhythm.     Heart sounds: Normal heart sounds.  Pulmonary:     Effort: No accessory muscle usage or respiratory distress.     Breath sounds: Normal breath sounds.  Abdominal:     General: Bowel sounds are normal. There is no distension.     Palpations: Abdomen is soft.     Tenderness: There is no abdominal tenderness.  Musculoskeletal:     Cervical back: Full passive range of motion without pain.     Right lower leg: No edema.     Left lower leg: No edema.  Lymphadenopathy:     Cervical: No cervical adenopathy.  Skin:    General: Skin is warm.     Capillary Refill: Capillary refill takes less than 2 seconds.  Neurological:     Mental Status: He is alert and oriented to person, place, and time.     Deep Tendon Reflexes: Reflexes are normal and symmetric.     Reflex Scores:      Brachioradialis reflexes are 2+ on the right side and 2+ on the left side.      Patellar reflexes are 2+ on the right side and 2+ on the left side. Psychiatric:        Attention and Perception: Attention normal.        Mood and Affect: Mood normal.        Speech: Speech normal.        Behavior: Behavior normal. Behavior is cooperative.        Thought Content: Thought content normal.    Results for orders placed or performed in visit on 11/01/22  Bayer DCA Hb A1c Waived  Result Value Ref Range   HB A1C (BAYER DCA - WAIVED) 8.2 (H) 4.8 - 5.6 %  CBC with Differential/Platelet  Result Value Ref Range   WBC 8.4 3.4 - 10.8 x10E3/uL   RBC 5.06 4.14 - 5.80 x10E6/uL   Hemoglobin 14.7 13.0 - 17.7 g/dL   Hematocrit 01.0 27.2 - 51.0 %   MCV 90 79 - 97 fL   MCH 29.1 26.6 - 33.0 pg   MCHC 32.4 31.5 - 35.7 g/dL   RDW 53.6 64.4 - 03.4 %   Platelets 251 150 - 450 x10E3/uL   Neutrophils 62 Not Estab. %   Lymphs 26 Not Estab. %   Monocytes 10 Not Estab. %   Eos 1 Not Estab. %   Basos 1 Not Estab. %   Neutrophils Absolute 5.2 1.4 - 7.0 x10E3/uL   Lymphocytes Absolute 2.1 0.7 -  3.1 x10E3/uL   Monocytes Absolute 0.9 0.1 - 0.9 x10E3/uL   EOS (ABSOLUTE) 0.1 0.0 - 0.4 x10E3/uL   Basophils Absolute 0.1 0.0 - 0.2 x10E3/uL   Immature Granulocytes 0 Not Estab. %   Immature Grans (Abs) 0.0 0.0 - 0.1 x10E3/uL  Comprehensive metabolic panel  Result Value Ref Range   Glucose 76 70 -  99 mg/dL   BUN 27 8 - 27 mg/dL   Creatinine, Ser 6.29 (H) 0.76 - 1.27 mg/dL   eGFR 50 (L) >52 WU/XLK/4.40   BUN/Creatinine Ratio 18 10 - 24   Sodium 137 134 - 144 mmol/L   Potassium 4.5 3.5 - 5.2 mmol/L   Chloride 101 96 - 106 mmol/L   CO2 21 20 - 29 mmol/L   Calcium 9.6 8.6 - 10.2 mg/dL   Total Protein 6.8 6.0 - 8.5 g/dL   Albumin 4.6 3.8 - 4.8 g/dL   Globulin, Total 2.2 1.5 - 4.5 g/dL   Bilirubin Total 0.3 0.0 - 1.2 mg/dL   Alkaline Phosphatase 70 44 - 121 IU/L   AST 25 0 - 40 IU/L   ALT 28 0 - 44 IU/L  Lipid Panel w/o Chol/HDL Ratio  Result Value Ref Range   Cholesterol, Total 137 100 - 199 mg/dL   Triglycerides 79 0 - 149 mg/dL   HDL 49 >10 mg/dL   VLDL Cholesterol Cal 15 5 - 40 mg/dL   LDL Chol Calc (NIH) 73 0 - 99 mg/dL  TSH  Result Value Ref Range   TSH 2.220 0.450 - 4.500 uIU/mL  PSA  Result Value Ref Range   Prostate Specific Ag, Serum 0.2 0.0 - 4.0 ng/mL      Assessment & Plan:   Problem List Items Addressed This Visit       Cardiovascular and Mediastinum   Hypertension associated with diabetes (HCC)    Chronic, ongoing.  BP well at goal today.  Recommend he monitor BP at least a few mornings a week at home and document.  DASH diet at home.  Continue current medication regimen and adjust as needed.  Labs today: CMP.  Urine ALB 08 March 2022.  Benazepril for kidney protection in diabetes.       Relevant Orders   Bayer DCA Hb A1c Waived   Comprehensive metabolic panel   SVT (supraventricular tachycardia) (HCC)    Stable with no current BB, took Metoprolol in past, SVT was suspected to be related to infection at the time.        Endocrine   CKD stage 3  due to type 2 diabetes mellitus (HCC)    Chronic, ongoing.  CKD 3a on labs, remaining stable.  Recheck CMP today and refer to nephrology as needed for worsening.  Urine ALB 08 March 2022.      Relevant Orders   Bayer DCA Hb A1c Waived   Comprehensive metabolic panel   Hyperlipidemia associated with type 2 diabetes mellitus (HCC)    Chronic, ongoing.  Continue current medication regimen and adjust as needed.  Lipid panel today.      Relevant Orders   Bayer DCA Hb A1c Waived   Comprehensive metabolic panel   Lipid Panel w/o Chol/HDL Ratio   Type 2 diabetes mellitus with hyperglycemia (HCC) - Primary    Chronic, ongoing.  A1c 7.4%, he has been working hard at diet changes and exercise over past 2 months with sugars at home trending down.  Is concerned about Jardiance cost in new year and would like to come off of this for one year until covered in 2026. Urine ALB 08 March 2022.   - Due to lifestyle changes and sugars coming down will maintain Lantus at 50 units.  Continue Metformin at max dose, wants to come off Jardiance.  Can not take GLP1.  Could consider trial of Januvia or meal insulin if needed in future,  but is a Tier 3 too.  Last option would be Glipizide, which would prefer not to use -- could increase insulin if levels increase. - Will need to renal dose Metformin if eGFR <45, monitor closely, discussed with patient.   - Did not tolerate Ozempic in past.   - Recommend continue to check BS 2-3 times daily and document for visits.   - Eye exam needed.  Foot exam up to date. - Vaccinations up to date. - ACE and Statin on board. - Return in 3 months, highly recommend he maintain regular visits.      Relevant Orders   Bayer DCA Hb A1c Waived     Other   B12 deficiency    Chronic, ongoing.  Recommend continue supplement daily.  Recheck level next visit.      Obesity    BMI 35.28.  Recommended eating smaller high protein, low fat meals more frequently and exercising 30 mins a  day 5 times a week with a goal of 10-15lb weight loss in the next 3 months. Patient voiced their understanding and motivation to adhere to these recommendations.         Follow up plan: Return in about 3 months (around 05/01/2023) for T2DM, HTN/HLD + needs med wellness scheduled after 02/07/23.

## 2023-01-31 NOTE — Assessment & Plan Note (Signed)
Stable with no current BB, took Metoprolol in past, SVT was suspected to be related to infection at the time.

## 2023-01-31 NOTE — Assessment & Plan Note (Signed)
Chronic, ongoing.  Continue current medication regimen and adjust as needed. Lipid panel today. 

## 2023-01-31 NOTE — Assessment & Plan Note (Signed)
Chronic, ongoing.  A1c 7.4%, he has been working hard at diet changes and exercise over past 2 months with sugars at home trending down.  Is concerned about Jardiance cost in new year and would like to come off of this for one year until covered in 2026. Urine ALB 08 March 2022.   - Due to lifestyle changes and sugars coming down will maintain Lantus at 50 units.  Continue Metformin at max dose, wants to come off Jardiance.  Can not take GLP1.  Could consider trial of Januvia or meal insulin if needed in future, but is a Tier 3 too.  Last option would be Glipizide, which would prefer not to use -- could increase insulin if levels increase. - Will need to renal dose Metformin if eGFR <45, monitor closely, discussed with patient.   - Did not tolerate Ozempic in past.   - Recommend continue to check BS 2-3 times daily and document for visits.   - Eye exam needed.  Foot exam up to date. - Vaccinations up to date. - ACE and Statin on board. - Return in 3 months, highly recommend he maintain regular visits.

## 2023-01-31 NOTE — Assessment & Plan Note (Signed)
Chronic, ongoing.  CKD 3a on labs, remaining stable.  Recheck CMP today and refer to nephrology as needed for worsening.  Urine ALB 08 March 2022.

## 2023-01-31 NOTE — Assessment & Plan Note (Signed)
BMI 35.28.  Recommended eating smaller high protein, low fat meals more frequently and exercising 30 mins a day 5 times a week with a goal of 10-15lb weight loss in the next 3 months. Patient voiced their understanding and motivation to adhere to these recommendations.

## 2023-01-31 NOTE — Assessment & Plan Note (Signed)
Chronic, ongoing.  BP well at goal today.  Recommend he monitor BP at least a few mornings a week at home and document.  DASH diet at home.  Continue current medication regimen and adjust as needed.  Labs today: CMP.  Urine ALB 08 March 2022.  Benazepril for kidney protection in diabetes.

## 2023-02-01 LAB — COMPREHENSIVE METABOLIC PANEL
ALT: 27 [IU]/L (ref 0–44)
AST: 28 [IU]/L (ref 0–40)
Albumin: 4.4 g/dL (ref 3.8–4.8)
Alkaline Phosphatase: 64 [IU]/L (ref 44–121)
BUN/Creatinine Ratio: 17 (ref 10–24)
BUN: 22 mg/dL (ref 8–27)
Bilirubin Total: 0.3 mg/dL (ref 0.0–1.2)
CO2: 21 mmol/L (ref 20–29)
Calcium: 9.1 mg/dL (ref 8.6–10.2)
Chloride: 102 mmol/L (ref 96–106)
Creatinine, Ser: 1.31 mg/dL — ABNORMAL HIGH (ref 0.76–1.27)
Globulin, Total: 2.3 g/dL (ref 1.5–4.5)
Glucose: 102 mg/dL — ABNORMAL HIGH (ref 70–99)
Potassium: 4.6 mmol/L (ref 3.5–5.2)
Sodium: 137 mmol/L (ref 134–144)
Total Protein: 6.7 g/dL (ref 6.0–8.5)
eGFR: 57 mL/min/{1.73_m2} — ABNORMAL LOW (ref >59)

## 2023-02-01 LAB — LIPID PANEL W/O CHOL/HDL RATIO
Cholesterol, Total: 162 mg/dL (ref 100–199)
HDL: 52 mg/dL (ref >39)
LDL Chol Calc (NIH): 91 mg/dL (ref 0–99)
Triglycerides: 105 mg/dL (ref 0–149)
VLDL Cholesterol Cal: 19 mg/dL (ref 5–40)

## 2023-02-01 NOTE — Progress Notes (Signed)
Good morning, please let Matthew [name] know his labs have returned:  - Kidney function continues to show chronic kidney disease Stage 3a with no worsening.  We will continue to work on good diabetes control.  Continue Jardiance and Benazepril which are kidney protective. - Lipid panel shows LDL, bad cholesterol, above goal of <70.  Last visits you were in the 70's. Were you fasting this visit?  Let me know as if you were I would like to increase Atorvastatin to 40 MG daily.  Any questions? Keep being amazing!!  Thank you for allowing me to participate in your care.  I appreciate you. Kindest regards, Chaylee Ehrsam

## 2023-02-26 ENCOUNTER — Ambulatory Visit: Payer: Medicare HMO | Admitting: Emergency Medicine

## 2023-02-26 VITALS — Ht 72.0 in | Wt 224.0 lb

## 2023-02-26 DIAGNOSIS — Z0000 Encounter for general adult medical examination without abnormal findings: Secondary | ICD-10-CM | POA: Diagnosis not present

## 2023-02-26 NOTE — Patient Instructions (Addendum)
Matthew [name] , Thank you for taking time to come for your Medicare Wellness Visit. I appreciate your ongoing commitment to your health goals. Please review the following plan we discussed and let me know if I can assist you in the future.   Referrals/Orders/Follow-Ups/Clinician Recommendations: Get a diabetic eye exam every year.  This is a list of the screening recommended for you and due dates:  Health Maintenance  Topic Date Due   Eye exam for diabetics  07/21/2021   Yearly kidney health urinalysis for diabetes  03/28/2023   Hemoglobin A1C  08/01/2023   Complete foot exam   11/01/2023   Yearly kidney function blood test for diabetes  01/31/2024   Medicare Annual Wellness Visit  02/26/2024   DTaP/Tdap/Td vaccine (3 - Td or Tdap) 12/15/2031   Pneumonia Vaccine  Completed   Flu Shot  Completed   COVID-19 Vaccine  Completed   Hepatitis C Screening  Completed   Zoster (Shingles) Vaccine  Completed   HPV Vaccine  Aged Out   Colon Cancer Screening  Discontinued    Advanced directives: (Provided) Advance directive discussed with you today. I have provided a copy for you to complete at home and have notarized. Once this is complete, please bring a copy in to our office so we can scan it into your chart.   Next Medicare Annual Wellness Visit scheduled for next year: Yes, 03/04/24 @ 11:20am

## 2023-02-26 NOTE — Progress Notes (Signed)
Subjective:   Matthew [name] is a 77 y.o. male who presents for Medicare Annual/Subsequent preventive examination.  Visit Complete: Virtual I connected with  Matthew [name] on 02/26/23 by a audio enabled telemedicine application and verified that I am speaking with the correct person using two identifiers.  Patient Location: Home  Provider Location: Office/Clinic  I discussed the limitations of evaluation and management by telemedicine. The patient expressed understanding and agreed to proceed.  Vital Signs: Because this visit was a virtual/telehealth visit, some criteria may be missing or patient reported. Any vitals not documented were not able to be obtained and vitals that have been documented are patient reported.   Cardiac Risk Factors include: advanced age (>33men, >59 women);male gender;diabetes mellitus;dyslipidemia;hypertension;obesity (BMI >30kg/m2)     Objective:    Today's Vitals   02/26/23 1247  Weight: 224 lb (101.6 kg)  Height: 6' (1.829 m)   Body mass index is 30.38 kg/m.     02/26/2023    1:00 PM 02/06/2022   12:38 PM 04/15/2021    7:39 AM 03/29/2021   11:20 AM 03/17/2021   10:00 PM 03/17/2021    4:10 PM 01/17/2021    8:22 AM  Advanced Directives  Does Patient Have a Medical Advance Directive? No Yes Yes Yes Yes No Yes  Type of Advance Directive  Healthcare Power of Attorney Living will Living will Living will  Healthcare Power of Attorney  Does patient want to make changes to medical advance directive?   No - Patient declined  No - Patient declined    Copy of Healthcare Power of Attorney in Chart?  Yes - validated most recent copy scanned in chart (See row information)     Yes - validated most recent copy scanned in chart (See row information)  Would patient like information on creating a medical advance directive? Yes (MAU/Ambulatory/Procedural Areas - Information given)          Current Medications (verified) Outpatient Encounter Medications as of  02/26/2023  Medication Sig   aspirin EC 81 MG tablet Take 81 mg by mouth daily. Swallow whole.   atorvastatin (LIPITOR) 20 MG tablet Take 1 tablet (20 mg total) by mouth daily.   benazepril (LOTENSIN) 40 MG tablet Take 1 tablet (40 mg total) by mouth daily.   empagliflozin (JARDIANCE) 25 MG TABS tablet Take 1 tablet (25 mg total) by mouth daily.   insulin glargine (LANTUS) 100 UNIT/ML Solostar Pen Inject 50 Units into the skin at bedtime.   Insulin Pen Needle 31G X 8 MM MISC Use one needle to administer insulin into skin nightly.   metFORMIN (GLUCOPHAGE) 1000 MG tablet Take 1 tablet (1,000 mg total) by mouth 2 (two) times daily with a meal.   Multiple Vitamins-Minerals (CENTRUM SILVER 50+MEN) TABS Take 1 tablet by mouth daily.   vitamin B-12 (CYANOCOBALAMIN) 500 MCG tablet Take 500 mcg by mouth daily.   mupirocin ointment (BACTROBAN) 2 % Apply 1 Application topically 2 (two) times daily. (Patient not taking: Reported on 02/26/2023)   No facility-administered encounter medications on file as of 02/26/2023.    Allergies (verified) Ozempic (0.25 or 0.5 mg-dose) [semaglutide(0.25 or 0.5mg -dos)]   History: Past Medical History:  Diagnosis Date   Arthritis    bil knees and hips and back   Asthma    as a child   Hyperlipidemia    Hypertension    Sleep apnea    does not use cpap since losing weight   SVT (supraventricular tachycardia) (HCC) 03/17/2021  Type 2 diabetes mellitus (HCC)    Past Surgical History:  Procedure Laterality Date   COLONOSCOPY     CYSTOSCOPY W/ RETROGRADES Left 04/15/2021   Procedure: CYSTOSCOPY WITH RETROGRADE PYELOGRAM;  Surgeon: Sondra Come, MD;  Location: [facility] OR[facility];  Service: Urology;  Laterality: Left;   HERNIA REPAIR     inguinal   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 N/A 04/15/2021   Procedure: 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;  Surgeon: Sondra Come, MD;  Location: [facility];  Service: Urology;   Laterality: N/A;   KNEE ARTHROSCOPY Right 12/23/2020   Procedure: RIGHT ARTHROSCOPY KNEE PARTIAL MEDIAL AND LATERAL MENISCECTOMY;  Surgeon: Juanell Fairly, MD;  Location: [facility] OR[facility];  Service: Orthopedics;  Laterality: Right;   Family History  Problem Relation Age of Onset   Diabetes Mother    Hypertension Father    Diabetes Sister    Diabetes Brother    Diabetes Maternal Grandmother    Social History   Socioeconomic History   Marital status: Married    Spouse name: Kaye Rosal   Number of children: 4   Years of education: Not on file   Highest education level: Not on file  Occupational History   Occupation: retired  Tobacco Use   Smoking status: Never   Smokeless tobacco: Never  Vaping Use   Vaping status: Never Used  Substance and Sexual Activity   Alcohol use: No   Drug use: No   Sexual activity: Not Currently    Birth control/protection: None  Other Topics Concern   Not on file  Social History Narrative   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   Social Drivers of Corporate investment banker Strain: Low Risk  (02/26/2023)   Overall Financial Resource Strain (CARDIA)    Difficulty of Paying Living Expenses: Not hard at all  Food Insecurity: No Food Insecurity (02/26/2023)   Hunger Vital Sign    Worried About Running Out of Food in the Last Year: Never true    Ran Out of Food in the Last Year: Never true  Transportation Needs: No Transportation Needs (02/26/2023)   PRAPARE - Administrator, Civil Service (Medical): No    Lack of Transportation (Non-Medical): No  Physical Activity: Sufficiently Active (02/26/2023)   Exercise Vital Sign    Days of Exercise per Week: 6 days    Minutes of Exercise per Session: 60 min  Stress: No Stress Concern Present (02/26/2023)   [employer] of Occupational Health - Occupational Stress Questionnaire    Feeling of Stress : Not at all  Social Connections: Moderately Integrated  (02/26/2023)   Social Connection and Isolation Panel [NHANES]    Frequency of Communication with Friends and Family: Once a week    Frequency of Social Gatherings with Friends and Family: More than three times a week    Attends Religious Services: More than 4 times per year    Active Member of [employer] or Organizations: No    Attends Engineer, structural: Never    Marital Status: Married    Tobacco Counseling Counseling given: Not Answered   Clinical Intake:  Pre-visit preparation completed: Yes  Pain : No/denies pain     BMI - recorded: 30.38 Nutritional Status: BMI > 30  Obese Nutritional Risks: None Diabetes: Yes CBG done?: No (FBS 118 per patient) Did pt. bring in CBG monitor from home?: No  How often do you need to have  someone help you when you read instructions, pamphlets, or other written materials from your doctor or pharmacy?: 1 - Never  Interpreter Needed?: No  Information entered by :: Tora Kindred, CMA   Activities of Daily Living    02/26/2023   12:50 PM  In your present state of health, do you have any difficulty performing the following activities:  Hearing? 0  Vision? 0  Difficulty concentrating or making decisions? 0  Walking or climbing stairs? 0  Dressing or bathing? 0  Doing errands, shopping? 0  Preparing Food and eating ? N  Using the Toilet? N  In the past six months, have you accidently leaked urine? N  Do you have problems with loss of bowel control? N  Managing your Medications? N  Managing your Finances? N  Housekeeping or managing your Housekeeping? N    Patient Care Team: Marjie Skiff, NP as PCP - General (Nurse Practitioner)  Indicate any recent Medical Services you may have received from other than [facility] providers in the past year (date may be approximate).     Assessment:   This is a routine wellness examination for Rudell.  Hearing/Vision screen Hearing Screening - Comments:: Denies hearing loss Vision  Screening - Comments:: Appt scheduled 02/2023 for eye exam   Goals Addressed               This Visit's Progress     Patient Stated (pt-stated)        Lose 20 lbs      Depression Screen    02/26/2023   12:58 PM 01/31/2023    2:01 PM 11/01/2022    2:22 PM 03/27/2022    3:06 PM 02/24/2022    3:40 PM 02/06/2022   12:44 PM 01/11/2022    4:20 PM  PHQ 2/9 Scores  PHQ - 2 Score 0 0 0 0 0 0 0  PHQ- 9 Score 0 0 0 0 0 2 3    Fall Risk    02/26/2023    1:01 PM 01/31/2023    2:01 PM 11/01/2022    2:22 PM 03/27/2022    3:06 PM 02/24/2022    3:39 PM  Fall Risk   Falls in the past year? 0 0 0 0 0  Number falls in past yr: 0 0 0 0 0  Injury with Fall? 0 0 0 0 0  Risk for fall due to : No Fall Risks No Fall Risks No Fall Risks No Fall Risks No Fall Risks  Follow up Falls prevention discussed Falls evaluation completed Falls evaluation completed Falls evaluation completed Falls evaluation completed    MEDICARE RISK AT HOME: Medicare Risk at Home Any stairs in or around the home?: Yes If so, are there any without handrails?: No Home free of loose throw rugs in walkways, pet beds, electrical cords, etc?: Yes Adequate lighting in your home to reduce risk of falls?: Yes Life alert?: No Use of a cane, walker or w/c?: No Grab bars in the bathroom?: No Shower chair or bench in shower?: No Elevated toilet seat or a handicapped toilet?: Yes  TIMED UP AND GO:  Was the test performed?  No    Cognitive Function:        02/26/2023    1:03 PM 02/06/2022   12:41 PM 06/16/2019    9:31 AM 08/08/2017    9:49 AM  6CIT Screen  What Year? 0 points 0 points 0 points 0 points  What month? 0 points 0 points 0 points  0 points  What time? 0 points 0 points 0 points 0 points  Count back from 20 0 points 0 points 0 points 0 points  Months in reverse 0 points 0 points 0 points 0 points  Repeat phrase 0 points 0 points 0 points 0 points  Total Score 0 points 0 points 0 points 0 points     Immunizations Immunization History  Administered Date(s) Administered   Fluad Quad(high Dose 65+) 01/04/2021, 12/16/2021   Fluad Trivalent(High Dose 65+) 11/01/2022   Influenza, High Dose Seasonal PF 12/29/2013, 11/15/2015   Influenza,inj,Quad PF,6+ Mos 12/30/2014, 12/04/2018   Influenza-Unspecified 12/29/2013, 11/15/2015, 12/25/2016   Moderna Covid-19 Fall Seasonal Vaccine 71yrs & older 11/20/2022   PFIZER(Purple Top)SARS-COV-2 Vaccination 04/24/2019, 05/20/2019, 07/13/2020, 01/04/2021   PNEUMOCOCCAL CONJUGATE-20 11/20/2022   Pneumococcal Conjugate-13 12/30/2014   Pneumococcal Polysaccharide-23 03/24/2019   Pneumococcal-Unspecified 06/16/2011   Rsv, Bivalent, Protein Subunit Rsvpref,pf Verdis Frederickson) 01/17/2023   Tdap 03/15/2011, 12/14/2021   Zoster Recombinant(Shingrix) 04/02/2019, 10/17/2019   Zoster, Live 06/16/2011    TDAP status: Up to date  Flu Vaccine status: Up to date  Pneumococcal vaccine status: Up to date  Covid-19 vaccine status: Completed vaccines  Qualifies for Shingles Vaccine? Yes   Zostavax completed Yes   Shingrix Completed?: Yes  Screening Tests Health Maintenance  Topic Date Due   OPHTHALMOLOGY EXAM  07/21/2021   Diabetic kidney evaluation - Urine ACR  03/28/2023   HEMOGLOBIN A1C  08/01/2023   FOOT EXAM  11/01/2023   Diabetic kidney evaluation - eGFR measurement  01/31/2024   Medicare Annual Wellness (AWV)  02/26/2024   DTaP/Tdap/Td (3 - Td or Tdap) 12/15/2031   Pneumonia Vaccine 40+ Years old  Completed   INFLUENZA VACCINE  Completed   COVID-19 Vaccine  Completed   Hepatitis C Screening  Completed   Zoster Vaccines- Shingrix  Completed   HPV VACCINES  Aged Out   Colonoscopy  Discontinued    Health Maintenance  Health Maintenance Due  Topic Date Due   OPHTHALMOLOGY EXAM  07/21/2021   Diabetic kidney evaluation - Urine ACR  03/28/2023    Colorectal cancer screening: No longer required.   Lung Cancer Screening: (Low Dose CT Chest  recommended if Age 94-80 years, 20 pack-year currently smoking OR have quit w/in 15years.) does not qualify.   Lung Cancer Screening Referral: n/a  Additional Screening:  Hepatitis C Screening: does not qualify; Completed 03/10/17  Vision Screening: Recommended annual ophthalmology exams for early detection of glaucoma and other disorders of the eye. Is the patient up to date with their annual eye exam?  No , has appointment scheduled 02/2023 Who is the provider or what is the name of the office in which the patient attends annual eye exams? [facility] If pt is not established with a provider, would they like to be referred to a provider to establish care? No .   Dental Screening: Recommended annual dental exams for proper oral hygiene  Diabetic Foot Exam: Diabetic Foot Exam: Completed 9/*4/24  Community Resource Referral / Chronic Care Management: CRR required this visit?  No   CCM required this visit?  No     Plan:     I have personally reviewed and noted the following in the patient's chart:   Medical and social history Use of alcohol, tobacco or illicit drugs  Current medications and supplements including opioid prescriptions. Patient is not currently taking opioid prescriptions. Functional ability and status Nutritional status Physical activity Advanced directives List of other physicians Hospitalizations, surgeries, and  ER visits in previous 12 months Vitals Screenings to include cognitive, depression, and falls Referrals and appointments  In addition, I have reviewed and discussed with patient certain preventive protocols, quality metrics, and best practice recommendations. A written personalized care plan for preventive services as well as general preventive health recommendations were provided to patient.     Tora Kindred, CMA   02/26/2023   After Visit Summary: (Mail) Due to this being a telephonic visit, the after visit summary with patients personalized  plan was offered to patient via mail   Nurse Notes:  Patient past due for DM eye exam, but does have appointment 02/2023. Stressed the importance of getting every year. Declined DM & Nutrition education

## 2023-03-26 ENCOUNTER — Other Ambulatory Visit: Payer: Self-pay | Admitting: Nurse Practitioner

## 2023-03-26 NOTE — Telephone Encounter (Signed)
Patient requesting refill on   (LANTUS) 100 UNIT/ML Solostar Pen   To be sent to [facility]. He is stating he is now out.

## 2023-03-27 DIAGNOSIS — H2513 Age-related nuclear cataract, bilateral: Secondary | ICD-10-CM | POA: Diagnosis not present

## 2023-03-27 DIAGNOSIS — E119 Type 2 diabetes mellitus without complications: Secondary | ICD-10-CM | POA: Diagnosis not present

## 2023-03-27 DIAGNOSIS — H35371 Puckering of macula, right eye: Secondary | ICD-10-CM | POA: Diagnosis not present

## 2023-03-27 LAB — HM DIABETES EYE EXAM

## 2023-03-28 ENCOUNTER — Encounter: Payer: Self-pay | Admitting: Nurse Practitioner

## 2023-03-30 ENCOUNTER — Other Ambulatory Visit: Payer: Self-pay | Admitting: Nurse Practitioner

## 2023-03-30 DIAGNOSIS — E119 Type 2 diabetes mellitus without complications: Secondary | ICD-10-CM

## 2023-03-30 NOTE — Telephone Encounter (Signed)
Requested Prescriptions  Pending Prescriptions Disp Refills   metFORMIN (GLUCOPHAGE) 1000 MG tablet [Pharmacy Med Name: METFORMIN HCL 1,000 MG TABLET] 180 tablet 0    Sig: Take 1 tablet (1,000 mg total) by mouth 2 (two) times daily with a meal.     Endocrinology:  Diabetes - Biguanides Failed - 03/30/2023  4:16 PM      Failed - Cr in normal range and within 360 days    Creatinine, Ser  Date Value Ref Range Status  01/31/2023 1.31 (H) 0.76 - 1.27 mg/dL Final         Failed - eGFR in normal range and within 360 days    GFR calc Af Amer  Date Value Ref Range Status  09/19/2019 53 (L) >59 mL/min/1.73 Final    Comment:    **[facility] currently reports eGFR in compliance with the current**   recommendations of the [employer]. [facility] will   update reporting as new guidelines are published from the NKF-ASN   Task force.    GFR, Estimated  Date Value Ref Range Status  03/19/2021 55 (L) >60 mL/min Final    Comment:    (NOTE) Calculated using the CKD-EPI Creatinine Equation (2021)    eGFR  Date Value Ref Range Status  01/31/2023 57 (L) >59 mL/min/1.73 Final         Passed - HBA1C is between 0 and 7.9 and within 180 days    HB A1C (BAYER DCA - WAIVED)  Date Value Ref Range Status  01/31/2023 7.4 (H) 4.8 - 5.6 % Final    Comment:             Prediabetes: 5.7 - 6.4          Diabetes: >6.4          Glycemic control for adults with diabetes: <7.0          Passed - B12 Level in normal range and within 720 days    Vitamin B-12  Date Value Ref Range Status  03/27/2022 502 232 - 1,245 pg/mL Final         Passed - Valid encounter within last 6 months    Recent Outpatient Visits           1 month ago Type 2 diabetes mellitus with hyperglycemia, with long-term current use of insulin (HCC)   [facility] [place], [place] T, NP   4 months ago Type 2 diabetes mellitus with hyperglycemia, with long-term current use of insulin (HCC)   [place]  [facility] [place], [place] T, NP   1 year ago Type 2 diabetes mellitus with hyperglycemia, with long-term current use of insulin (HCC)   [facility] [place], Corrie Dandy T, NP   1 year ago Arthritis   [place] [facility] [place], [place] T, NP   1 year ago Type 2 diabetes mellitus with hyperglycemia, with long-term current use of insulin (HCC)   [facility] [place], Corrie Dandy T, NP       Future Appointments             In 1 month [place], [place] T, NP [facility]            Passed - CBC within normal limits and completed in the last 12 months    WBC  Date Value Ref Range Status  11/01/2022 8.4 3.4 - 10.8 x10E3/uL Final  03/19/2021 11.4 (H) 4.0 - 10.5  K/uL Final   RBC  Date Value Ref Range Status  11/01/2022 5.06 4.14 - 5.80 x10E6/uL Final  03/19/2021 3.74 (L) 4.22 - 5.81 MIL/uL Final   Hemoglobin  Date Value Ref Range Status  11/01/2022 14.7 13.0 - 17.7 g/dL Final   Hematocrit  Date Value Ref Range Status  11/01/2022 45.4 37.5 - 51.0 % Final   MCHC  Date Value Ref Range Status  11/01/2022 32.4 31.5 - 35.7 g/dL Final  16/11/9602 54.0 30.0 - 36.0 g/dL Final   [facility]  Date Value Ref Range Status  11/01/2022 29.1 26.6 - 33.0 pg Final  03/19/2021 29.4 26.0 - 34.0 pg Final   MCV  Date Value Ref Range Status  11/01/2022 90 79 - 97 fL Final   No results found for: "PLTCOUNTKUC", "LABPLAT", "POCPLA" RDW  Date Value Ref Range Status  11/01/2022 12.9 11.6 - 15.4 % Final

## 2023-05-01 ENCOUNTER — Ambulatory Visit: Payer: Self-pay | Admitting: Nurse Practitioner

## 2023-07-20 IMAGING — DX DG KNEE COMPLETE 4+V*L*
4 series · 4 of 4 positions shown · non-contrast
Comparison: None.

CLINICAL DATA: Knee pain.  No known injury.

EXAM:
LEFT KNEE - COMPLETE 4+ VIEW

[knee ap]
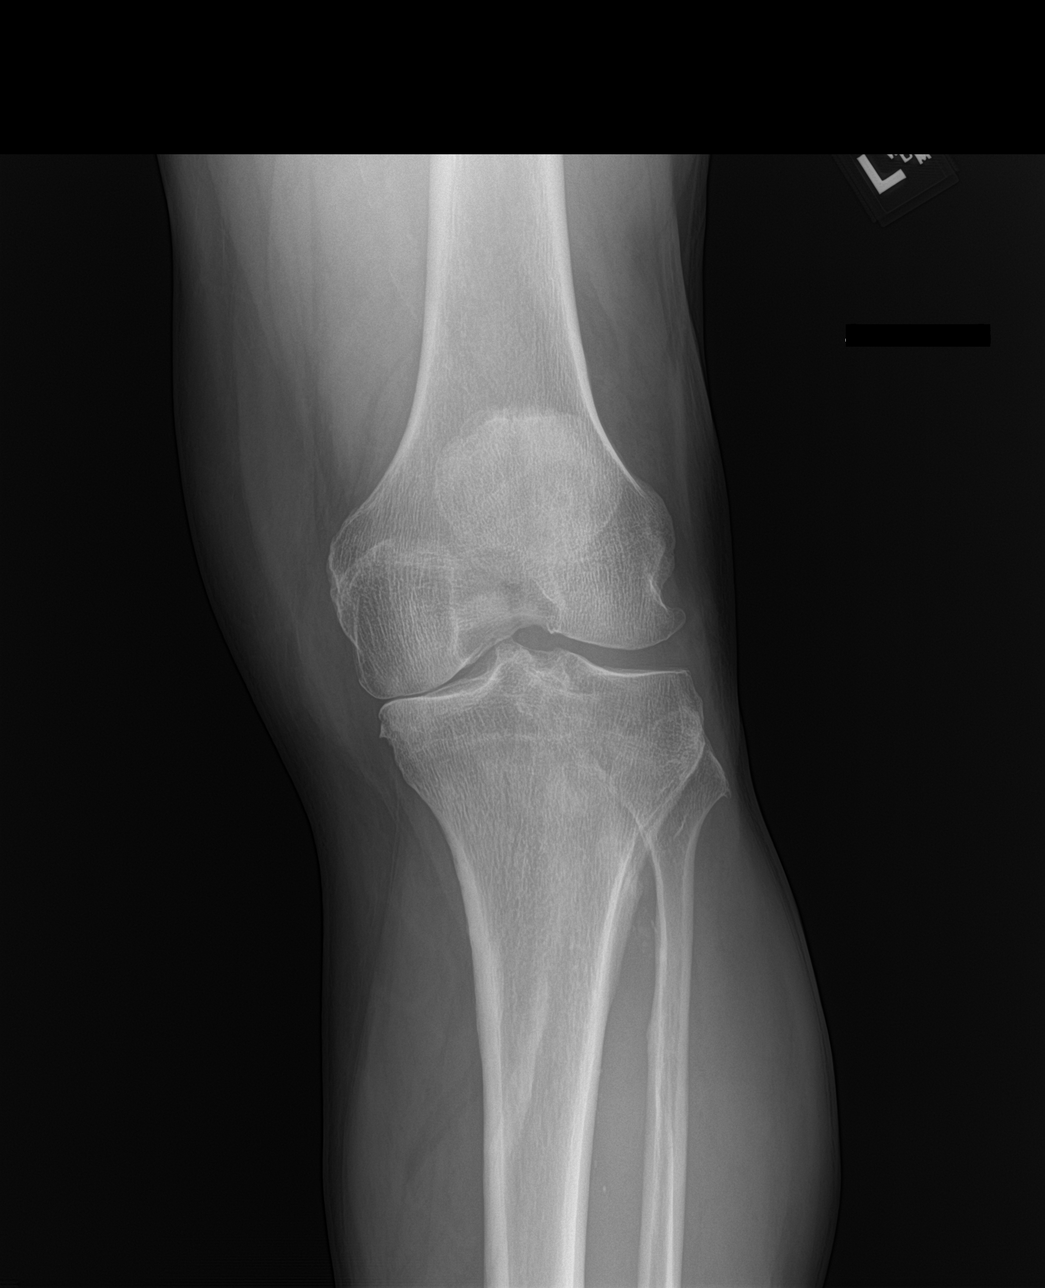

[knee lat]
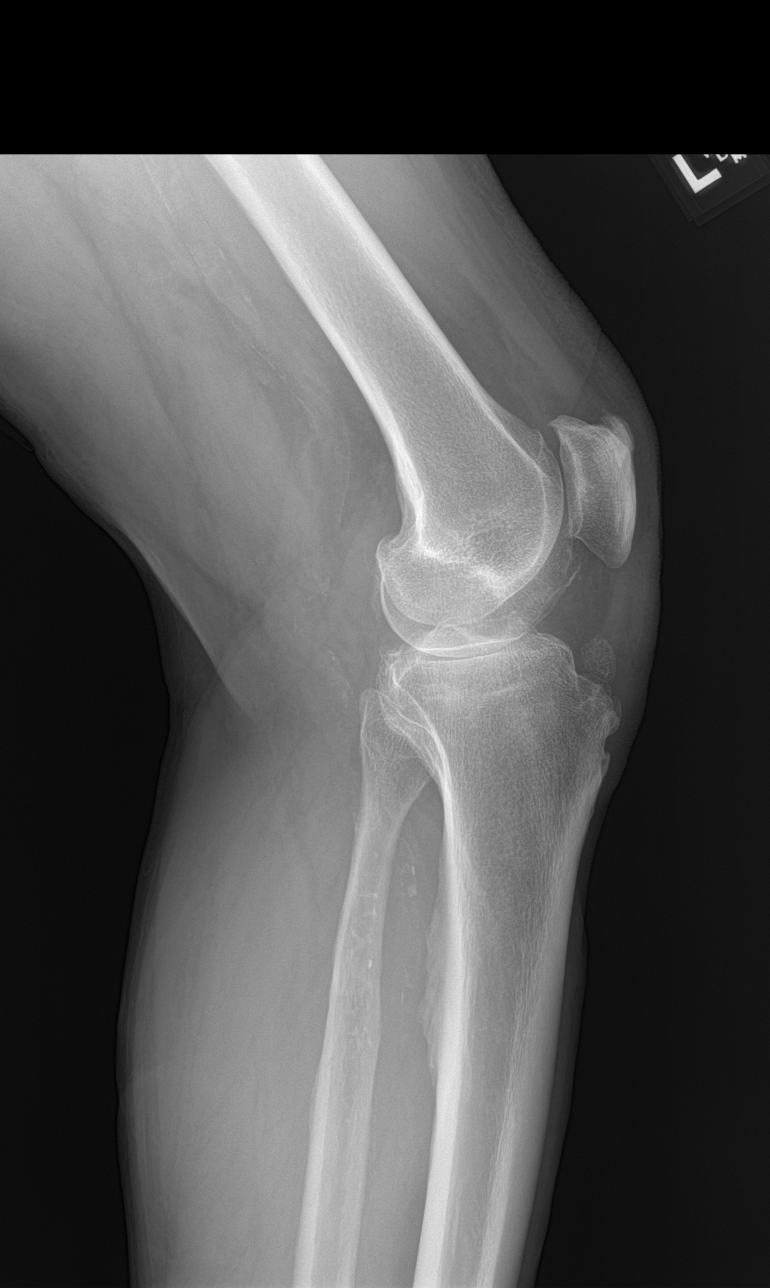

[patella skyline]
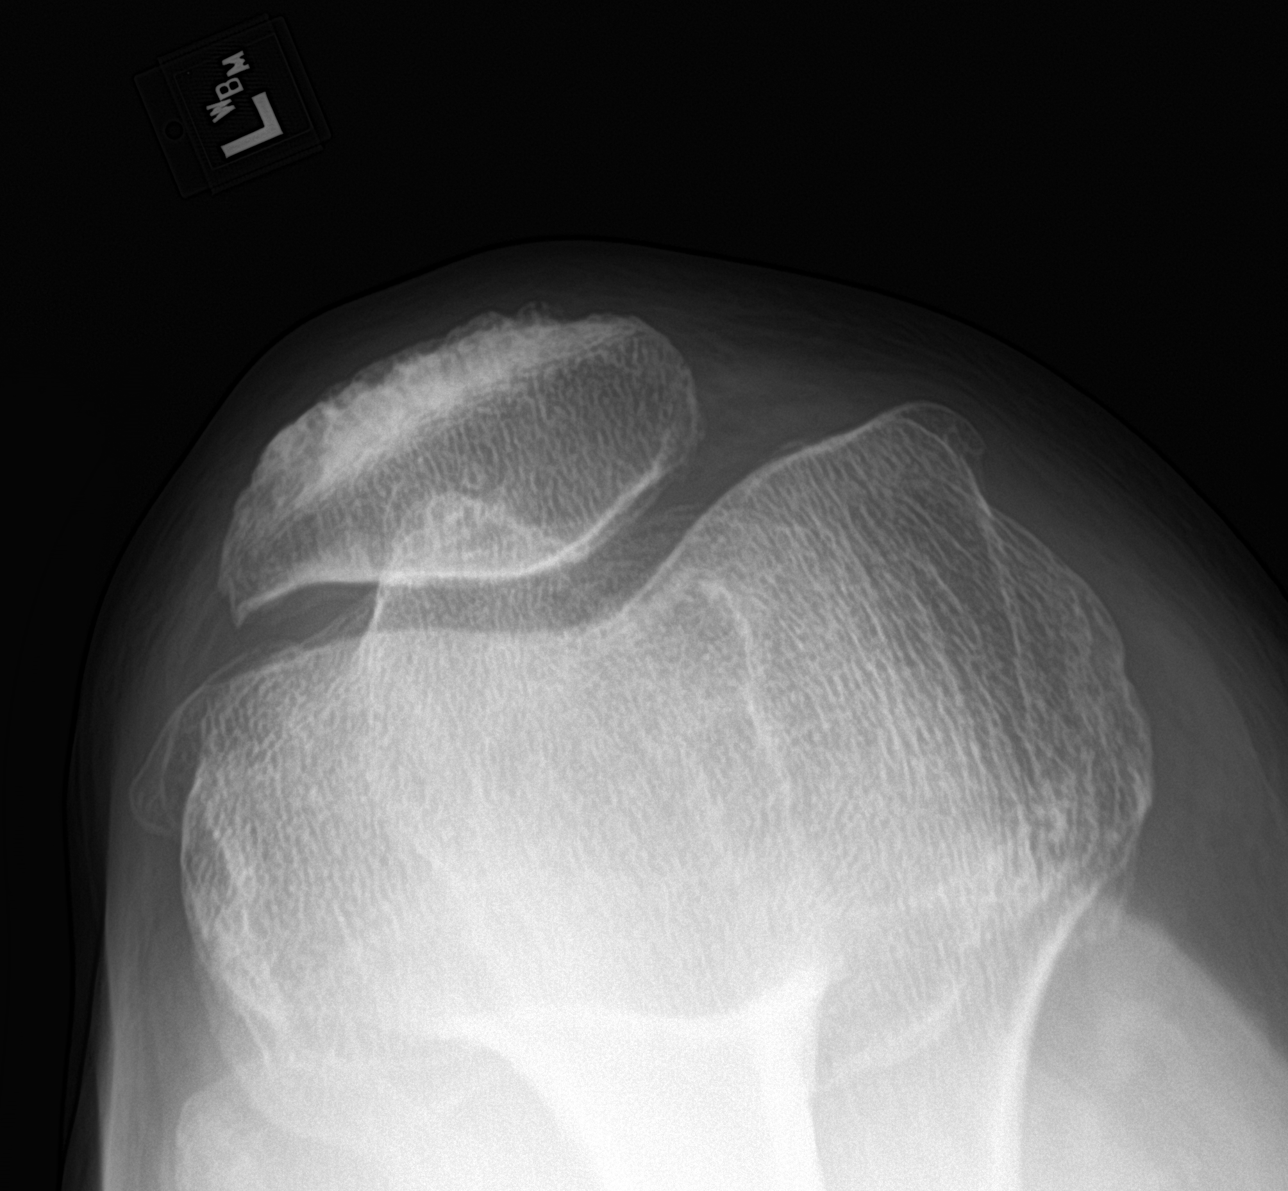

[knee tunnel]
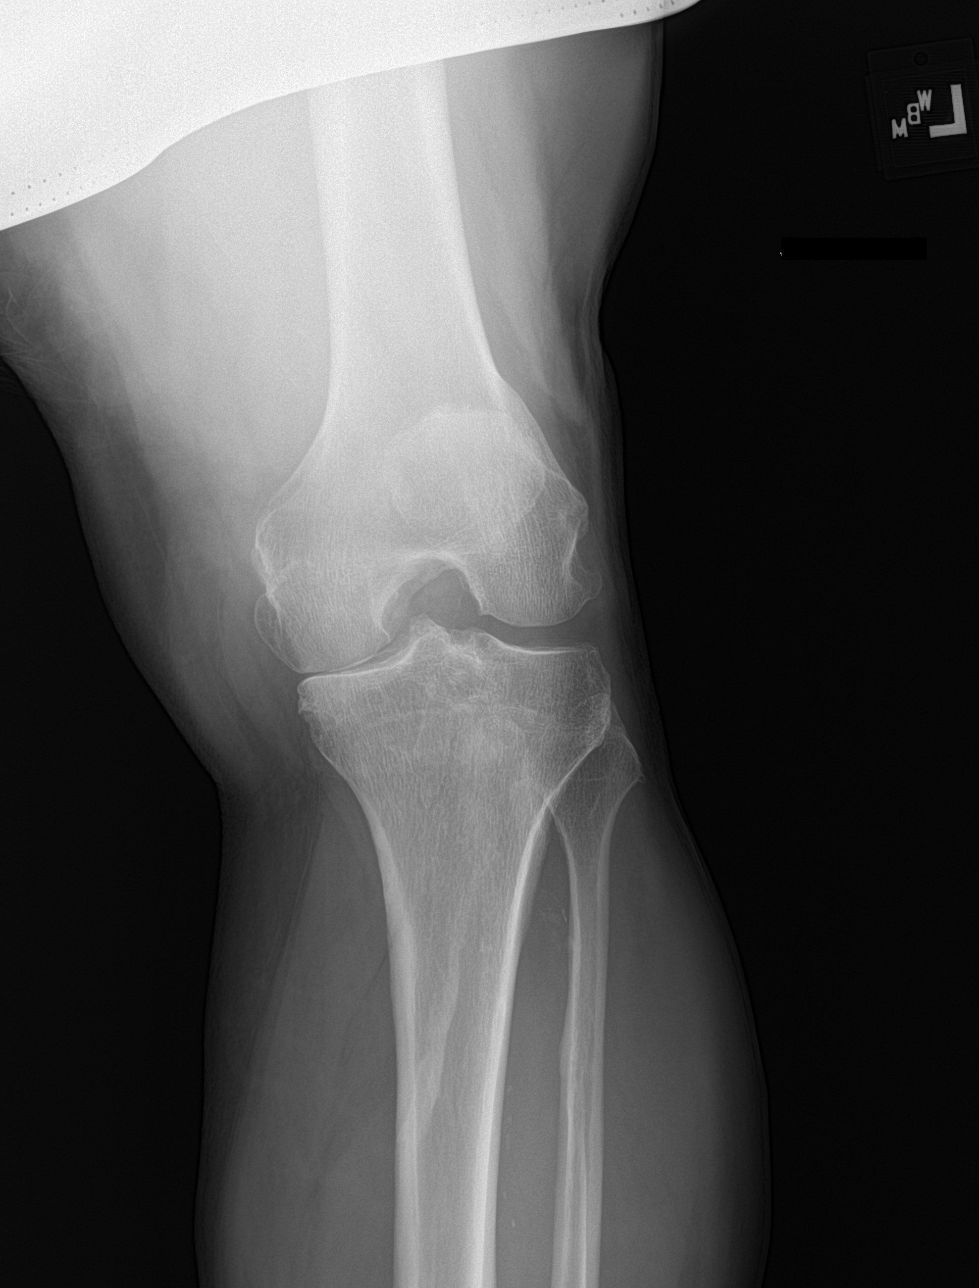

[4 of 4 positions shown; findings below may reference images not displayed]

FINDINGS: No acute fracture or malalignment. Moderate-severe medial
compartment osteoarthritis of the left knee with near bone-on-bone
apposition. Lateral and patellofemoral joint spaces are preserved
with minimal marginal osteophytosis. No significant knee joint
effusion. Vascular calcifications are present. No focal soft tissue
swelling.
IMPRESSION: Moderate-severe medial compartment osteoarthritis of the left knee.

## 2023-09-03 ENCOUNTER — Other Ambulatory Visit: Payer: Self-pay | Admitting: Nurse Practitioner

## 2023-09-03 MED ORDER — LANTUS SOLOSTAR 100 UNIT/ML ~~LOC~~ SOPN: 50.0000 [IU] | PEN_INJECTOR | Freq: Every day | SUBCUTANEOUS | 1 refills | Status: DC

## 2023-09-03 NOTE — Telephone Encounter (Signed)
 Appt scheduled 10/17/23

## 2023-09-03 NOTE — Telephone Encounter (Signed)
 Prescription Request  09/03/2023  LOV: 01/31/2023  What is the name of the medication or equipment?   LANTUS  SOLOSTAR) 100 UNIT/ML Solostar   Have you contacted your pharmacy to request a refill? Yes   Which pharmacy would you like this sent to?  [facility] - [place], [place] - [address] [place] [address] Phone: [phone] Fax: [phone]    Patient notified that their request is being sent to the clinical staff for review and that they should receive a response within 2 business days.   Please advise at Mobile [phone] (mobile)

## 2023-09-05 NOTE — Telephone Encounter (Signed)
 Disp Refills Start End   insulin  glargine (LANTUS  SOLOSTAR) 100 UNIT/ML Solostar Pen 15 mL 1 09/03/2023 --   Sig - Route: Inject 50 Units into the skin at bedtime. - Subcutaneous   Sent to pharmacy as: insulin  glargine (LANTUS  SOLOSTAR) 100 UNIT/ML Solostar Pen   E-Prescribing Status: Receipt confirmed by pharmacy (09/03/2023  2:01 PM EDT)

## 2023-09-20 ENCOUNTER — Telehealth: Payer: Self-pay

## 2023-09-20 NOTE — Progress Notes (Signed)
   09/20/2023  Patient ID: Matthew [name], male   DOB: [birth year], 78 y.o.   MRN: [id number]  This patient is appearing on a report for being at risk of failing the adherence measure for cholesterol (statin) medications this calendar year.   Medication: atorvastatin  20mg  Last fill date: 08/09/2023 for 90 day supply  Insurance report was not up to date. No action needed at this time.   Matthew [name], PharmD, DPLA

## 2023-09-26 ENCOUNTER — Other Ambulatory Visit: Payer: Self-pay | Admitting: Nurse Practitioner

## 2023-09-26 DIAGNOSIS — E119 Type 2 diabetes mellitus without complications: Secondary | ICD-10-CM

## 2023-09-27 NOTE — Telephone Encounter (Signed)
 Requested Prescriptions  Pending Prescriptions Disp Refills   metFORMIN  (GLUCOPHAGE ) 1000 MG tablet [Pharmacy Med Name: METFORMIN  HCL 1,000 MG TABLET] 60 tablet 0    Sig: Take 1 tablet (1,000 mg total) by mouth 2 (two) times daily with a meal.     Endocrinology:  Diabetes - Biguanides Failed - 09/27/2023  9:30 AM      Failed - Cr in normal range and within 360 days    Creatinine, Ser  Date Value Ref Range Status  01/31/2023 1.31 (H) 0.76 - 1.27 mg/dL Final         Failed - HBA1C is between 0 and 7.9 and within 180 days    HB A1C (BAYER DCA - WAIVED)  Date Value Ref Range Status  01/31/2023 7.4 (H) 4.8 - 5.6 % Final    Comment:             Prediabetes: 5.7 - 6.4          Diabetes: >6.4          Glycemic control for adults with diabetes: <7.0          Failed - eGFR in normal range and within 360 days    GFR calc Af Amer  Date Value Ref Range Status  09/19/2019 53 (L) >59 mL/min/1.73 Final    Comment:    **[facility] currently reports eGFR in compliance with the current**   recommendations of the [employer]. [facility] will   update reporting as new guidelines are published from the NKF-ASN   Task force.    GFR, Estimated  Date Value Ref Range Status  03/19/2021 55 (L) >60 mL/min Final    Comment:    (NOTE) Calculated using the CKD-EPI Creatinine Equation (2021)    eGFR  Date Value Ref Range Status  01/31/2023 57 (L) >59 mL/min/1.73 Final         Failed - Valid encounter within last 6 months    Recent Outpatient Visits   None            Passed - B12 Level in normal range and within 720 days    Vitamin B-12  Date Value Ref Range Status  03/27/2022 502 232 - 1,245 pg/mL Final         Passed - CBC within normal limits and completed in the last 12 months    WBC  Date Value Ref Range Status  11/01/2022 8.4 3.4 - 10.8 x10E3/uL Final  03/19/2021 11.4 (H) 4.0 - 10.5 K/uL Final   RBC  Date Value Ref Range Status  11/01/2022 5.06 4.14 - 5.80 x10E6/uL  Final  03/19/2021 3.74 (L) 4.22 - 5.81 MIL/uL Final   Hemoglobin  Date Value Ref Range Status  11/01/2022 14.7 13.0 - 17.7 g/dL Final   Hematocrit  Date Value Ref Range Status  11/01/2022 45.4 37.5 - 51.0 % Final   MCHC  Date Value Ref Range Status  11/01/2022 32.4 31.5 - 35.7 g/dL Final  98/78/7976 66.5 30.0 - 36.0 g/dL Final   [facility]  Date Value Ref Range Status  11/01/2022 29.1 26.6 - 33.0 pg Final  03/19/2021 29.4 26.0 - 34.0 pg Final   MCV  Date Value Ref Range Status  11/01/2022 90 79 - 97 fL Final   No results found for: PLTCOUNTKUC, LABPLAT, POCPLA RDW  Date Value Ref Range Status  11/01/2022 12.9 11.6 - 15.4 % Final

## 2023-10-17 ENCOUNTER — Ambulatory Visit: Admitting: Nurse Practitioner

## 2023-11-01 ENCOUNTER — Other Ambulatory Visit: Payer: Self-pay | Admitting: Nurse Practitioner

## 2023-11-01 DIAGNOSIS — E119 Type 2 diabetes mellitus without complications: Secondary | ICD-10-CM

## 2023-11-01 DIAGNOSIS — I10 Essential (primary) hypertension: Secondary | ICD-10-CM

## 2023-11-01 NOTE — Telephone Encounter (Signed)
 Requested medications are due for refill today.  yes  Requested medications are on the active medications list.  yes  Last refill. varied  Future visit scheduled.   yes  Notes to clinic.  Labs are expired.     Requested Prescriptions  Pending Prescriptions Disp Refills   metFORMIN  (GLUCOPHAGE ) 1000 MG tablet [Pharmacy Med Name: METFORMIN  HCL 1,000 MG TABLET] 60 tablet 0    Sig: Take 1 tablet (1,000 mg total) by mouth 2 (two) times daily with a meal.     Endocrinology:  Diabetes - Biguanides Failed - 11/01/2023  4:11 PM      Failed - Cr in normal range and within 360 days    Creatinine, Ser  Date Value Ref Range Status  01/31/2023 1.31 (H) 0.76 - 1.27 mg/dL Final         Failed - HBA1C is between 0 and 7.9 and within 180 days    HB A1C (BAYER DCA - WAIVED)  Date Value Ref Range Status  01/31/2023 7.4 (H) 4.8 - 5.6 % Final    Comment:             Prediabetes: 5.7 - 6.4          Diabetes: >6.4          Glycemic control for adults with diabetes: <7.0          Failed - eGFR in normal range and within 360 days    GFR calc Af Amer  Date Value Ref Range Status  09/19/2019 53 (L) >59 mL/min/1.73 Final    Comment:    **[facility] currently reports eGFR in compliance with the current**   recommendations of the [employer]. [facility] will   update reporting as new guidelines are published from the NKF-ASN   Task force.    GFR, Estimated  Date Value Ref Range Status  03/19/2021 55 (L) >60 mL/min Final    Comment:    (NOTE) Calculated using the CKD-EPI Creatinine Equation (2021)    eGFR  Date Value Ref Range Status  01/31/2023 57 (L) >59 mL/min/1.73 Final         Failed - Valid encounter within last 6 months    Recent Outpatient Visits   None            Failed - CBC within normal limits and completed in the last 12 months    WBC  Date Value Ref Range Status  11/01/2022 8.4 3.4 - 10.8 x10E3/uL Final  03/19/2021 11.4 (H) 4.0 - 10.5 K/uL Final   RBC   Date Value Ref Range Status  11/01/2022 5.06 4.14 - 5.80 x10E6/uL Final  03/19/2021 3.74 (L) 4.22 - 5.81 MIL/uL Final   Hemoglobin  Date Value Ref Range Status  11/01/2022 14.7 13.0 - 17.7 g/dL Final   Hematocrit  Date Value Ref Range Status  11/01/2022 45.4 37.5 - 51.0 % Final   MCHC  Date Value Ref Range Status  11/01/2022 32.4 31.5 - 35.7 g/dL Final  98/78/7976 66.5 30.0 - 36.0 g/dL Final   [facility]  Date Value Ref Range Status  11/01/2022 29.1 26.6 - 33.0 pg Final  03/19/2021 29.4 26.0 - 34.0 pg Final   MCV  Date Value Ref Range Status  11/01/2022 90 79 - 97 fL Final   No results found for: PLTCOUNTKUC, LABPLAT, POCPLA RDW  Date Value Ref Range Status  11/01/2022 12.9 11.6 - 15.4 % Final         Passed - B12 Level in  normal range and within 720 days    Vitamin B-12  Date Value Ref Range Status  03/27/2022 502 232 - 1,245 pg/mL Final          benazepril  (LOTENSIN ) 40 MG tablet [Pharmacy Med Name: BENAZEPRIL  HCL 40 MG TABLET] 90 tablet 0    Sig: Take 1 tablet (40 mg total) by mouth daily.     Cardiovascular:  ACE Inhibitors Failed - 11/01/2023  4:11 PM      Failed - Cr in normal range and within 180 days    Creatinine, Ser  Date Value Ref Range Status  01/31/2023 1.31 (H) 0.76 - 1.27 mg/dL Final         Failed - K in normal range and within 180 days    Potassium  Date Value Ref Range Status  01/31/2023 4.6 3.5 - 5.2 mmol/L Final         Failed - Valid encounter within last 6 months    Recent Outpatient Visits   None            Passed - Patient is not pregnant      Passed - Last BP in normal range    BP Readings from Last 1 Encounters:  01/31/23 126/64

## 2023-11-04 DIAGNOSIS — Z794 Long term (current) use of insulin: Secondary | ICD-10-CM | POA: Insufficient documentation

## 2023-11-04 NOTE — Patient Instructions (Signed)

## 2023-11-07 ENCOUNTER — Encounter: Payer: Self-pay | Admitting: Nurse Practitioner

## 2023-11-07 ENCOUNTER — Ambulatory Visit: Admitting: Nurse Practitioner

## 2023-11-07 VITALS — BP 137/73 | HR 86 | Temp 98.1°F | Ht 72.0 in | Wt 241.2 lb

## 2023-11-07 DIAGNOSIS — Z794 Long term (current) use of insulin: Secondary | ICD-10-CM

## 2023-11-07 DIAGNOSIS — E1169 Type 2 diabetes mellitus with other specified complication: Secondary | ICD-10-CM | POA: Diagnosis not present

## 2023-11-07 DIAGNOSIS — E1159 Type 2 diabetes mellitus with other circulatory complications: Secondary | ICD-10-CM

## 2023-11-07 DIAGNOSIS — I471 Supraventricular tachycardia, unspecified: Secondary | ICD-10-CM

## 2023-11-07 DIAGNOSIS — E1165 Type 2 diabetes mellitus with hyperglycemia: Secondary | ICD-10-CM | POA: Diagnosis not present

## 2023-11-07 DIAGNOSIS — N400 Enlarged prostate without lower urinary tract symptoms: Secondary | ICD-10-CM | POA: Diagnosis not present

## 2023-11-07 DIAGNOSIS — I152 Hypertension secondary to endocrine disorders: Secondary | ICD-10-CM

## 2023-11-07 DIAGNOSIS — Z23 Encounter for immunization: Secondary | ICD-10-CM

## 2023-11-07 DIAGNOSIS — N183 Chronic kidney disease, stage 3 unspecified: Secondary | ICD-10-CM

## 2023-11-07 DIAGNOSIS — E785 Hyperlipidemia, unspecified: Secondary | ICD-10-CM | POA: Diagnosis not present

## 2023-11-07 DIAGNOSIS — E119 Type 2 diabetes mellitus without complications: Secondary | ICD-10-CM | POA: Diagnosis not present

## 2023-11-07 DIAGNOSIS — E66811 Obesity, class 1: Secondary | ICD-10-CM | POA: Diagnosis not present

## 2023-11-07 DIAGNOSIS — E538 Deficiency of other specified B group vitamins: Secondary | ICD-10-CM | POA: Diagnosis not present

## 2023-11-07 DIAGNOSIS — E1122 Type 2 diabetes mellitus with diabetic chronic kidney disease: Secondary | ICD-10-CM | POA: Diagnosis not present

## 2023-11-07 LAB — MICROALBUMIN, URINE WAIVED
Creatinine, Urine Waived: 200 mg/dL (ref 10–300)
Microalb, Ur Waived: 30 mg/L — ABNORMAL HIGH (ref 0–19)
Microalb/Creat Ratio: <30 mg/g (ref <30)

## 2023-11-07 LAB — BAYER DCA HB A1C WAIVED: HB A1C (BAYER DCA - WAIVED): 8.6 % — ABNORMAL HIGH (ref 4.8–5.6)

## 2023-11-07 MED ORDER — METFORMIN HCL 1000 MG PO TABS: 1000.0000 mg | ORAL_TABLET | Freq: Two times a day (BID) | ORAL | 2 refills | Status: AC

## 2023-11-07 MED ORDER — BENAZEPRIL HCL 40 MG PO TABS
40.0000 mg | ORAL_TABLET | Freq: Every day | ORAL | 2 refills | Status: AC
Start: 2023-11-07 — End: ?

## 2023-11-07 MED ORDER — INSULIN PEN NEEDLE 31G X 8 MM MISC: 3 refills | Status: DC

## 2023-11-07 MED ORDER — EMPAGLIFLOZIN 25 MG PO TABS
25.0000 mg | ORAL_TABLET | Freq: Every day | ORAL | 2 refills | Status: AC
Start: 2023-11-07 — End: ?

## 2023-11-07 MED ORDER — ATORVASTATIN CALCIUM 20 MG PO TABS
20.0000 mg | ORAL_TABLET | Freq: Every day | ORAL | 2 refills | Status: AC
Start: 2023-11-07 — End: ?

## 2023-11-07 MED ORDER — LANTUS SOLOSTAR 100 UNIT/ML ~~LOC~~ SOPN: 50.0000 [IU] | PEN_INJECTOR | Freq: Every day | SUBCUTANEOUS | 3 refills | Status: AC

## 2023-11-07 NOTE — Assessment & Plan Note (Signed)
Stable with no current BB, took Metoprolol in past, SVT was suspected to be related to infection at the time.

## 2023-11-07 NOTE — Assessment & Plan Note (Signed)
 Chronic, ongoing.  CKD 3a on labs, remaining stable.  Recheck CMP today and refer to nephrology as needed for worsening.  Urine ALB 27 November 2023.  Refer to diabetes with hyperglycemia plan of care.

## 2023-11-07 NOTE — Assessment & Plan Note (Signed)
 Chronic, ongoing.  Continue current medication regimen and adjust as needed. Lipid panel today.

## 2023-11-07 NOTE — Assessment & Plan Note (Signed)
Check PSA on labs today.  No symptoms.

## 2023-11-07 NOTE — Assessment & Plan Note (Signed)
BMI 32.71.  Recommended eating smaller high protein, low fat meals more frequently and exercising 30 mins a day 5 times a week with a goal of 10-15lb weight loss in the next 3 months. Patient voiced their understanding and motivation to adhere to these recommendations.

## 2023-11-07 NOTE — Progress Notes (Addendum)
 BP 137/73 (BP Location: Left Arm, Patient Position: Sitting, Cuff Size: Large)   Pulse 86   Temp 98.1 F (36.7 C) (Oral)   Ht 6' (1.829 m)   Wt 241 lb 3.2 oz (109.4 kg)   SpO2 96%   BMI 32.71 kg/m    Subjective:    Patient ID: Matthew [name], male    DOB: 27-Dec-1945, 78 y.o.   MRN: [id number]  HPI: Matthew [name] is a 78 y.o. male  Chief Complaint  Patient presents with   Follow-up   Medication Management    Needs refills on all medications    Lost to follow-up since December 2024. Has been out of all medications for a few weeks. Needs assist with medication costs, but often does not qualify due to assets. He does report he is not sure why he does not attend visits as recommended and scheduled, he just feels he is doing well.  DIABETES December A1c 7.4%. Uses 50 units of Lantus , Jardiance  12.5 MG daily (not taking full tablet due to cost of $160 a month) + Metformin  1000 MG BID. Supplement continues for low B12, history of low levels.  Was going to [facility] in past, but messed up his shoulders and has been out for a month. Has been eating a lot more sandwiches, more bread in the summer.  HISTORY: Ozempic  in past caused allergic reaction, swelling. Januvia  is tier 3 too.  Hypoglycemic episodes:no Polydipsia/polyuria: no Visual disturbance: no Chest pain: no Paresthesias: no Glucose Monitoring: yes  Accucheck frequency: today is the first day he has checked in 3 months  Fasting glucose: 134 this morning  Post prandial:  Evening:  Before meals: Taking Insulin ?: yes  Long acting insulin : Lantus  50 units  Short acting insulin : Blood Pressure Monitoring: not checking Retinal Examination: Not Up To Date Foot Exam: Up to Date Diabetic Education: Completed Pneumovax: Up to Date Influenza: Up to Date Aspirin : yes  HYPERTENSION / HYPERLIPIDEMIA Taking Benazepril  and Atorvastatin . Occasionally forgets doses. Satisfied with current treatment? yes Duration of hypertension:  chronic BP monitoring frequency: occasional BP range: on average <130/80 BP medication side effects: no Duration of hyperlipidemia: chronic Cholesterol medication side effects: no Cholesterol supplements: none Medication compliance: good compliance Aspirin : yes Recent stressors: no Recurrent headaches: no Visual changes: no Palpitations: no  Dyspnea: no Chest pain: no Lower extremity edema: no Dizzy/lightheaded: no    CHRONIC KIDNEY DISEASE (CKD 3a) CKD status: stable Medications renally dose: yes Previous renal evaluation: no Pneumovax:  Up to Date Influenza Vaccine:  Up to Date  Relevant past medical, surgical, family and social history reviewed and updated as indicated. Interim medical history since our last visit reviewed. Allergies and medications reviewed and updated.  Review of Systems  Constitutional:  Negative for activity change, diaphoresis, fatigue and fever.  Respiratory:  Negative for cough, chest tightness, shortness of breath and wheezing.   Cardiovascular:  Negative for chest pain, palpitations and leg swelling.  Endocrine: Negative for polydipsia, polyphagia and polyuria.  Neurological:  Negative for dizziness, syncope, weakness, light-headedness, numbness and headaches.  Psychiatric/Behavioral: Negative.     Per HPI unless specifically indicated above     Objective:    BP 137/73 (BP Location: Left Arm, Patient Position: Sitting, Cuff Size: Large)   Pulse 86   Temp 98.1 F (36.7 C) (Oral)   Ht 6' (1.829 m)   Wt 241 lb 3.2 oz (109.4 kg)   SpO2 96%   BMI 32.71 kg/m   Wt Readings from  Last 3 Encounters:  11/07/23 241 lb 3.2 oz (109.4 kg)  02/26/23 224 lb (101.6 kg)  01/31/23 242 lb 6.4 oz (110 kg)    Physical Exam Vitals and nursing note reviewed.  Constitutional:      General: He is awake. He is not in acute distress.    Appearance: He is well-developed and well-groomed. He is obese. He is not ill-appearing or toxic-appearing.  HENT:      Head: Normocephalic.  Eyes:     General: Lids are normal.     Extraocular Movements: Extraocular movements intact.     Conjunctiva/sclera: Conjunctivae normal.  Neck:     Thyroid : No thyromegaly.     Vascular: No carotid bruit.  Cardiovascular:     Rate and Rhythm: Normal rate and regular rhythm.     Heart sounds: Normal heart sounds.  Pulmonary:     Effort: No accessory muscle usage or respiratory distress.     Breath sounds: Normal breath sounds.  Abdominal:     General: Bowel sounds are normal. There is no distension.     Palpations: Abdomen is soft.     Tenderness: There is no abdominal tenderness.  Musculoskeletal:     Cervical back: Full passive range of motion without pain.     Right lower leg: No edema.     Left lower leg: No edema.  Lymphadenopathy:     Cervical: No cervical adenopathy.  Skin:    General: Skin is warm.     Capillary Refill: Capillary refill takes less than 2 seconds.  Neurological:     Mental Status: He is alert and oriented to person, place, and time.     Deep Tendon Reflexes: Reflexes are normal and symmetric.     Reflex Scores:      Brachioradialis reflexes are 2+ on the right side and 2+ on the left side.      Patellar reflexes are 2+ on the right side and 2+ on the left side. Psychiatric:        Attention and Perception: Attention normal.        Mood and Affect: Mood normal.        Speech: Speech normal.        Behavior: Behavior normal. Behavior is cooperative.        Thought Content: Thought content normal.    Diabetic Foot Exam - Simple   Simple Foot Form Visual Inspection See comments: Yes Sensation Testing See comments: Yes Pulse Check See comments: Yes Comments Dry skin and thick toenails     Results for orders placed or performed in visit on 11/07/23  Bayer DCA Hb A1c Waived   Collection Time: 11/07/23  1:31 PM  Result Value Ref Range   HB A1C (BAYER DCA - WAIVED) 8.6 (H) 4.8 - 5.6 %  Microalbumin, Urine Waived    Collection Time: 11/07/23  1:31 PM  Result Value Ref Range   Microalb, Ur Waived 30 (H) 0 - 19 mg/L   Creatinine, Urine Waived 200 10 - 300 mg/dL   Microalb/Creat Ratio <30 <30 mg/g      Assessment & Plan:   Problem List Items Addressed This Visit       Cardiovascular and Mediastinum   SVT (supraventricular tachycardia) (HCC)   Stable with no current BB, took Metoprolol  in past, SVT was suspected to be related to infection at the time.      Relevant Medications   atorvastatin  (LIPITOR) 20 MG tablet   benazepril  (LOTENSIN ) 40 MG  tablet   Hypertension associated with diabetes (HCC)   Chronic, ongoing.  BP close to goal today.  Recommend he monitor BP at least a few mornings a week at home and document.  DASH diet at home.  Continue current medication regimen and adjust as needed.  Labs today: CMP, CBC, TSH, urine ALB.  Urine ALB 27 November 2023.  Benazepril  for kidney protection in diabetes.       Relevant Medications   atorvastatin  (LIPITOR) 20 MG tablet   benazepril  (LOTENSIN ) 40 MG tablet   empagliflozin  (JARDIANCE ) 25 MG TABS tablet   insulin  glargine (LANTUS  SOLOSTAR) 100 UNIT/ML Solostar Pen   metFORMIN  (GLUCOPHAGE ) 1000 MG tablet   Other Relevant Orders   Bayer DCA Hb A1c Waived (Completed)   Microalbumin, Urine Waived (Completed)   CBC with Differential/Platelet   Comprehensive metabolic panel with GFR   TSH   AMB Referral VBCI Care Management     Endocrine   Type 2 diabetes mellitus with hyperglycemia (HCC) - Primary   Chronic, ongoing. A1c 8.6% today, trending up, and urine ALB 27 November 2023. He has been lost to follow-up since December, missed visits, and has been out of medication for 3 weeks.  Is concerned about Jardiance  cost.  New VBCI referral to talk with pharmacist and assist with adherence. - Continue all current medications and will look into assist options with pharmacist.  We discussed at length today the importance of adherence and risks if A1c  remains >6.5%. - Will need to renal dose Metformin  if eGFR <45, monitor closely, discussed with patient.   - Did not tolerate Ozempic  in past.   - Recommend continue to check BS 2-3 times daily and document for visits.   - Eye exam needed.  Foot exam up to date. - Vaccinations up to date. - ACE and Statin on board. - Return in 3 months, highly recommend he maintain regular visits.      Relevant Medications   atorvastatin  (LIPITOR) 20 MG tablet   benazepril  (LOTENSIN ) 40 MG tablet   empagliflozin  (JARDIANCE ) 25 MG TABS tablet   insulin  glargine (LANTUS  SOLOSTAR) 100 UNIT/ML Solostar Pen   metFORMIN  (GLUCOPHAGE ) 1000 MG tablet   Other Relevant Orders   Bayer DCA Hb A1c Waived (Completed)   AMB Referral VBCI Care Management   Insulin  dependent type 2 diabetes mellitus (HCC)   Chronic, ongoing. A1c 8.6% today, trending up, and urine ALB 27 November 2023. He has been lost to follow-up since December, missed visits, and has been out of medication for 3 weeks.  Is concerned about Jardiance  cost.  New VBCI referral to talk with pharmacist and assist with adherence. - Continue all current medications and will look into assist options with pharmacist.  We discussed at length today the importance of adherence and risks if A1c remains >6.5%. - Will need to renal dose Metformin  if eGFR <45, monitor closely, discussed with patient.   - Did not tolerate Ozempic  in past.   - Recommend continue to check BS 2-3 times daily and document for visits.   - Eye exam needed.  Foot exam up to date. - Vaccinations up to date. - ACE and Statin on board. - Return in 3 months, highly recommend he maintain regular visits.      Relevant Medications   atorvastatin  (LIPITOR) 20 MG tablet   benazepril  (LOTENSIN ) 40 MG tablet   empagliflozin  (JARDIANCE ) 25 MG TABS tablet   insulin  glargine (LANTUS  SOLOSTAR) 100 UNIT/ML Solostar Pen   metFORMIN  (GLUCOPHAGE )  1000 MG tablet   Other Relevant Orders   Bayer DCA Hb A1c  Waived (Completed)   AMB Referral VBCI Care Management   Hyperlipidemia associated with type 2 diabetes mellitus (HCC)   Chronic, ongoing.  Continue current medication regimen and adjust as needed.  Lipid panel today.      Relevant Medications   atorvastatin  (LIPITOR) 20 MG tablet   benazepril  (LOTENSIN ) 40 MG tablet   empagliflozin  (JARDIANCE ) 25 MG TABS tablet   insulin  glargine (LANTUS  SOLOSTAR) 100 UNIT/ML Solostar Pen   metFORMIN  (GLUCOPHAGE ) 1000 MG tablet   Other Relevant Orders   Bayer DCA Hb A1c Waived (Completed)   Comprehensive metabolic panel with GFR   Lipid Panel w/o Chol/HDL Ratio   AMB Referral VBCI Care Management   CKD stage 3 due to type 2 diabetes mellitus (HCC)   Chronic, ongoing.  CKD 3a on labs, remaining stable.  Recheck CMP today and refer to nephrology as needed for worsening.  Urine ALB 27 November 2023.  Refer to diabetes with hyperglycemia plan of care.      Relevant Medications   atorvastatin  (LIPITOR) 20 MG tablet   benazepril  (LOTENSIN ) 40 MG tablet   empagliflozin  (JARDIANCE ) 25 MG TABS tablet   insulin  glargine (LANTUS  SOLOSTAR) 100 UNIT/ML Solostar Pen   metFORMIN  (GLUCOPHAGE ) 1000 MG tablet   Other Relevant Orders   Bayer DCA Hb A1c Waived (Completed)   Microalbumin, Urine Waived (Completed)   Comprehensive metabolic panel with GFR   AMB Referral VBCI Care Management     Genitourinary   BPH (benign prostatic hyperplasia)   Check PSA on labs today.  No symptoms.      Relevant Orders   PSA     Other   Obesity   BMI 32.71. Recommended eating smaller high protein, low fat meals more frequently and exercising 30 mins a day 5 times a week with a goal of 10-15lb weight loss in the next 3 months. Patient voiced their understanding and motivation to adhere to these recommendations.       Relevant Medications   empagliflozin  (JARDIANCE ) 25 MG TABS tablet   insulin  glargine (LANTUS  SOLOSTAR) 100 UNIT/ML Solostar Pen   metFORMIN  (GLUCOPHAGE )  1000 MG tablet   B12 deficiency   Chronic, ongoing.  Recommend continue supplement daily.  Recheck level.      Relevant Orders   CBC with Differential/Platelet   Vitamin B12     Follow up plan: Return in about 3 months (around 02/06/2024) for T2DM, HTN/HLD.

## 2023-11-07 NOTE — Assessment & Plan Note (Signed)
 Chronic, ongoing.  BP close to goal today.  Recommend he monitor BP at least a few mornings a week at home and document.  DASH diet at home.  Continue current medication regimen and adjust as needed.  Labs today: CMP, CBC, TSH, urine ALB.  Urine ALB 27 November 2023.  Benazepril  for kidney protection in diabetes.

## 2023-11-07 NOTE — Assessment & Plan Note (Signed)
 Chronic, ongoing.  Recommend continue supplement daily.  Recheck level.

## 2023-11-07 NOTE — Assessment & Plan Note (Signed)
 Chronic, ongoing. A1c 8.6% today, trending up, and urine ALB 27 November 2023. He has been lost to follow-up since December, missed visits, and has been out of medication for 3 weeks.  Is concerned about Jardiance  cost.  New VBCI referral to talk with pharmacist and assist with adherence. - Continue all current medications and will look into assist options with pharmacist.  We discussed at length today the importance of adherence and risks if A1c remains >6.5%. - Will need to renal dose Metformin  if eGFR <45, monitor closely, discussed with patient.   - Did not tolerate Ozempic  in past.   - Recommend continue to check BS 2-3 times daily and document for visits.   - Eye exam needed.  Foot exam up to date. - Vaccinations up to date. - ACE and Statin on board. - Return in 3 months, highly recommend he maintain regular visits.

## 2023-11-08 ENCOUNTER — Ambulatory Visit: Payer: Self-pay | Admitting: Nurse Practitioner

## 2023-11-08 ENCOUNTER — Telehealth: Payer: Self-pay

## 2023-11-08 LAB — LIPID PANEL W/O CHOL/HDL RATIO
Cholesterol, Total: 219 mg/dL — ABNORMAL HIGH (ref 100–199)
HDL: 41 mg/dL (ref >39)
LDL Chol Calc (NIH): 150 mg/dL — ABNORMAL HIGH (ref 0–99)
Triglycerides: 156 mg/dL — ABNORMAL HIGH (ref 0–149)
VLDL Cholesterol Cal: 28 mg/dL (ref 5–40)

## 2023-11-08 LAB — COMPREHENSIVE METABOLIC PANEL WITH GFR
ALT: 33 IU/L (ref 0–44)
AST: 23 IU/L (ref 0–40)
Albumin: 4.3 g/dL (ref 3.8–4.8)
Alkaline Phosphatase: 75 IU/L (ref 44–121)
BUN/Creatinine Ratio: 15 (ref 10–24)
BUN: 20 mg/dL (ref 8–27)
Bilirubin Total: 0.2 mg/dL (ref 0.0–1.2)
CO2: 18 mmol/L — ABNORMAL LOW (ref 20–29)
Calcium: 9.1 mg/dL (ref 8.6–10.2)
Chloride: 103 mmol/L (ref 96–106)
Creatinine, Ser: 1.34 mg/dL — ABNORMAL HIGH (ref 0.76–1.27)
Globulin, Total: 2.4 g/dL (ref 1.5–4.5)
Glucose: 110 mg/dL — ABNORMAL HIGH (ref 70–99)
Potassium: 4.5 mmol/L (ref 3.5–5.2)
Sodium: 137 mmol/L (ref 134–144)
Total Protein: 6.7 g/dL (ref 6.0–8.5)
eGFR: 55 mL/min/1.73 — ABNORMAL LOW (ref >59)

## 2023-11-08 LAB — CBC WITH DIFFERENTIAL/PLATELET
Basophils Absolute: 0 x10E3/uL (ref 0.0–0.2)
Basos: 1 %
EOS (ABSOLUTE): 0.1 x10E3/uL (ref 0.0–0.4)
Eos: 2 %
Hematocrit: 46 % (ref 37.5–51.0)
Hemoglobin: 14.7 g/dL (ref 13.0–17.7)
Immature Grans (Abs): 0 x10E3/uL (ref 0.0–0.1)
Immature Granulocytes: 0 %
Lymphocytes Absolute: 1.6 x10E3/uL (ref 0.7–3.1)
Lymphs: 29 %
MCH: 28.8 pg (ref 26.6–33.0)
MCHC: 32 g/dL (ref 31.5–35.7)
MCV: 90 fL (ref 79–97)
Monocytes Absolute: 0.6 x10E3/uL (ref 0.1–0.9)
Monocytes: 10 %
Neutrophils Absolute: 3.2 x10E3/uL (ref 1.4–7.0)
Neutrophils: 58 %
Platelets: 249 x10E3/uL (ref 150–450)
RBC: 5.11 x10E6/uL (ref 4.14–5.80)
RDW: 12.9 % (ref 11.6–15.4)
WBC: 5.5 x10E3/uL (ref 3.4–10.8)

## 2023-11-08 LAB — PSA: Prostate Specific Ag, Serum: 0.2 ng/mL (ref 0.0–4.0)

## 2023-11-08 LAB — VITAMIN B12: Vitamin B-12: 815 pg/mL (ref 232–1245)

## 2023-11-08 LAB — TSH: TSH: 2.24 u[IU]/mL (ref 0.450–4.500)

## 2023-11-08 NOTE — Progress Notes (Signed)
 Care Guide Pharmacy Note  11/08/2023 Name: Matthew [name] MRN: [id number] DOB: Nov 22, 1945  Referred By: Valerio Melanie DASEN, NP Reason for referral: Complex Care Management (Outreach to schedule with Pharm d )   [name] is a 78 y.o. year old male who is a primary care patient of Cannady, Jolene T, NP.  Toribio LITTIE Ada was referred to the pharmacist for assistance related to: HTN and DMII  Successful contact was made with the patient to discuss pharmacy services including being ready for the pharmacist to call at least 5 minutes before the scheduled appointment time and to have medication bottles and any blood pressure readings ready for review. The patient agreed to meet with the pharmacist via telephone visit on (date/time).11/12/2023  Jeoffrey Buffalo , RMA     [place]  [facility], [facility] Guide  Direct Dial: [phone]  Website: [email or web address]

## 2023-11-08 NOTE — Progress Notes (Signed)
 Good morning, please let Matthew [name] know his labs have returned: - Kidney function continues to show Stage 3a kidney disease with no worsening.  Ensure good water intake daily and we need to get diabetes under control to prevent worsening kidney disease. - Lipid panel is showing elevations, please ensure you take your Atorvastatin  daily. - Remainder of labs are stable.  Any questions? Keep being amazing!!  Thank you for allowing me to participate in your care.  I appreciate you. Kindest regards, [name]

## 2023-11-12 ENCOUNTER — Other Ambulatory Visit: Payer: Self-pay

## 2023-11-12 DIAGNOSIS — E1169 Type 2 diabetes mellitus with other specified complication: Secondary | ICD-10-CM

## 2023-11-12 NOTE — Progress Notes (Unsigned)
 11/12/2023 Name: Matthew [name] MRN: [id number] DOB: 03/23/1945  Chief Complaint  Patient presents with   Diabetes Management Plan   Matthew [name] is a 78 y.o. year old male who presented for a telephone visit.   They were referred to the pharmacist by their PCP for assistance in managing diabetes.   Subjective:  Care Team: Primary Care Provider: Valerio Melanie DASEN, NP ; Next Scheduled Visit: 12/12  Medication Access/Adherence  Current Pharmacy:  [facility] - [place], [place] - [address] [place] [address] Phone: [phone] Fax: [phone]  -Patient reports affordability concerns with their medications: Yes  -Patient reports access/transportation concerns to their pharmacy: No  -Patient reports adherence concerns with their medications:  Yes    Diabetes: Current medications: Lantus  50 units at bedtime, Jardiance  12.5mg  daily, metformin  1000mg  BID -Medications tried in the past: Ozempic  caused swelling -Current glucose readings: patient does not monitor regularly but did check FBG before visit with PCP last week and states this was 134 -Patient denies hypoglycemic s/sx including dizziness, shakiness, sweating.  -Patient denies hyperglycemic symptoms including polyuria, polydipsia, polyphagia, nocturia, neuropathy, blurred vision. -A1c was elevated at PCP visit last week, but patient states he had been without diabetes medications for approximately 3 weeks prior due to not having a current follow-up visit -Patient also endorses eating more sandwiches/bread in the summertime but does state he is moderately active doing yard work outside and volunteering -Jardiance  25mg  is expensive at >$100 per month to refill, so patient is currently splitting tablets to take 12.5mg  daily  Macrovascular and Microvascular Risk Reduction:  Statin? yes (atorvastatin  20mg  daily); ACEi/ARB? yes (benazepril  40mg  daily) Last urinary albumin/creatinine ratio:  Lab  Results  Component Value Date   MICRALBCREAT <30 11/07/2023   MICRALBCREAT 30-300 (H) 03/27/2022   MICRALBCREAT >300 (H) 04/22/2021   MICRALBCREAT <30 01/31/2021   MICRALBCREAT <30 07/21/2020   MICRALBCREAT 30-300 (H) 03/19/2019   MICRALBCREAT <30 12/30/2014   Last eye exam:  Lab Results  Component Value Date   HMDIABEYEEXA No Retinopathy 03/27/2023   Last foot exam: 09/22/2021 Tobacco Use:  Tobacco Use: Low Risk  (11/07/2023)   Patient History    Smoking Tobacco Use: Never    Smokeless Tobacco Use: Never    Passive Exposure: Not on file   Objective:  Lab Results  Component Value Date   HGBA1C 8.6 (H) 11/07/2023   Lab Results  Component Value Date   CREATININE 1.34 (H) 11/07/2023   BUN 20 11/07/2023   NA 137 11/07/2023   K 4.5 11/07/2023   CL 103 11/07/2023   CO2 18 (L) 11/07/2023   Lab Results  Component Value Date   CHOL 219 (H) 11/07/2023   HDL 41 11/07/2023   LDLCALC 150 (H) 11/07/2023   TRIG 156 (H) 11/07/2023   CHOLHDL 3.3 04/22/2021   Medications Reviewed Today     Reviewed by Deanna Channing LABOR, RPH (Pharmacist) on 11/12/23 at 1619  Med List Status: <None>   Medication Order Taking? Sig Documenting Provider Last Dose Status Informant  aspirin  EC 81 MG tablet [id number] Yes Take 81 mg by mouth daily. Swallow whole. [provider]  Active Spouse/Significant Other  atorvastatin  (LIPITOR) 20 MG tablet [id number] Yes Take 1 tablet (20 mg total) by mouth daily. Cannady, Jolene T, NP  Active   benazepril  (LOTENSIN ) 40 MG tablet [id number] Yes Take 1 tablet (40 mg total) by mouth daily. Cannady, Jolene T, NP  Active  empagliflozin  (JARDIANCE ) 25 MG TABS tablet [id number] Yes Take 1 tablet (25 mg total) by mouth daily.  Patient taking differently: Take 12.5 mg by mouth daily.   Cannady, Jolene T, NP  Active   insulin  glargine (LANTUS  SOLOSTAR) 100 UNIT/ML Solostar Pen [id number] Yes Inject 50 Units into the skin at bedtime. Valerio Moris T, NP  Active    Insulin  Pen Needle 31G X 8 MM MISC [id number] Yes Use one needle to administer insulin  into skin nightly. Cannady, Jolene T, NP  Active   metFORMIN  (GLUCOPHAGE ) 1000 MG tablet [id number] Yes Take 1 tablet (1,000 mg total) by mouth 2 (two) times daily with a meal. Cannady, Jolene T, NP  Active   Multiple Vitamins-Minerals (CENTRUM SILVER 50+MEN) TABS [id number]  Take 1 tablet by mouth daily. [provider]  Active Spouse/Significant Other  mupirocin  ointment (BACTROBAN ) 2 % [id number]  Apply 1 Application topically 2 (two) times daily. Cannady, Jolene T, NP  Active   vitamin B-12 (CYANOCOBALAMIN ) 500 MCG tablet [id number]  Take 500 mcg by mouth daily. [provider]  Active Spouse/Significant Other           Assessment/Plan:   Diabetes: -Currently uncontrolled; goal A1c <7%. Cardiorenal risk reduction is optimized.. Blood pressure is not at goal <130/80. LDL is not at goal.  -Patient does not qualify for LIS Medicare Extra Help based on assets.  I was hoping to enroll him in a [employer] grant to assist with copay for Jardiance , but he also does not qualify for this; open grant is only for patients with cardiomyopathy. -Patient was informed by social security that beginning in January 2026, Jardiance - like Lantus - will be capped at $35/month; so he does not wish to pursue any PAP at this time.  Upon researching further, this medication will not be capped, but the negotiated cost for 2026 will be 66% less than it is currently. -While splitting Jardiance  is not recommended due to inconsistent dosing, it is does not pose harm -Continue current regimen at this time; I anticipate A1c to improve with all diabetes medications back on board and anticipated decrease in carbohydrate consumption -Monitor and record FBG at least 3x/week -Patient sees PCP again 12/12 and will be due for follow-up A1c.  If >7%, could consider addition of meal time insulin  or sulfonylurea at least until patient  can resume taking Jardiance  25mg  daily.  I also recommend a follow-up lipid panel and increasing atorvastatin  to 40mg  daily if LDL >70.  Follow Up Plan: 6 weeks  Channing DELENA Mealing, PharmD, DPLA

## 2023-11-15 ENCOUNTER — Other Ambulatory Visit

## 2023-12-17 ENCOUNTER — Telehealth: Payer: Self-pay | Admitting: Nurse Practitioner

## 2023-12-17 NOTE — Telephone Encounter (Signed)
 Prescription Request  12/17/2023  LOV: 11/07/2023  What is the name of the medication or equipment? nsulin Pen Needle 31G X 8 MM MISC  Patient also request Alcohol Wipes   Have you contacted your pharmacy to request a refill? No   Which pharmacy would you like this sent to?  [facility] - [place], [place] - [address] [place] [address] Phone: [phone] Fax: [phone]    Patient notified that their request is being sent to the clinical staff for review and that they should receive a response within 2 business days.   Please advise at Mobile [phone] (mobile)

## 2023-12-18 MED ORDER — INSULIN PEN NEEDLE 31G X 8 MM MISC: 3 refills | Status: AC

## 2023-12-18 MED ORDER — ALCOHOL WIPES 70 % EX MISC: CUTANEOUS | 5 refills | Status: AC

## 2023-12-29 NOTE — Progress Notes (Unsigned)
 12/29/2023 Name: Matthew [name] MRN: [id number] DOB: [birth year]  No chief complaint on file.  Matthew [name] is a 78 y.o. year old male who presented for a telephone visit.   They were referred to the pharmacist by their PCP for assistance in managing diabetes.   Subjective:  Care Team: Primary Care Provider: Valerio Melanie DASEN, NP ; Next Scheduled Visit: 12/12  Medication Access/Adherence  Current Pharmacy:  [facility] - [place], [place] - [address] [place] [address] Phone: [phone] Fax: [phone]  -Patient reports affordability concerns with their medications: Yes  -Patient reports access/transportation concerns to their pharmacy: No  -Patient reports adherence concerns with their medications:  Yes    Diabetes: Current medications: Lantus  50 units at bedtime, Jardiance  12.5mg  daily, metformin  1000mg  BID -Medications tried in the past: Ozempic  caused swelling -Current glucose readings: patient does not monitor regularly but did check FBG before visit with PCP last week and states this was 134 -Patient denies hypoglycemic s/sx including dizziness, shakiness, sweating.  -Patient denies hyperglycemic symptoms including polyuria, polydipsia, polyphagia, nocturia, neuropathy, blurred vision. -A1c was elevated at PCP visit last week, but patient states he had been without diabetes medications for approximately 3 weeks prior due to not having a current follow-up visit -Patient also endorses eating more sandwiches/bread in the summertime but does state he is moderately active doing yard work outside and volunteering -Jardiance  25mg  is expensive at >$100 per month to refill, so patient is currently splitting tablets to take 12.5mg  daily  Macrovascular and Microvascular Risk Reduction:  Statin? yes (atorvastatin  20mg  daily); ACEi/ARB? yes (benazepril  40mg  daily) Last urinary albumin/creatinine ratio:  Lab Results  Component Value Date    MICRALBCREAT <30 11/07/2023   MICRALBCREAT 30-300 (H) 03/27/2022   MICRALBCREAT >300 (H) 04/22/2021   MICRALBCREAT <30 01/31/2021   MICRALBCREAT <30 07/21/2020   MICRALBCREAT 30-300 (H) 03/19/2019   MICRALBCREAT <30 12/30/2014   Last eye exam:  Lab Results  Component Value Date   HMDIABEYEEXA No Retinopathy 03/27/2023   Last foot exam: 09/22/2021 Tobacco Use:  Tobacco Use: Low Risk  (11/07/2023)   Patient History    Smoking Tobacco Use: Never    Smokeless Tobacco Use: Never    Passive Exposure: Not on file   Objective:  Lab Results  Component Value Date   HGBA1C 8.6 (H) 11/07/2023   Lab Results  Component Value Date   CREATININE 1.34 (H) 11/07/2023   BUN 20 11/07/2023   NA 137 11/07/2023   K 4.5 11/07/2023   CL 103 11/07/2023   CO2 18 (L) 11/07/2023   Lab Results  Component Value Date   CHOL 219 (H) 11/07/2023   HDL 41 11/07/2023   LDLCALC 150 (H) 11/07/2023   TRIG 156 (H) 11/07/2023   CHOLHDL 3.3 04/22/2021   Medications Reviewed Today   Medications were not reviewed in this encounter    Assessment/Plan:   Diabetes: -Currently uncontrolled; goal A1c <7%. Cardiorenal risk reduction is optimized.. Blood pressure is not at goal <130/80. LDL is not at goal.  -Patient does not qualify for LIS Medicare Extra Help based on assets.  I was hoping to enroll him in a [employer] grant to assist with copay for Jardiance , but he also does not qualify for this; open grant is only for patients with cardiomyopathy. -Patient was informed by social security that beginning in January 2026, Jardiance - like Lantus - will be capped at $35/month; so he does not wish to  pursue any PAP at this time.  Upon researching further, this medication will not be capped, but the negotiated cost for 2026 will be 66% less than it is currently. -While splitting Jardiance  is not recommended due to inconsistent dosing, it is does not pose harm -Continue current regimen at this time; I anticipate A1c to  improve with all diabetes medications back on board and anticipated decrease in carbohydrate consumption -Monitor and record FBG at least 3x/week -Patient sees PCP again 12/12 and will be due for follow-up A1c.  If >7%, could consider addition of meal time insulin  or sulfonylurea at least until patient can resume taking Jardiance  25mg  daily.  I also recommend a follow-up lipid panel and increasing atorvastatin  to 40mg  daily if LDL >70.  Follow Up Plan: 6 weeks  Channing DELENA Mealing, PharmD, DPLA

## 2023-12-31 ENCOUNTER — Other Ambulatory Visit: Payer: Self-pay

## 2024-01-08 NOTE — Progress Notes (Unsigned)
 01/08/2024 Name: Matthew [name] MRN: [id number] DOB: [birth year]  No chief complaint on file.  Matthew [name] is a 78 y.o. year old male who presented for a telephone visit.   They were referred to the pharmacist by their PCP for assistance in managing diabetes.   Subjective:  Care Team: Primary Care Provider: Valerio Melanie DASEN, NP ; Next Scheduled Visit: 12/12  Medication Access/Adherence  Current Pharmacy:  [facility] - [place], [place] - [address] [place] [address] Phone: [phone] Fax: [phone]  -Patient reports affordability concerns with their medications: Yes  -Patient reports access/transportation concerns to their pharmacy: No  -Patient reports adherence concerns with their medications:  Yes    Diabetes: Current medications: Lantus  50 units at bedtime, Jardiance  12.5mg  daily, metformin  1000mg  BID -Medications tried in the past: Ozempic  caused swelling -Current glucose readings: patient does not monitor regularly but did check FBG before visit with PCP last week and states this was 134 -Patient denies hypoglycemic s/sx including dizziness, shakiness, sweating.  -Patient denies hyperglycemic symptoms including polyuria, polydipsia, polyphagia, nocturia, neuropathy, blurred vision. -A1c was elevated at PCP visit last week, but patient states he had been without diabetes medications for approximately 3 weeks prior due to not having a current follow-up visit -Patient also endorses eating more sandwiches/bread in the summertime but does state he is moderately active doing yard work outside and volunteering -Jardiance  25mg  is expensive at >$100 per month to refill, so patient is currently splitting tablets to take 12.5mg  daily  Macrovascular and Microvascular Risk Reduction:  Statin? yes (atorvastatin  20mg  daily); ACEi/ARB? yes (benazepril  40mg  daily) Last urinary albumin/creatinine ratio:  Lab Results  Component Value Date    MICRALBCREAT <30 11/07/2023   MICRALBCREAT 30-300 (H) 03/27/2022   MICRALBCREAT >300 (H) 04/22/2021   MICRALBCREAT <30 01/31/2021   MICRALBCREAT <30 07/21/2020   MICRALBCREAT 30-300 (H) 03/19/2019   MICRALBCREAT <30 12/30/2014   Last eye exam:  Lab Results  Component Value Date   HMDIABEYEEXA No Retinopathy 03/27/2023   Last foot exam: 09/22/2021 Tobacco Use:  Tobacco Use: Low Risk  (11/07/2023)   Patient History    Smoking Tobacco Use: Never    Smokeless Tobacco Use: Never    Passive Exposure: Not on file   Objective:  Lab Results  Component Value Date   HGBA1C 8.6 (H) 11/07/2023   Lab Results  Component Value Date   CREATININE 1.34 (H) 11/07/2023   BUN 20 11/07/2023   NA 137 11/07/2023   K 4.5 11/07/2023   CL 103 11/07/2023   CO2 18 (L) 11/07/2023   Lab Results  Component Value Date   CHOL 219 (H) 11/07/2023   HDL 41 11/07/2023   LDLCALC 150 (H) 11/07/2023   TRIG 156 (H) 11/07/2023   CHOLHDL 3.3 04/22/2021   Medications Reviewed Today   Medications were not reviewed in this encounter    Assessment/Plan:   Diabetes: -Currently uncontrolled; goal A1c <7%. Cardiorenal risk reduction is optimized.. Blood pressure is not at goal <130/80. LDL is not at goal.  -Patient does not qualify for LIS Medicare Extra Help based on assets.  I was hoping to enroll him in a [employer] grant to assist with copay for Jardiance , but he also does not qualify for this; open grant is only for patients with cardiomyopathy. -Patient was informed by social security that beginning in January 2026, Jardiance - like Lantus - will be capped at $35/month; so he does not wish to  pursue any PAP at this time.  Upon researching further, this medication will not be capped, but the negotiated cost for 2026 will be 66% less than it is currently. -While splitting Jardiance  is not recommended due to inconsistent dosing, it is does not pose harm -Continue current regimen at this time; I anticipate A1c to  improve with all diabetes medications back on board and anticipated decrease in carbohydrate consumption -Monitor and record FBG at least 3x/week -Patient sees PCP again 12/12 and will be due for follow-up A1c.  If >7%, could consider addition of meal time insulin  or sulfonylurea at least until patient can resume taking Jardiance  25mg  daily.  I also recommend a follow-up lipid panel and increasing atorvastatin  to 40mg  daily if LDL >70.  Follow Up Plan: 6 weeks  Channing DELENA Mealing, PharmD, DPLA

## 2024-01-09 ENCOUNTER — Other Ambulatory Visit: Payer: Self-pay

## 2024-01-17 DIAGNOSIS — L820 Inflamed seborrheic keratosis: Secondary | ICD-10-CM | POA: Diagnosis not present

## 2024-01-17 DIAGNOSIS — L821 Other seborrheic keratosis: Secondary | ICD-10-CM | POA: Diagnosis not present

## 2024-01-17 DIAGNOSIS — L859 Epidermal thickening, unspecified: Secondary | ICD-10-CM | POA: Diagnosis not present

## 2024-01-17 DIAGNOSIS — D1801 Hemangioma of skin and subcutaneous tissue: Secondary | ICD-10-CM | POA: Diagnosis not present

## 2024-01-17 DIAGNOSIS — L814 Other melanin hyperpigmentation: Secondary | ICD-10-CM | POA: Diagnosis not present

## 2024-01-17 DIAGNOSIS — L918 Other hypertrophic disorders of the skin: Secondary | ICD-10-CM | POA: Diagnosis not present

## 2024-01-17 DIAGNOSIS — L281 Prurigo nodularis: Secondary | ICD-10-CM | POA: Diagnosis not present

## 2024-01-17 DIAGNOSIS — L570 Actinic keratosis: Secondary | ICD-10-CM | POA: Diagnosis not present

## 2024-01-27 IMAGING — DX DG CHEST 2V
3 series · 3 of 3 positions shown · non-contrast
Comparison: 03/17/2021.

CLINICAL DATA: ? pna / fluid

EXAM:
CHEST - 2 VIEW

[chest pa (1 of 2)]
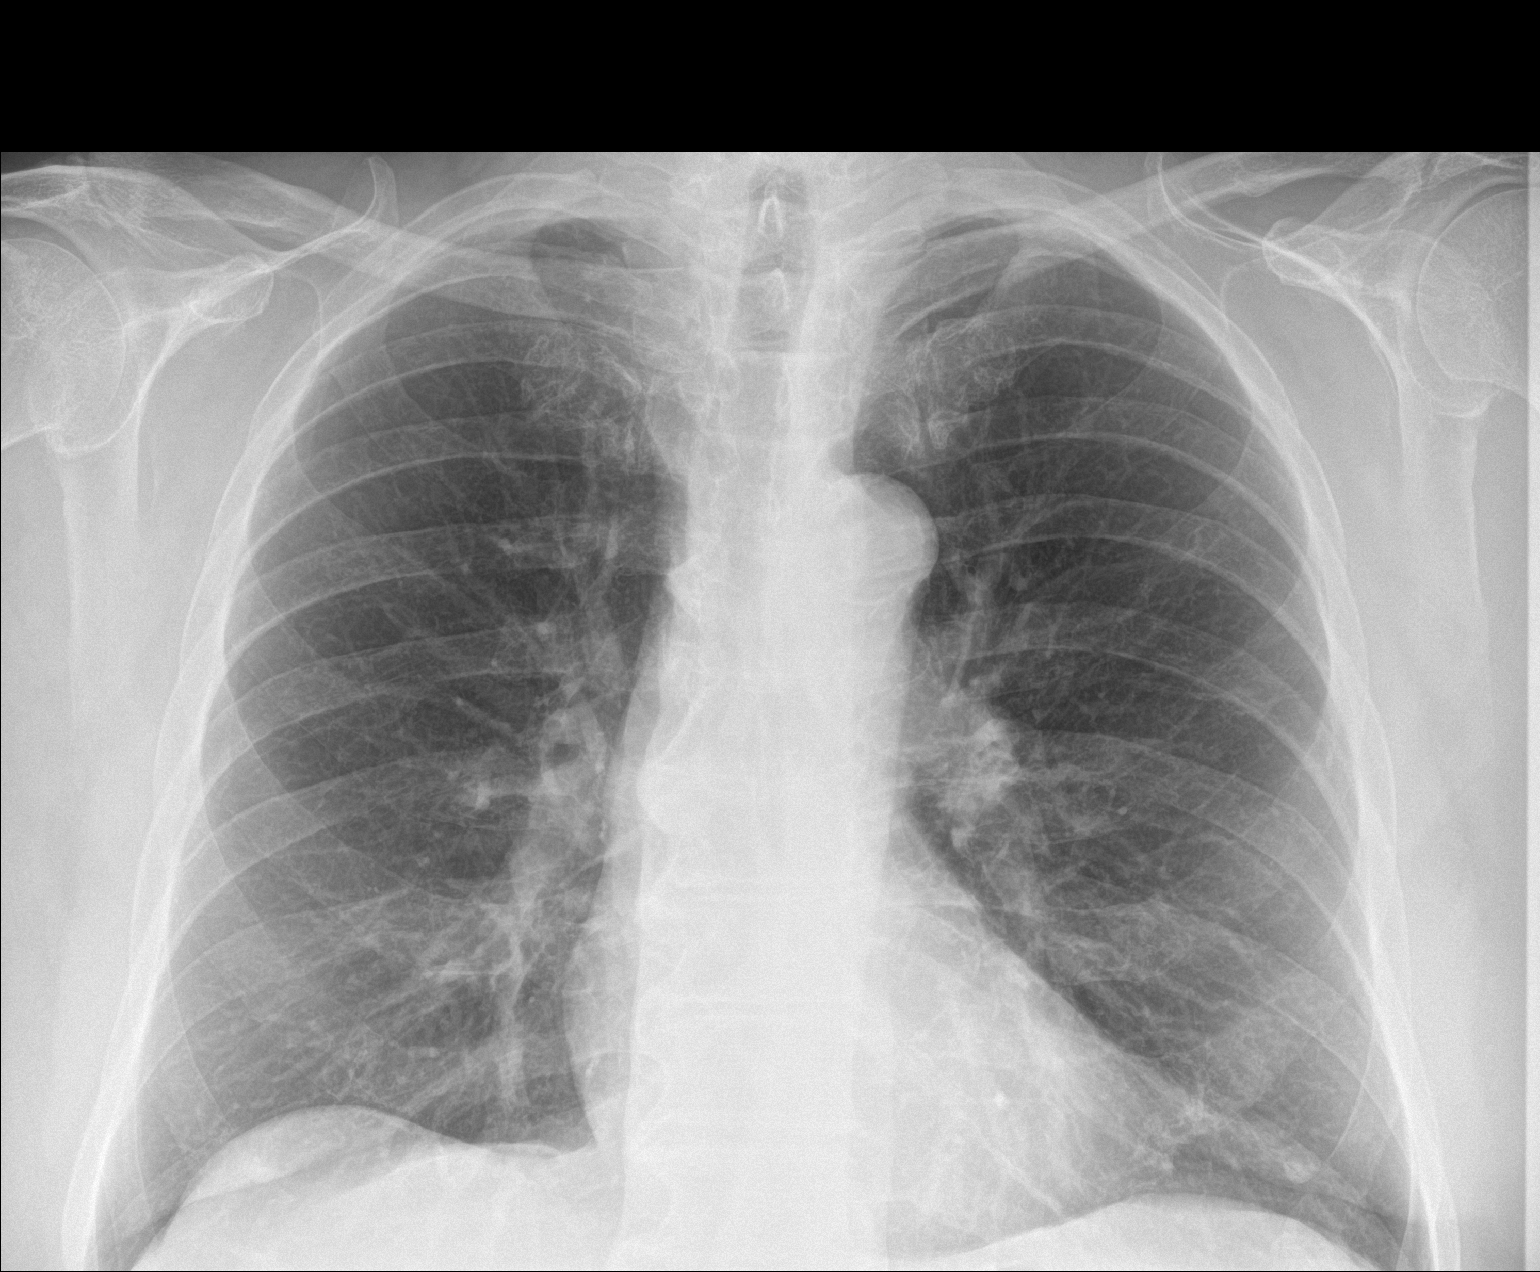

[chest lat]
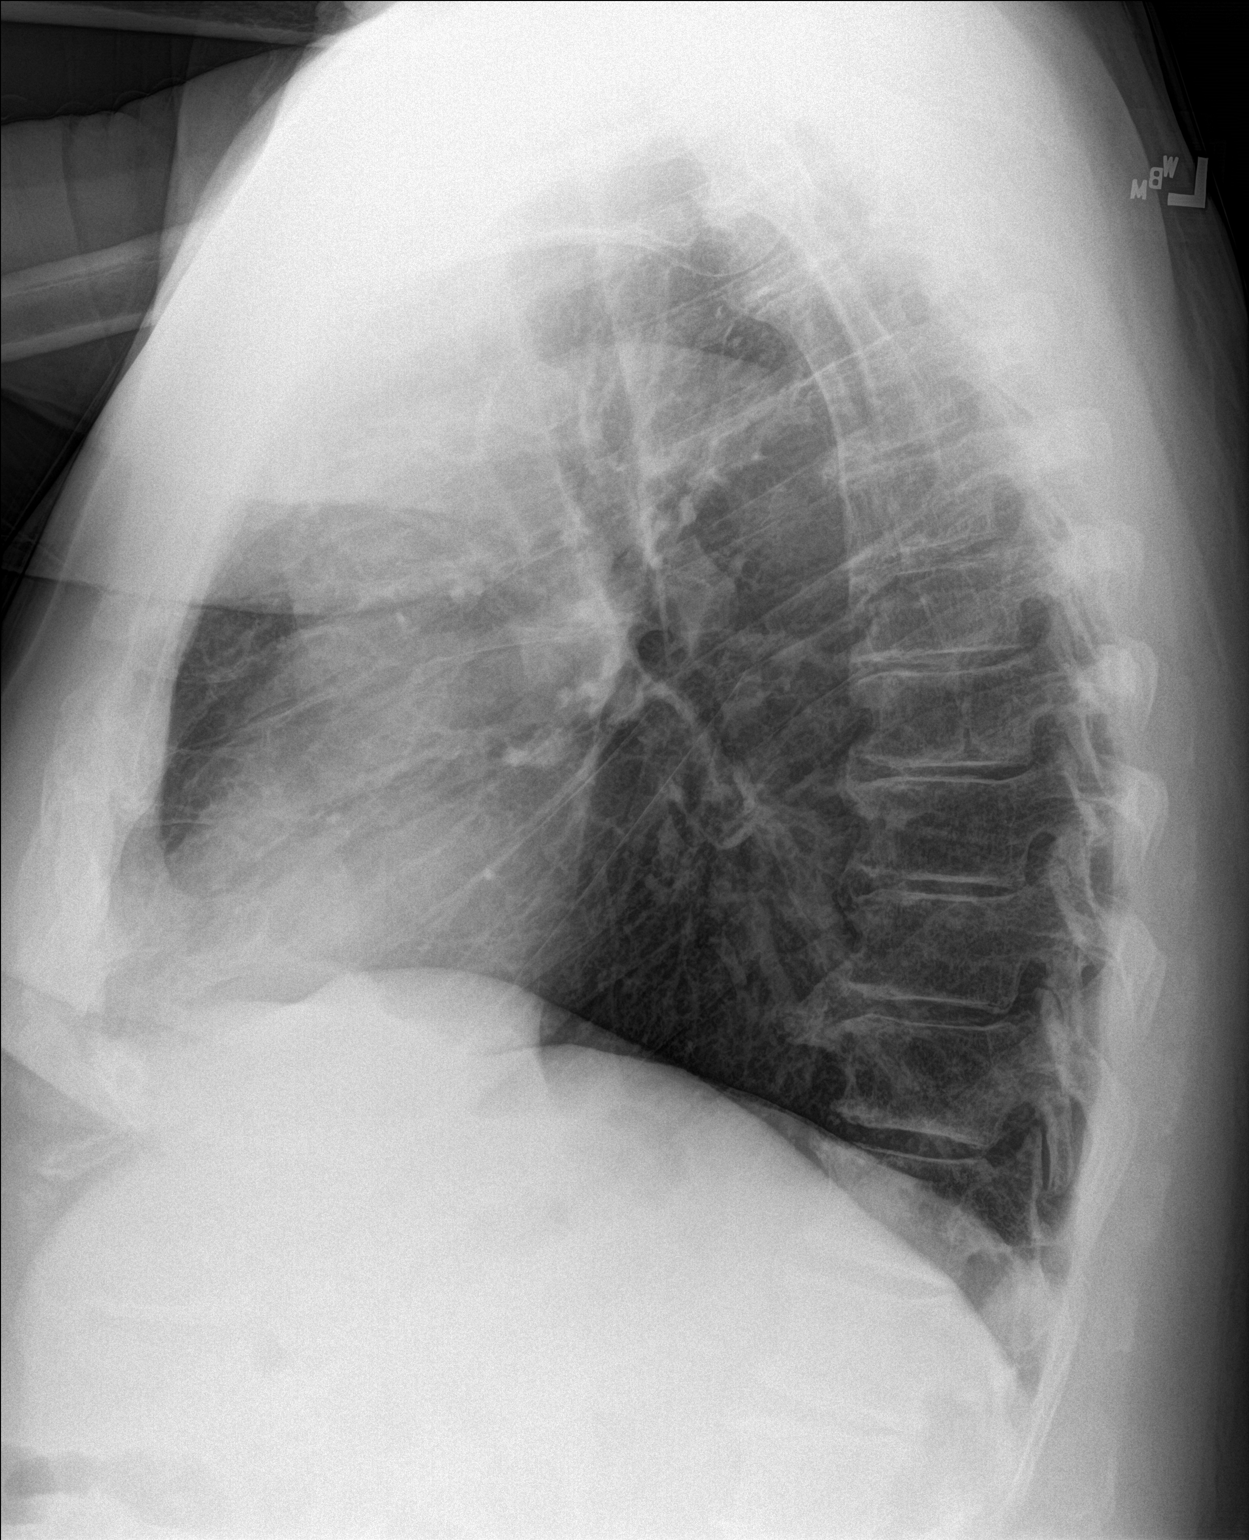

[chest pa (2 of 2)]
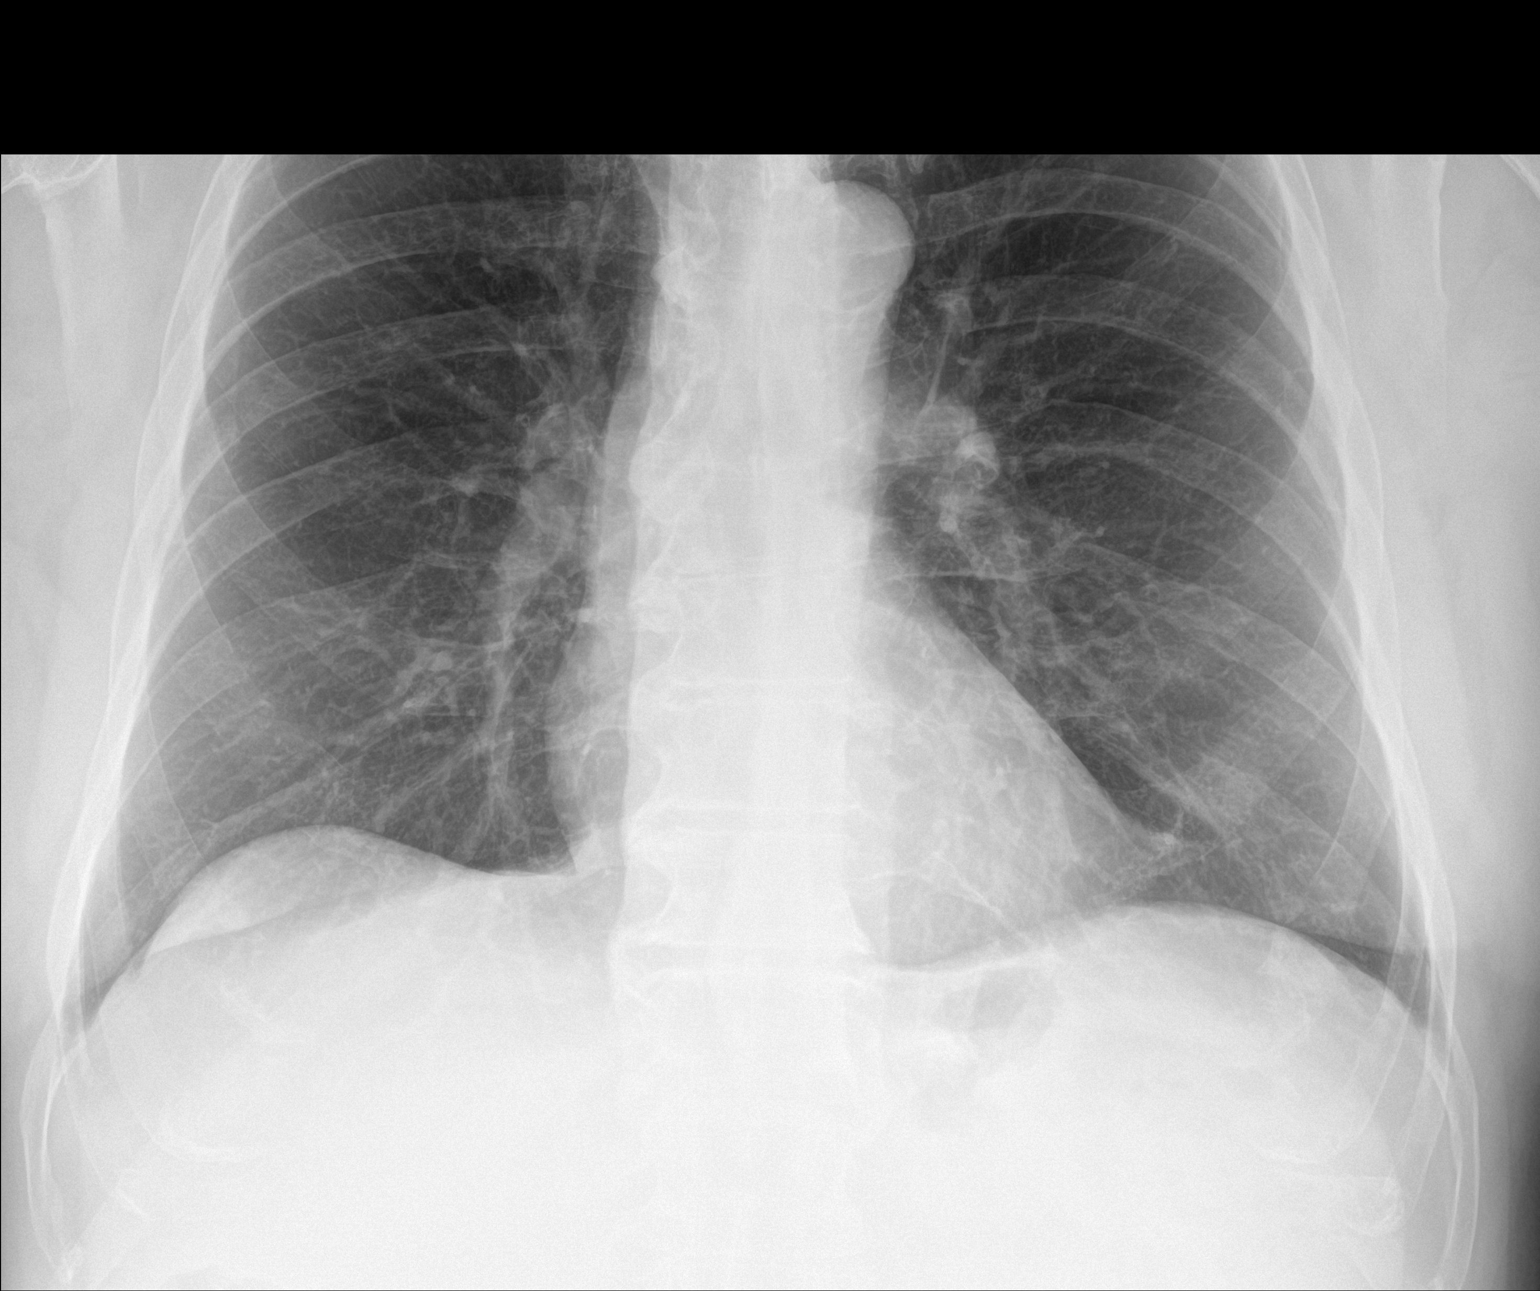

[3 of 3 positions shown; findings below may reference images not displayed]

FINDINGS: No consolidation. No visible pleural effusions or pneumothorax.
Cardiomediastinal silhouette is within normal limits. No change from
prior.
IMPRESSION: No evidence of acute cardiopulmonary disease.

## 2024-01-27 NOTE — Patient Instructions (Incomplete)
 Be Involved in Caring For Your Health:  Taking Medications When medications are taken as directed, they can greatly improve your health. But if they are not taken as prescribed, they may not work. In some cases, not taking them correctly can be harmful. To help ensure your treatment remains effective and safe, understand your medications and how to take them. Bring your medications to each visit for review by your provider.  Your lab results, notes, and after visit summary will be available on My Chart. We strongly encourage you to use this feature. If lab results are abnormal the clinic will contact you with the appropriate steps. If the clinic does not contact you assume the results are satisfactory. You can always view your results on My Chart. If you have questions regarding your health or results, please contact the clinic during office hours. You can also ask questions on My Chart.  We at [facility] are grateful that you chose [place] to provide your care. We strive to provide evidence-based and compassionate care and are always looking for feedback. If you get a survey from the clinic please complete this so we can hear your opinions.  Diabetes Mellitus and Foot Care Diabetes, also called diabetes mellitus, may cause problems with your feet and legs because of poor blood flow (circulation). Poor circulation may make your skin: Become thinner and drier. Break more easily. Heal more slowly. Peel and crack. You may also have nerve damage (neuropathy). This can cause decreased feeling in your legs and feet. This means that you may not notice minor injuries to your feet that could lead to more serious problems. Finding and treating problems early is the best way to prevent future foot problems. How to care for your feet Foot hygiene  Wash your feet daily with warm water and mild soap. Do not use hot water. Then, pat your feet and the areas between your toes until they are fully dry. Do  not soak your feet. This can dry your skin. Trim your toenails straight across. Do not dig under them or around the cuticle. File the edges of your nails with an emery board or nail file. Apply a moisturizing lotion or petroleum jelly to the skin on your feet and to dry, brittle toenails. Use lotion that does not contain alcohol and is unscented. Do not apply lotion between your toes. Shoes and socks Wear clean socks or stockings every day. Make sure they are not too tight. Do not wear knee-high stockings. These may decrease blood flow to your legs. Wear shoes that fit well and have enough cushioning. Always look in your shoes before you put them on to be sure there are no objects inside. To break in new shoes, wear them for just a few hours a day. This prevents injuries on your feet. Wounds, scrapes, corns, and calluses  Check your feet daily for blisters, cuts, bruises, sores, and redness. If you cannot see the bottom of your feet, use a mirror or ask someone for help. Do not cut off corns or calluses or try to remove them with medicine. If you find a minor scrape, cut, or break in the skin on your feet, keep it and the skin around it clean and dry. You may clean these areas with mild soap and water. Do not clean the area with peroxide, alcohol, or iodine. If you have a wound, scrape, corn, or callus on your foot, look at it several times a day to make sure it  is healing and not infected. Check for: Redness, swelling, or pain. Fluid or blood. Warmth. Pus or a bad smell. General tips Do not cross your legs. This may decrease blood flow to your feet. Do not use heating pads or hot water bottles on your feet. They may burn your skin. If you have lost feeling in your feet or legs, you may not know this is happening until it is too late. Protect your feet from hot and cold by wearing shoes, such as at the beach or on hot pavement. Schedule a complete foot exam at least once a year or more often if  you have foot problems. Report any cuts, sores, or bruises to your health care provider right away. Where to find more information American Diabetes Association: diabetes.org Association of Diabetes Care & Education Specialists: diabeteseducator.org Contact a health care provider if: You have a condition that increases your risk of infection, and you have any cuts, sores, or bruises on your feet. You have an injury that is not healing. You have redness on your legs or feet. You feel burning or tingling in your legs or feet. You have pain or cramps in your legs and feet. Your legs or feet are numb. Your feet always feel cold. You have pain around any toenails. Get help right away if: You have a wound, scrape, corn, or callus on your foot and: You have signs of infection. You have a fever. You have a red line going up your leg. This information is not intended to replace advice given to you by your health care provider. Make sure you discuss any questions you have with your health care provider. Document Revised: 08/17/2021 Document Reviewed: 08/17/2021 Elsevier Patient Education  2024 [employer].

## 2024-02-08 ENCOUNTER — Ambulatory Visit: Admitting: Nurse Practitioner

## 2024-02-08 DIAGNOSIS — Z23 Encounter for immunization: Secondary | ICD-10-CM

## 2024-02-08 DIAGNOSIS — E119 Type 2 diabetes mellitus without complications: Secondary | ICD-10-CM

## 2024-02-08 DIAGNOSIS — I152 Hypertension secondary to endocrine disorders: Secondary | ICD-10-CM

## 2024-02-08 DIAGNOSIS — E66811 Obesity, class 1: Secondary | ICD-10-CM

## 2024-02-08 DIAGNOSIS — E1165 Type 2 diabetes mellitus with hyperglycemia: Secondary | ICD-10-CM

## 2024-02-08 DIAGNOSIS — N183 Chronic kidney disease, stage 3 unspecified: Secondary | ICD-10-CM

## 2024-02-08 DIAGNOSIS — E1169 Type 2 diabetes mellitus with other specified complication: Secondary | ICD-10-CM

## 2024-03-20 ENCOUNTER — Ambulatory Visit
